# Patient Record
Sex: Female | Born: 1935 | Race: White | Hispanic: No | Smoking: Never smoker
Health system: Southern US, Community
[De-identification: ages and names within clinical notes are randomized; demographics above are authoritative.]

## PROBLEM LIST (undated history)

## (undated) DIAGNOSIS — H409 Unspecified glaucoma: Secondary | ICD-10-CM

## (undated) DIAGNOSIS — I219 Acute myocardial infarction, unspecified: Secondary | ICD-10-CM

## (undated) DIAGNOSIS — I639 Cerebral infarction, unspecified: Secondary | ICD-10-CM

## (undated) DIAGNOSIS — K219 Gastro-esophageal reflux disease without esophagitis: Secondary | ICD-10-CM

## (undated) DIAGNOSIS — G4730 Sleep apnea, unspecified: Secondary | ICD-10-CM

## (undated) DIAGNOSIS — R011 Cardiac murmur, unspecified: Secondary | ICD-10-CM

## (undated) DIAGNOSIS — F0280 Dementia in other diseases classified elsewhere without behavioral disturbance: Secondary | ICD-10-CM

## (undated) DIAGNOSIS — H269 Unspecified cataract: Secondary | ICD-10-CM

## (undated) DIAGNOSIS — M1990 Unspecified osteoarthritis, unspecified site: Secondary | ICD-10-CM

## (undated) DIAGNOSIS — R569 Unspecified convulsions: Secondary | ICD-10-CM

## (undated) DIAGNOSIS — T7840XA Allergy, unspecified, initial encounter: Secondary | ICD-10-CM

## (undated) HISTORY — PX: BREAST BIOPSY: SHX20

## (undated) HISTORY — DX: Acute myocardial infarction, unspecified: I21.9

## (undated) HISTORY — DX: Gastro-esophageal reflux disease without esophagitis: K21.9

## (undated) HISTORY — DX: Sleep apnea, unspecified: G47.30

## (undated) HISTORY — DX: Unspecified osteoarthritis, unspecified site: M19.90

## (undated) HISTORY — DX: Cerebral infarction, unspecified: I63.9

## (undated) HISTORY — DX: Allergy, unspecified, initial encounter: T78.40XA

## (undated) HISTORY — DX: Unspecified glaucoma: H40.9

## (undated) HISTORY — DX: Unspecified cataract: H26.9

## (undated) HISTORY — DX: Unspecified convulsions: R56.9

## (undated) HISTORY — DX: Cardiac murmur, unspecified: R01.1

---

## 2000-01-15 ENCOUNTER — Other Ambulatory Visit: Admission: RE | Admit: 2000-01-15 | Discharge: 2000-01-15 | Payer: Self-pay | Admitting: Podiatry

## 2004-12-27 ENCOUNTER — Ambulatory Visit: Payer: Self-pay | Admitting: Internal Medicine

## 2005-04-16 ENCOUNTER — Ambulatory Visit: Payer: Self-pay | Admitting: Otolaryngology

## 2005-12-31 ENCOUNTER — Ambulatory Visit: Payer: Self-pay | Admitting: Internal Medicine

## 2006-06-04 ENCOUNTER — Other Ambulatory Visit: Payer: Self-pay

## 2006-06-04 ENCOUNTER — Emergency Department: Payer: Self-pay | Admitting: Emergency Medicine

## 2006-06-05 ENCOUNTER — Ambulatory Visit: Payer: Self-pay | Admitting: Internal Medicine

## 2006-06-26 ENCOUNTER — Ambulatory Visit: Payer: Self-pay | Admitting: Internal Medicine

## 2006-12-16 ENCOUNTER — Ambulatory Visit: Payer: Self-pay | Admitting: Otolaryngology

## 2007-01-16 ENCOUNTER — Ambulatory Visit: Payer: Self-pay | Admitting: Internal Medicine

## 2007-02-05 ENCOUNTER — Ambulatory Visit: Payer: Self-pay | Admitting: Internal Medicine

## 2007-02-11 ENCOUNTER — Ambulatory Visit: Payer: Self-pay | Admitting: Internal Medicine

## 2008-02-23 ENCOUNTER — Ambulatory Visit: Payer: Self-pay | Admitting: Internal Medicine

## 2008-06-08 ENCOUNTER — Emergency Department: Payer: Self-pay | Admitting: Emergency Medicine

## 2009-03-09 ENCOUNTER — Ambulatory Visit: Payer: Self-pay | Admitting: Internal Medicine

## 2010-03-13 ENCOUNTER — Ambulatory Visit: Payer: Self-pay | Admitting: Internal Medicine

## 2011-04-16 ENCOUNTER — Ambulatory Visit: Payer: Self-pay | Admitting: Internal Medicine

## 2011-12-04 ENCOUNTER — Ambulatory Visit: Payer: Self-pay | Admitting: Ophthalmology

## 2012-01-22 ENCOUNTER — Ambulatory Visit: Payer: Self-pay | Admitting: Gastroenterology

## 2012-04-08 ENCOUNTER — Ambulatory Visit: Payer: Self-pay | Admitting: Ophthalmology

## 2012-04-16 ENCOUNTER — Ambulatory Visit: Payer: Self-pay | Admitting: Internal Medicine

## 2013-04-19 ENCOUNTER — Ambulatory Visit: Payer: Self-pay | Admitting: Internal Medicine

## 2013-05-06 ENCOUNTER — Ambulatory Visit: Payer: Self-pay | Admitting: Gastroenterology

## 2013-07-30 ENCOUNTER — Ambulatory Visit: Payer: Self-pay | Admitting: Internal Medicine

## 2013-08-02 ENCOUNTER — Ambulatory Visit: Payer: Self-pay | Admitting: Gastroenterology

## 2013-08-05 LAB — PATHOLOGY REPORT

## 2014-01-12 ENCOUNTER — Ambulatory Visit: Payer: Self-pay | Admitting: Internal Medicine

## 2014-04-20 ENCOUNTER — Ambulatory Visit: Payer: Self-pay | Admitting: Internal Medicine

## 2014-05-06 ENCOUNTER — Ambulatory Visit: Payer: Self-pay | Admitting: Gastroenterology

## 2014-07-22 ENCOUNTER — Ambulatory Visit
Admit: 2014-07-22 | Disposition: A | Discharge status: Home / Self Care | Payer: Self-pay | Attending: Internal Medicine | Admitting: Internal Medicine

## 2014-08-09 ENCOUNTER — Other Ambulatory Visit: Payer: Self-pay | Admitting: Nurse Practitioner

## 2014-08-09 DIAGNOSIS — K219 Gastro-esophageal reflux disease without esophagitis: Secondary | ICD-10-CM

## 2014-08-24 ENCOUNTER — Ambulatory Visit
Admission: RE | Admit: 2014-08-24 | Discharge: 2014-08-24 | Disposition: A | Discharge status: Home / Self Care | Payer: PPO | Source: Ambulatory Visit | Attending: Nurse Practitioner | Admitting: Nurse Practitioner

## 2014-08-24 DIAGNOSIS — K219 Gastro-esophageal reflux disease without esophagitis: Secondary | ICD-10-CM

## 2014-08-24 DIAGNOSIS — K449 Diaphragmatic hernia without obstruction or gangrene: Secondary | ICD-10-CM | POA: Insufficient documentation

## 2014-08-24 DIAGNOSIS — R0989 Other specified symptoms and signs involving the circulatory and respiratory systems: Secondary | ICD-10-CM | POA: Diagnosis present

## 2015-03-23 ENCOUNTER — Other Ambulatory Visit: Payer: Self-pay | Admitting: Internal Medicine

## 2015-03-23 DIAGNOSIS — Z1231 Encounter for screening mammogram for malignant neoplasm of breast: Secondary | ICD-10-CM

## 2015-04-21 DIAGNOSIS — Z79899 Other long term (current) drug therapy: Secondary | ICD-10-CM | POA: Diagnosis not present

## 2015-04-21 DIAGNOSIS — N390 Urinary tract infection, site not specified: Secondary | ICD-10-CM | POA: Diagnosis not present

## 2015-04-21 DIAGNOSIS — E7800 Pure hypercholesterolemia, unspecified: Secondary | ICD-10-CM | POA: Diagnosis not present

## 2015-04-21 DIAGNOSIS — I10 Essential (primary) hypertension: Secondary | ICD-10-CM | POA: Diagnosis not present

## 2015-04-21 DIAGNOSIS — R739 Hyperglycemia, unspecified: Secondary | ICD-10-CM | POA: Diagnosis not present

## 2015-04-24 ENCOUNTER — Ambulatory Visit
Admission: RE | Admit: 2015-04-24 | Discharge: 2015-04-24 | Disposition: A | Discharge status: Home / Self Care | Payer: PPO | Source: Ambulatory Visit | Attending: Internal Medicine | Admitting: Internal Medicine

## 2015-04-24 DIAGNOSIS — Z1231 Encounter for screening mammogram for malignant neoplasm of breast: Secondary | ICD-10-CM | POA: Diagnosis not present

## 2015-07-22 DIAGNOSIS — J302 Other seasonal allergic rhinitis: Secondary | ICD-10-CM | POA: Diagnosis not present

## 2015-08-07 DIAGNOSIS — H401132 Primary open-angle glaucoma, bilateral, moderate stage: Secondary | ICD-10-CM | POA: Diagnosis not present

## 2015-08-14 DIAGNOSIS — H401132 Primary open-angle glaucoma, bilateral, moderate stage: Secondary | ICD-10-CM | POA: Diagnosis not present

## 2015-08-18 DIAGNOSIS — I788 Other diseases of capillaries: Secondary | ICD-10-CM | POA: Diagnosis not present

## 2015-08-18 DIAGNOSIS — L728 Other follicular cysts of the skin and subcutaneous tissue: Secondary | ICD-10-CM | POA: Diagnosis not present

## 2015-08-18 DIAGNOSIS — L720 Epidermal cyst: Secondary | ICD-10-CM | POA: Diagnosis not present

## 2015-08-30 DIAGNOSIS — R55 Syncope and collapse: Secondary | ICD-10-CM | POA: Diagnosis not present

## 2015-08-30 DIAGNOSIS — I10 Essential (primary) hypertension: Secondary | ICD-10-CM | POA: Diagnosis not present

## 2015-08-30 DIAGNOSIS — Z8679 Personal history of other diseases of the circulatory system: Secondary | ICD-10-CM | POA: Diagnosis not present

## 2015-08-30 DIAGNOSIS — E7800 Pure hypercholesterolemia, unspecified: Secondary | ICD-10-CM | POA: Diagnosis not present

## 2015-09-04 DIAGNOSIS — R55 Syncope and collapse: Secondary | ICD-10-CM | POA: Diagnosis not present

## 2015-09-12 DIAGNOSIS — R55 Syncope and collapse: Secondary | ICD-10-CM | POA: Diagnosis not present

## 2015-09-13 DIAGNOSIS — Z1329 Encounter for screening for other suspected endocrine disorder: Secondary | ICD-10-CM | POA: Diagnosis not present

## 2015-09-13 DIAGNOSIS — K589 Irritable bowel syndrome without diarrhea: Secondary | ICD-10-CM | POA: Diagnosis not present

## 2015-09-13 DIAGNOSIS — K219 Gastro-esophageal reflux disease without esophagitis: Secondary | ICD-10-CM | POA: Diagnosis not present

## 2015-09-13 DIAGNOSIS — R0602 Shortness of breath: Secondary | ICD-10-CM | POA: Diagnosis not present

## 2015-09-13 DIAGNOSIS — R0600 Dyspnea, unspecified: Secondary | ICD-10-CM | POA: Diagnosis not present

## 2015-09-13 DIAGNOSIS — R42 Dizziness and giddiness: Secondary | ICD-10-CM | POA: Diagnosis not present

## 2015-09-13 DIAGNOSIS — Z79899 Other long term (current) drug therapy: Secondary | ICD-10-CM | POA: Diagnosis not present

## 2015-09-13 DIAGNOSIS — I10 Essential (primary) hypertension: Secondary | ICD-10-CM | POA: Diagnosis not present

## 2015-09-13 DIAGNOSIS — M545 Low back pain: Secondary | ICD-10-CM | POA: Diagnosis not present

## 2015-09-13 DIAGNOSIS — R739 Hyperglycemia, unspecified: Secondary | ICD-10-CM | POA: Diagnosis not present

## 2015-09-13 DIAGNOSIS — E7800 Pure hypercholesterolemia, unspecified: Secondary | ICD-10-CM | POA: Diagnosis not present

## 2015-11-02 DIAGNOSIS — K219 Gastro-esophageal reflux disease without esophagitis: Secondary | ICD-10-CM | POA: Diagnosis not present

## 2015-11-02 DIAGNOSIS — K5909 Other constipation: Secondary | ICD-10-CM | POA: Diagnosis not present

## 2015-12-05 DIAGNOSIS — L304 Erythema intertrigo: Secondary | ICD-10-CM | POA: Diagnosis not present

## 2015-12-05 DIAGNOSIS — L2089 Other atopic dermatitis: Secondary | ICD-10-CM | POA: Diagnosis not present

## 2015-12-14 DIAGNOSIS — Z8719 Personal history of other diseases of the digestive system: Secondary | ICD-10-CM | POA: Diagnosis not present

## 2015-12-14 DIAGNOSIS — K5909 Other constipation: Secondary | ICD-10-CM | POA: Diagnosis not present

## 2015-12-14 DIAGNOSIS — K219 Gastro-esophageal reflux disease without esophagitis: Secondary | ICD-10-CM | POA: Diagnosis not present

## 2015-12-19 DIAGNOSIS — L2089 Other atopic dermatitis: Secondary | ICD-10-CM | POA: Diagnosis not present

## 2015-12-19 DIAGNOSIS — L218 Other seborrheic dermatitis: Secondary | ICD-10-CM | POA: Diagnosis not present

## 2015-12-26 DIAGNOSIS — H10503 Unspecified blepharoconjunctivitis, bilateral: Secondary | ICD-10-CM | POA: Diagnosis not present

## 2016-01-15 DIAGNOSIS — H401132 Primary open-angle glaucoma, bilateral, moderate stage: Secondary | ICD-10-CM | POA: Diagnosis not present

## 2016-02-01 DIAGNOSIS — H1045 Other chronic allergic conjunctivitis: Secondary | ICD-10-CM | POA: Diagnosis not present

## 2016-02-01 DIAGNOSIS — L218 Other seborrheic dermatitis: Secondary | ICD-10-CM | POA: Diagnosis not present

## 2016-02-05 DIAGNOSIS — J301 Allergic rhinitis due to pollen: Secondary | ICD-10-CM | POA: Diagnosis not present

## 2016-02-05 DIAGNOSIS — H6123 Impacted cerumen, bilateral: Secondary | ICD-10-CM | POA: Diagnosis not present

## 2016-02-08 DIAGNOSIS — H1045 Other chronic allergic conjunctivitis: Secondary | ICD-10-CM | POA: Diagnosis not present

## 2016-02-08 DIAGNOSIS — L853 Xerosis cutis: Secondary | ICD-10-CM | POA: Diagnosis not present

## 2016-02-22 DIAGNOSIS — L853 Xerosis cutis: Secondary | ICD-10-CM | POA: Diagnosis not present

## 2016-02-22 DIAGNOSIS — H1045 Other chronic allergic conjunctivitis: Secondary | ICD-10-CM | POA: Diagnosis not present

## 2016-04-15 ENCOUNTER — Other Ambulatory Visit: Payer: Self-pay | Admitting: Internal Medicine

## 2016-04-15 DIAGNOSIS — E7800 Pure hypercholesterolemia, unspecified: Secondary | ICD-10-CM | POA: Diagnosis not present

## 2016-04-15 DIAGNOSIS — Z1231 Encounter for screening mammogram for malignant neoplasm of breast: Secondary | ICD-10-CM | POA: Diagnosis not present

## 2016-04-15 DIAGNOSIS — R739 Hyperglycemia, unspecified: Secondary | ICD-10-CM | POA: Diagnosis not present

## 2016-04-15 DIAGNOSIS — I10 Essential (primary) hypertension: Secondary | ICD-10-CM | POA: Diagnosis not present

## 2016-04-15 DIAGNOSIS — F4489 Other dissociative and conversion disorders: Secondary | ICD-10-CM | POA: Diagnosis not present

## 2016-04-15 DIAGNOSIS — Z79899 Other long term (current) drug therapy: Secondary | ICD-10-CM | POA: Diagnosis not present

## 2016-04-15 DIAGNOSIS — M2550 Pain in unspecified joint: Secondary | ICD-10-CM | POA: Diagnosis not present

## 2016-04-16 ENCOUNTER — Other Ambulatory Visit: Payer: Self-pay | Admitting: Internal Medicine

## 2016-04-16 DIAGNOSIS — Z1231 Encounter for screening mammogram for malignant neoplasm of breast: Secondary | ICD-10-CM

## 2016-04-24 ENCOUNTER — Ambulatory Visit: Payer: PPO

## 2016-05-03 ENCOUNTER — Ambulatory Visit: Payer: PPO

## 2016-05-08 ENCOUNTER — Ambulatory Visit
Admission: RE | Admit: 2016-05-08 | Discharge: 2016-05-08 | Disposition: A | Discharge status: Home / Self Care | Payer: PPO | Source: Ambulatory Visit | Attending: Internal Medicine | Admitting: Internal Medicine

## 2016-05-08 DIAGNOSIS — F4489 Other dissociative and conversion disorders: Secondary | ICD-10-CM | POA: Diagnosis not present

## 2016-05-08 DIAGNOSIS — I739 Peripheral vascular disease, unspecified: Secondary | ICD-10-CM | POA: Diagnosis not present

## 2016-05-08 DIAGNOSIS — R410 Disorientation, unspecified: Secondary | ICD-10-CM | POA: Diagnosis not present

## 2016-05-17 ENCOUNTER — Ambulatory Visit
Admission: RE | Admit: 2016-05-17 | Discharge: 2016-05-17 | Disposition: A | Discharge status: Home / Self Care | Payer: PPO | Source: Ambulatory Visit | Attending: Internal Medicine | Admitting: Internal Medicine

## 2016-05-17 DIAGNOSIS — Z1231 Encounter for screening mammogram for malignant neoplasm of breast: Secondary | ICD-10-CM | POA: Insufficient documentation

## 2016-06-12 DIAGNOSIS — K219 Gastro-esophageal reflux disease without esophagitis: Secondary | ICD-10-CM | POA: Diagnosis not present

## 2016-06-12 DIAGNOSIS — K5909 Other constipation: Secondary | ICD-10-CM | POA: Diagnosis not present

## 2016-07-11 DIAGNOSIS — H401132 Primary open-angle glaucoma, bilateral, moderate stage: Secondary | ICD-10-CM | POA: Diagnosis not present

## 2016-07-15 DIAGNOSIS — I10 Essential (primary) hypertension: Secondary | ICD-10-CM | POA: Diagnosis not present

## 2016-07-15 DIAGNOSIS — R739 Hyperglycemia, unspecified: Secondary | ICD-10-CM | POA: Diagnosis not present

## 2016-07-15 DIAGNOSIS — Z1329 Encounter for screening for other suspected endocrine disorder: Secondary | ICD-10-CM | POA: Diagnosis not present

## 2016-07-15 DIAGNOSIS — E538 Deficiency of other specified B group vitamins: Secondary | ICD-10-CM | POA: Diagnosis not present

## 2016-07-15 DIAGNOSIS — Z79899 Other long term (current) drug therapy: Secondary | ICD-10-CM | POA: Diagnosis not present

## 2016-07-15 DIAGNOSIS — E7800 Pure hypercholesterolemia, unspecified: Secondary | ICD-10-CM | POA: Diagnosis not present

## 2016-07-15 DIAGNOSIS — K589 Irritable bowel syndrome without diarrhea: Secondary | ICD-10-CM | POA: Diagnosis not present

## 2016-09-03 DIAGNOSIS — H353131 Nonexudative age-related macular degeneration, bilateral, early dry stage: Secondary | ICD-10-CM | POA: Diagnosis not present

## 2016-10-04 DIAGNOSIS — H029 Unspecified disorder of eyelid: Secondary | ICD-10-CM | POA: Diagnosis not present

## 2016-10-14 DIAGNOSIS — Z0000 Encounter for general adult medical examination without abnormal findings: Secondary | ICD-10-CM | POA: Diagnosis not present

## 2016-10-14 DIAGNOSIS — I10 Essential (primary) hypertension: Secondary | ICD-10-CM | POA: Diagnosis not present

## 2016-10-14 DIAGNOSIS — E7800 Pure hypercholesterolemia, unspecified: Secondary | ICD-10-CM | POA: Diagnosis not present

## 2016-10-14 DIAGNOSIS — Z79899 Other long term (current) drug therapy: Secondary | ICD-10-CM | POA: Diagnosis not present

## 2016-10-14 DIAGNOSIS — K589 Irritable bowel syndrome without diarrhea: Secondary | ICD-10-CM | POA: Diagnosis not present

## 2016-10-14 DIAGNOSIS — R739 Hyperglycemia, unspecified: Secondary | ICD-10-CM | POA: Diagnosis not present

## 2016-10-14 DIAGNOSIS — R8290 Unspecified abnormal findings in urine: Secondary | ICD-10-CM | POA: Diagnosis not present

## 2016-10-14 DIAGNOSIS — E538 Deficiency of other specified B group vitamins: Secondary | ICD-10-CM | POA: Diagnosis not present

## 2017-01-27 DIAGNOSIS — R55 Syncope and collapse: Secondary | ICD-10-CM | POA: Diagnosis not present

## 2017-01-27 DIAGNOSIS — Z0000 Encounter for general adult medical examination without abnormal findings: Secondary | ICD-10-CM | POA: Diagnosis not present

## 2017-01-27 DIAGNOSIS — I10 Essential (primary) hypertension: Secondary | ICD-10-CM | POA: Diagnosis not present

## 2017-01-27 DIAGNOSIS — E7800 Pure hypercholesterolemia, unspecified: Secondary | ICD-10-CM | POA: Diagnosis not present

## 2017-01-27 DIAGNOSIS — R739 Hyperglycemia, unspecified: Secondary | ICD-10-CM | POA: Diagnosis not present

## 2017-01-27 DIAGNOSIS — R270 Ataxia, unspecified: Secondary | ICD-10-CM | POA: Diagnosis not present

## 2017-01-27 DIAGNOSIS — Z79899 Other long term (current) drug therapy: Secondary | ICD-10-CM | POA: Diagnosis not present

## 2017-01-28 ENCOUNTER — Other Ambulatory Visit: Payer: Self-pay | Admitting: Internal Medicine

## 2017-01-28 DIAGNOSIS — R270 Ataxia, unspecified: Secondary | ICD-10-CM

## 2017-01-28 DIAGNOSIS — R55 Syncope and collapse: Secondary | ICD-10-CM

## 2017-02-06 ENCOUNTER — Ambulatory Visit
Admission: RE | Admit: 2017-02-06 | Discharge: 2017-02-06 | Disposition: A | Discharge status: Home / Self Care | Payer: PPO | Source: Ambulatory Visit | Attending: Internal Medicine | Admitting: Internal Medicine

## 2017-02-06 DIAGNOSIS — R270 Ataxia, unspecified: Secondary | ICD-10-CM | POA: Diagnosis not present

## 2017-02-06 DIAGNOSIS — R55 Syncope and collapse: Secondary | ICD-10-CM | POA: Diagnosis not present

## 2017-02-06 DIAGNOSIS — I6523 Occlusion and stenosis of bilateral carotid arteries: Secondary | ICD-10-CM | POA: Diagnosis not present

## 2017-02-17 DIAGNOSIS — J301 Allergic rhinitis due to pollen: Secondary | ICD-10-CM | POA: Diagnosis not present

## 2017-02-17 DIAGNOSIS — H6123 Impacted cerumen, bilateral: Secondary | ICD-10-CM | POA: Diagnosis not present

## 2017-02-21 DIAGNOSIS — R55 Syncope and collapse: Secondary | ICD-10-CM | POA: Diagnosis not present

## 2017-02-21 DIAGNOSIS — G4711 Idiopathic hypersomnia with long sleep time: Secondary | ICD-10-CM | POA: Diagnosis not present

## 2017-02-21 DIAGNOSIS — R2689 Other abnormalities of gait and mobility: Secondary | ICD-10-CM | POA: Diagnosis not present

## 2017-03-10 DIAGNOSIS — G4733 Obstructive sleep apnea (adult) (pediatric): Secondary | ICD-10-CM | POA: Diagnosis not present

## 2017-03-26 DIAGNOSIS — H401132 Primary open-angle glaucoma, bilateral, moderate stage: Secondary | ICD-10-CM | POA: Diagnosis not present

## 2017-03-28 DIAGNOSIS — R55 Syncope and collapse: Secondary | ICD-10-CM | POA: Diagnosis not present

## 2017-04-21 DIAGNOSIS — R55 Syncope and collapse: Secondary | ICD-10-CM | POA: Diagnosis not present

## 2017-04-21 DIAGNOSIS — R4189 Other symptoms and signs involving cognitive functions and awareness: Secondary | ICD-10-CM | POA: Diagnosis not present

## 2017-04-21 DIAGNOSIS — G4733 Obstructive sleep apnea (adult) (pediatric): Secondary | ICD-10-CM | POA: Diagnosis not present

## 2017-04-29 DIAGNOSIS — I10 Essential (primary) hypertension: Secondary | ICD-10-CM | POA: Diagnosis not present

## 2017-04-29 DIAGNOSIS — R739 Hyperglycemia, unspecified: Secondary | ICD-10-CM | POA: Diagnosis not present

## 2017-04-29 DIAGNOSIS — Z1231 Encounter for screening mammogram for malignant neoplasm of breast: Secondary | ICD-10-CM | POA: Diagnosis not present

## 2017-04-29 DIAGNOSIS — E7800 Pure hypercholesterolemia, unspecified: Secondary | ICD-10-CM | POA: Diagnosis not present

## 2017-04-29 DIAGNOSIS — G4733 Obstructive sleep apnea (adult) (pediatric): Secondary | ICD-10-CM | POA: Diagnosis not present

## 2017-04-29 DIAGNOSIS — Z79899 Other long term (current) drug therapy: Secondary | ICD-10-CM | POA: Diagnosis not present

## 2017-05-02 ENCOUNTER — Other Ambulatory Visit: Payer: Self-pay | Admitting: Internal Medicine

## 2017-05-02 DIAGNOSIS — Z1231 Encounter for screening mammogram for malignant neoplasm of breast: Secondary | ICD-10-CM

## 2017-05-08 DIAGNOSIS — L821 Other seborrheic keratosis: Secondary | ICD-10-CM | POA: Diagnosis not present

## 2017-05-08 DIAGNOSIS — I872 Venous insufficiency (chronic) (peripheral): Secondary | ICD-10-CM | POA: Diagnosis not present

## 2017-05-16 DIAGNOSIS — G4733 Obstructive sleep apnea (adult) (pediatric): Secondary | ICD-10-CM | POA: Diagnosis not present

## 2017-05-20 ENCOUNTER — Ambulatory Visit
Admission: RE | Admit: 2017-05-20 | Discharge: 2017-05-20 | Disposition: A | Discharge status: Home / Self Care | Payer: PPO | Source: Ambulatory Visit | Attending: Internal Medicine | Admitting: Internal Medicine

## 2017-05-20 DIAGNOSIS — Z1231 Encounter for screening mammogram for malignant neoplasm of breast: Secondary | ICD-10-CM | POA: Diagnosis not present

## 2017-06-13 DIAGNOSIS — G4733 Obstructive sleep apnea (adult) (pediatric): Secondary | ICD-10-CM | POA: Diagnosis not present

## 2017-06-17 DIAGNOSIS — K219 Gastro-esophageal reflux disease without esophagitis: Secondary | ICD-10-CM | POA: Diagnosis not present

## 2017-06-17 DIAGNOSIS — K5909 Other constipation: Secondary | ICD-10-CM | POA: Diagnosis not present

## 2017-07-14 DIAGNOSIS — G4733 Obstructive sleep apnea (adult) (pediatric): Secondary | ICD-10-CM | POA: Diagnosis not present

## 2017-07-22 DIAGNOSIS — E7800 Pure hypercholesterolemia, unspecified: Secondary | ICD-10-CM | POA: Diagnosis not present

## 2017-07-22 DIAGNOSIS — Z79899 Other long term (current) drug therapy: Secondary | ICD-10-CM | POA: Diagnosis not present

## 2017-07-22 DIAGNOSIS — R739 Hyperglycemia, unspecified: Secondary | ICD-10-CM | POA: Diagnosis not present

## 2017-07-22 DIAGNOSIS — I10 Essential (primary) hypertension: Secondary | ICD-10-CM | POA: Diagnosis not present

## 2017-07-29 DIAGNOSIS — Z0000 Encounter for general adult medical examination without abnormal findings: Secondary | ICD-10-CM | POA: Diagnosis not present

## 2017-07-29 DIAGNOSIS — R739 Hyperglycemia, unspecified: Secondary | ICD-10-CM | POA: Diagnosis not present

## 2017-07-29 DIAGNOSIS — E7800 Pure hypercholesterolemia, unspecified: Secondary | ICD-10-CM | POA: Diagnosis not present

## 2017-07-29 DIAGNOSIS — G2401 Drug induced subacute dyskinesia: Secondary | ICD-10-CM | POA: Diagnosis not present

## 2017-07-29 DIAGNOSIS — N393 Stress incontinence (female) (male): Secondary | ICD-10-CM | POA: Diagnosis not present

## 2017-07-29 DIAGNOSIS — I10 Essential (primary) hypertension: Secondary | ICD-10-CM | POA: Diagnosis not present

## 2017-08-13 DIAGNOSIS — G4733 Obstructive sleep apnea (adult) (pediatric): Secondary | ICD-10-CM | POA: Diagnosis not present

## 2017-08-19 DIAGNOSIS — R55 Syncope and collapse: Secondary | ICD-10-CM | POA: Diagnosis not present

## 2017-08-19 DIAGNOSIS — R2689 Other abnormalities of gait and mobility: Secondary | ICD-10-CM | POA: Diagnosis not present

## 2017-08-19 DIAGNOSIS — R4189 Other symptoms and signs involving cognitive functions and awareness: Secondary | ICD-10-CM | POA: Diagnosis not present

## 2017-08-19 DIAGNOSIS — G4733 Obstructive sleep apnea (adult) (pediatric): Secondary | ICD-10-CM | POA: Diagnosis not present

## 2017-09-08 ENCOUNTER — Ambulatory Visit: Payer: PPO | Admitting: Urology

## 2017-09-08 ENCOUNTER — Encounter: Payer: Self-pay | Admitting: Urology

## 2017-09-08 VITALS — BP 122/69 | HR 83 | Ht 61.0 in | Wt 160.9 lb

## 2017-09-08 DIAGNOSIS — R32 Unspecified urinary incontinence: Secondary | ICD-10-CM | POA: Diagnosis not present

## 2017-09-08 DIAGNOSIS — N3946 Mixed incontinence: Secondary | ICD-10-CM

## 2017-09-08 LAB — MICROSCOPIC EXAMINATION

## 2017-09-08 LAB — URINALYSIS, COMPLETE
Bilirubin, UA: NEGATIVE
GLUCOSE, UA: NEGATIVE
KETONES UA: NEGATIVE
NITRITE UA: NEGATIVE
Protein, UA: NEGATIVE
Specific Gravity, UA: 1.015 (ref 1.005–1.030)
UUROB: 0.2 mg/dL (ref 0.2–1.0)
pH, UA: 5 (ref 5.0–7.5)

## 2017-09-08 NOTE — Progress Notes (Signed)
09/08/2017 10:15 AM   Stacey Warner [birth year] [id number]  Referring provider: Marguarite Arbour, MD [address] [facility] [place], [place] [address]  Chief Complaint  Patient presents with  . Urinary Incontinence    HPI: Was consulted to assess the patient's worsening urinary incontinence over the last 12 months.  She is a partial responder to Myrbetriq 25 mg.  She describes another pill years ago that caused dry mouth.  She does not leak with coughing sneezing but has urge incontinence.  She wears 5 or 6 pads a day moderately wet.  She gets up sometimes once at night.  She voids every 1-2 hours during the day.  She does not have bedwetting  She has had a hysterectomy.  She is prone to constipation.  She denies a history of previous GU surgery kidney stones and bladder infections.  She has no neurologic issues  Modifying factors: There are no other modifying factors  Associated signs and symptoms: There are no other associated signs and symptoms Aggravating and relieving factors: There are no other aggravating or relieving factors Severity: Moderate Duration: Persistent   PMH: History reviewed. No pertinent past medical history.  Surgical History: Past Surgical History:  Procedure Laterality Date  . BREAST BIOPSY Right YRS AGO   CORE W/CLIP X 2 - NEG    Home Medications:  Allergies as of 09/08/2017      Reactions   Amoxicillin-pot Clavulanate Rash   Ciprofloxacin    Other reaction(s): Unknown Pt doesn't remember   Clindamycin    Other reaction(s): Unknown   Minocycline    Other reaction(s): Unknown Pt states made her feel "funny"   Sucralfate    Other reaction(s): Other (See Comments) "can't take"   Sulfa Antibiotics Rash   Had a small area of rash about 30 years ago when she took sulfa, has not taken since.   Tape Rash      Medication List        Accurate as of 09/08/17 10:15 AM. Always use your most recent med list.            atorvastatin 10 MG tablet Commonly known as:  LIPITOR Take 10 mg by mouth daily.   azelastine 0.1 % nasal spray Commonly known as:  ASTELIN   cyclobenzaprine 5 MG tablet Commonly known as:  FLEXERIL TAKE 1 TABLET BY MOUTH EVERY DAY AT NIGHT   dorzolamide-timolol 22.3-6.8 MG/ML ophthalmic solution Commonly known as:  COSOPT   fluticasone 50 MCG/ACT nasal spray Commonly known as:  FLONASE   latanoprost 0.005 % ophthalmic solution Commonly known as:  XALATAN   montelukast 10 MG tablet Commonly known as:  SINGULAIR TAKE ONE TABLET DAILY EACH EVENING PRIOR TO BEDTIME   MYRBETRIQ 25 MG Tb24 tablet Generic drug:  mirabegron ER Take 25 mg by mouth daily.   pantoprazole 40 MG tablet Commonly known as:  PROTONIX TAKE 1 TABLET BY MOUTH TWICE A DAY   ranitidine 300 MG capsule Commonly known as:  ZANTAC Take by mouth.       Allergies:  Allergies  Allergen Reactions  . Amoxicillin-Pot Clavulanate Rash  . Ciprofloxacin     Other reaction(s): Unknown Pt doesn't remember  . Clindamycin     Other reaction(s): Unknown  . Minocycline     Other reaction(s): Unknown Pt states made her feel "funny"  . Sucralfate     Other reaction(s): Other (See Comments) "can't take"  . Sulfa Antibiotics Rash    Had a  small area of rash about 30 years ago when she took sulfa, has not taken since.  . Tape Rash    Family History: Family History  Problem Relation Age of Onset  . Breast cancer Neg Hx     Social History:  has no tobacco, alcohol, and drug history on file.  ROS:                                        Physical Exam: There were no vitals taken for this visit.  Constitutional:  Alert and oriented, No acute distress. HEENT: [place] AT, moist mucus membranes.  Trachea midline, no masses. Cardiovascular: No clubbing, cyanosis, or edema. Respiratory: Normal respiratory effort, no increased work of breathing. GI: Abdomen is soft, nontender, nondistended,  no abdominal masses GU: Grade 2 hypermobility the bladder neck and no stress incontinence or prolapse Skin: No rashes, bruises or suspicious lesions. Lymph: No cervical or inguinal adenopathy. Neurologic: Grossly intact, no focal deficits, moving all 4 extremities. Psychiatric: Normal mood and affect.  Laboratory Data:  Urinalysis No results found for: COLORURINE, APPEARANCEUR, LABSPEC, PHURINE, GLUCOSEU, HGBUR, BILIRUBINUR, KETONESUR, PROTEINUR, UROBILINOGEN, NITRITE, LEUKOCYTESUR  Pertinent Imaging:   Assessment & Plan: The patient has urge incontinence and mild frequency and nocturia.  The role of cystoscopy discussed.  I will have the patient come back for a cystoscopy on Myrbetriq 50 mg samples.  A third antimuscarinic would be very reasonable as are some of the neuromodulation treatment.  I will call if the urine culture is positive  1. Urinary incontinence, unspecified type  - Urinalysis, Complete   No follow-ups on file.  Martina SinnerMACDIARMID,Elsy Chiang A, MD  [facility] [address], [address] [place], [place] [address] [phone]

## 2017-09-10 LAB — CULTURE, URINE COMPREHENSIVE

## 2017-09-13 DIAGNOSIS — G4733 Obstructive sleep apnea (adult) (pediatric): Secondary | ICD-10-CM | POA: Diagnosis not present

## 2017-09-22 DIAGNOSIS — H401132 Primary open-angle glaucoma, bilateral, moderate stage: Secondary | ICD-10-CM | POA: Diagnosis not present

## 2017-09-29 DIAGNOSIS — H401132 Primary open-angle glaucoma, bilateral, moderate stage: Secondary | ICD-10-CM | POA: Diagnosis not present

## 2017-10-13 ENCOUNTER — Encounter: Payer: Self-pay | Admitting: Urology

## 2017-10-13 ENCOUNTER — Ambulatory Visit: Payer: PPO | Admitting: Urology

## 2017-10-13 VITALS — BP 128/74 | HR 77 | Ht 61.0 in | Wt 161.2 lb

## 2017-10-13 DIAGNOSIS — N3020 Other chronic cystitis without hematuria: Secondary | ICD-10-CM | POA: Diagnosis not present

## 2017-10-13 DIAGNOSIS — R32 Unspecified urinary incontinence: Secondary | ICD-10-CM | POA: Diagnosis not present

## 2017-10-13 DIAGNOSIS — N3946 Mixed incontinence: Secondary | ICD-10-CM | POA: Diagnosis not present

## 2017-10-13 LAB — MICROSCOPIC EXAMINATION: RBC MICROSCOPIC, UA: NONE SEEN /HPF (ref 0–2)

## 2017-10-13 LAB — URINALYSIS, COMPLETE
Bilirubin, UA: NEGATIVE
Glucose, UA: NEGATIVE
KETONES UA: NEGATIVE
NITRITE UA: NEGATIVE
PH UA: 5 (ref 5.0–7.5)
Protein, UA: NEGATIVE
RBC, UA: NEGATIVE
Specific Gravity, UA: >1.03 — ABNORMAL HIGH (ref 1.005–1.030)
Urobilinogen, Ur: 0.2 mg/dL (ref 0.2–1.0)

## 2017-10-13 MED ORDER — CIPROFLOXACIN HCL 500 MG PO TABS
500.0000 mg | ORAL_TABLET | Freq: Once | ORAL | Status: AC
  Administered 2017-10-13: 500 mg via ORAL

## 2017-10-13 MED ORDER — LIDOCAINE HCL URETHRAL/MUCOSAL 2 % EX GEL
1.0000 "application " | Freq: Once | CUTANEOUS | Status: AC
  Administered 2017-10-13: 1 via URETHRAL

## 2017-10-13 MED ORDER — MIRABEGRON ER 50 MG PO TB24: 50.0000 mg | ORAL_TABLET | Freq: Every day | ORAL | 11 refills | Status: DC

## 2017-10-13 NOTE — Progress Notes (Signed)
10/13/2017 10:22 AM   Gigi GinPeggy Fuller SongA Fenlon 02-Oct-1932 [id number]  Referring provider: Marguarite ArbourSparks, Jeffrey D, MD [address] [facility] [place], [place] [id number]  Chief Complaint  Patient presents with  . Cysto    HPI: Was consulted to assess the patient's worsening urinary incontinence over the last 12 months.  She is a partial responder to Myrbetriq 25 mg.  She describes another pill years ago that caused dry mouth.  She does not leak with coughing sneezing but has urge incontinence.  She wears 5 or 6 pads a day moderately wet.  She gets up sometimes once at night.  She voids every 1-2 hours during the day.  She does not have bedwetting  GU: Grade 2 hypermobility the bladder neck and no stress incontinence or prolapse  The patient has urge incontinence and mild frequency and nocturia.  The role of cystoscopy discussed.  I will have the patient come back for a cystoscopy on Myrbetriq 50 mg samples.  A third antimuscarinic would be very reasonable as are some of the neuromodulation treatment.  I will call if the urine culture is positive  Today Urine culture negative.  Frequency stable. Incontinence moderately improved on the higher dosage of Myrbetriq  Cystoscopy: Utilizing sterile technique patient underwent flexible cystoscopy.  Bladder mucosa and trigone were normal.  There is no stitch foreign body or carcinoma.  There is no cystitis.  PMH: No past medical history on file.  Surgical History: Past Surgical History:  Procedure Laterality Date  . BREAST BIOPSY Right YRS AGO   CORE W/CLIP X 2 - NEG    Home Medications:  Allergies as of 10/13/2017      Reactions   Amoxicillin-pot Clavulanate Rash   Ciprofloxacin    Other reaction(s): Unknown Pt doesn't remember   Clindamycin    Other reaction(s): Unknown   Minocycline    Other reaction(s): Unknown Pt states made her feel "funny"   Sucralfate    Other reaction(s): Other (See Comments) "can't take"   Sulfa Antibiotics Rash   Had a small area of rash about 30 years ago when she took sulfa, has not taken since.   Tape Rash      Medication List        Accurate as of 10/13/17 10:22 AM. Always use your most recent med list.          aspirin EC 81 MG tablet Take 81 mg by mouth daily.   atorvastatin 10 MG tablet Commonly known as:  LIPITOR Take 10 mg by mouth daily.   azelastine 0.1 % nasal spray Commonly known as:  ASTELIN   cyclobenzaprine 5 MG tablet Commonly known as:  FLEXERIL TAKE 1 TABLET BY MOUTH EVERY DAY AT NIGHT   dorzolamide-timolol 22.3-6.8 MG/ML ophthalmic solution Commonly known as:  COSOPT   fluticasone 50 MCG/ACT nasal spray Commonly known as:  FLONASE   latanoprost 0.005 % ophthalmic solution Commonly known as:  XALATAN   MIRALAX PO Take by mouth.   montelukast 10 MG tablet Commonly known as:  SINGULAIR TAKE ONE TABLET DAILY EACH EVENING PRIOR TO BEDTIME   MYRBETRIQ 25 MG Tb24 tablet Generic drug:  mirabegron ER Take 25 mg by mouth daily.   pantoprazole 40 MG tablet Commonly known as:  PROTONIX TAKE 1 TABLET BY MOUTH TWICE A DAY   ranitidine 300 MG capsule Commonly known as:  ZANTAC Take by mouth.       Allergies:  Allergies  Allergen Reactions  . Amoxicillin-Pot Clavulanate  Rash  . Ciprofloxacin     Other reaction(s): Unknown Pt doesn't remember  . Clindamycin     Other reaction(s): Unknown  . Minocycline     Other reaction(s): Unknown Pt states made her feel "funny"  . Sucralfate     Other reaction(s): Other (See Comments) "can't take"  . Sulfa Antibiotics Rash    Had a small area of rash about 30 years ago when she took sulfa, has not taken since.  . Tape Rash    Family History: Family History  Problem Relation Age of Onset  . Breast cancer Neg Hx     Social History:  reports that she has never smoked. She has never used smokeless tobacco. She reports that she does not drink alcohol or use drugs.  ROS:                                          Physical Exam: BP 128/74 (BP Location: Left Arm, Patient Position: Sitting, Cuff Size: Normal)   Pulse 77   Ht 5\' 1"  (1.549 m)   Wt 161 lb 3.2 oz (73.1 kg)   BMI 30.46 kg/m   Constitutional:  Alert and oriented, No acute distress.  Laboratory Data: No results found for: WBC, HGB, HCT, MCV, PLT  No results found for: CREATININE  No results found for: PSA  No results found for: TESTOSTERONE  No results found for: HGBA1C  Urinalysis    Component Value Date/Time   APPEARANCEUR Cloudy (A) 09/08/2017 1011   GLUCOSEU Negative 09/08/2017 1011   BILIRUBINUR Negative 09/08/2017 1011   PROTEINUR Negative 09/08/2017 1011   NITRITE Negative 09/08/2017 1011   LEUKOCYTESUR 1+ (A) 09/08/2017 1011    Pertinent Imaging:   Assessment & Plan: Reassess on Myrbetriq in 3 months  1. Urinary incontinence, unspecified type  - Urinalysis, Complete - lidocaine (XYLOCAINE) 2 % jelly 1 application - ciprofloxacin (CIPRO) tablet 500 mg   No follow-ups on file.  Martina Sinner, MD  [facility] [address], [address] [place], [place] [address] [phone]

## 2017-11-04 DIAGNOSIS — Z79899 Other long term (current) drug therapy: Secondary | ICD-10-CM | POA: Diagnosis not present

## 2017-11-04 DIAGNOSIS — R739 Hyperglycemia, unspecified: Secondary | ICD-10-CM | POA: Diagnosis not present

## 2017-11-04 DIAGNOSIS — G4733 Obstructive sleep apnea (adult) (pediatric): Secondary | ICD-10-CM | POA: Diagnosis not present

## 2017-11-04 DIAGNOSIS — E7800 Pure hypercholesterolemia, unspecified: Secondary | ICD-10-CM | POA: Diagnosis not present

## 2017-11-04 DIAGNOSIS — I10 Essential (primary) hypertension: Secondary | ICD-10-CM | POA: Diagnosis not present

## 2017-11-04 DIAGNOSIS — R252 Cramp and spasm: Secondary | ICD-10-CM | POA: Diagnosis not present

## 2017-11-04 DIAGNOSIS — R8290 Unspecified abnormal findings in urine: Secondary | ICD-10-CM | POA: Diagnosis not present

## 2017-11-18 DIAGNOSIS — G4733 Obstructive sleep apnea (adult) (pediatric): Secondary | ICD-10-CM | POA: Diagnosis not present

## 2018-01-19 ENCOUNTER — Encounter: Payer: Self-pay | Admitting: Urology

## 2018-01-19 ENCOUNTER — Ambulatory Visit: Payer: PPO | Admitting: Urology

## 2018-01-19 VITALS — BP 130/71 | HR 86 | Ht 61.0 in | Wt 158.0 lb

## 2018-01-19 DIAGNOSIS — N3946 Mixed incontinence: Secondary | ICD-10-CM | POA: Diagnosis not present

## 2018-01-19 MED ORDER — TOLTERODINE TARTRATE ER 4 MG PO CP24: 4.0000 mg | ORAL_CAPSULE | Freq: Every day | ORAL | 11 refills | Status: DC

## 2018-01-19 NOTE — Progress Notes (Signed)
01/19/2018 9:39 AM   Stacey Warner 1932/06/06 [id number]  Referring provider: Marguarite Arbour, MD [address] [facility] [place], [place] [address]  Chief Complaint  Patient presents with  . Urinary Incontinence    3 month follow up    HPI: I was consulted to assess the patient's worsening urinary incontinence over the last 12 months. She is a partial responder to Myrbetriq 25 mg. She describes another pill years ago that caused dry mouth.  She does not leak with coughing sneezing but has urge incontinence. She wears 5 or 6 pads a day moderately wet. She gets up sometimes once at night. She voids every 1-2 hours during the day. She does not have bedwetting  XB:JYNWG 2 hypermobility the bladder neck and no stress incontinence or prolapse Normal cystoscopy  The patient has urge incontinence and mild frequency and nocturia.  Incontinence moderately improved on the higher dosage of Myrbetriq  Today She stable.  50% better.  Less urge incontinence.  Clinically not infected   PMH: No past medical history on file.  Surgical History: Past Surgical History:  Procedure Laterality Date  . BREAST BIOPSY Right YRS AGO   CORE W/CLIP X 2 - NEG    Home Medications:  Allergies as of 01/19/2018      Reactions   Amoxicillin-pot Clavulanate Rash   Ciprofloxacin    Other reaction(s): Unknown Pt doesn't remember   Clindamycin    Other reaction(s): Unknown   Minocycline    Other reaction(s): Unknown Pt states made her feel "funny"   Sucralfate    Other reaction(s): Other (See Comments) "can't take"   Sulfa Antibiotics Rash   Had a small area of rash about 30 years ago when she took sulfa, has not taken since.   Tape Rash      Medication List        Accurate as of 01/19/18  9:39 AM. Always use your most recent med list.          aspirin EC 81 MG tablet Take 81 mg by mouth daily.   atorvastatin 10 MG tablet Commonly known as:   LIPITOR Take 10 mg by mouth daily.   azelastine 0.1 % nasal spray Commonly known as:  ASTELIN   cyclobenzaprine 5 MG tablet Commonly known as:  FLEXERIL TAKE 1 TABLET BY MOUTH EVERY DAY AT NIGHT   dorzolamide-timolol 22.3-6.8 MG/ML ophthalmic solution Commonly known as:  COSOPT   fluticasone 50 MCG/ACT nasal spray Commonly known as:  FLONASE   latanoprost 0.005 % ophthalmic solution Commonly known as:  XALATAN   mirabegron ER 50 MG Tb24 tablet Commonly known as:  MYRBETRIQ Take 1 tablet (50 mg total) by mouth daily.   MIRALAX PO Take by mouth.   montelukast 10 MG tablet Commonly known as:  SINGULAIR TAKE ONE TABLET DAILY EACH EVENING PRIOR TO BEDTIME   pantoprazole 40 MG tablet Commonly known as:  PROTONIX TAKE 1 TABLET BY MOUTH TWICE A DAY   ranitidine 300 MG capsule Commonly known as:  ZANTAC Take by mouth.   tolterodine 4 MG 24 hr capsule Commonly known as:  DETROL LA Take 1 capsule (4 mg total) by mouth daily.       Allergies:  Allergies  Allergen Reactions  . Amoxicillin-Pot Clavulanate Rash  . Ciprofloxacin     Other reaction(s): Unknown Pt doesn't remember  . Clindamycin     Other reaction(s): Unknown  . Minocycline     Other reaction(s): Unknown Pt  states made her feel "funny"  . Sucralfate     Other reaction(s): Other (See Comments) "can't take"  . Sulfa Antibiotics Rash    Had a small area of rash about 30 years ago when she took sulfa, has not taken since.  . Tape Rash    Family History: Family History  Problem Relation Age of Onset  . Breast cancer Neg Hx     Social History:  reports that she has never smoked. She has never used smokeless tobacco. She reports that she does not drink alcohol or use drugs.  ROS: UROLOGY Frequent Urination?: Yes Hard to postpone urination?: Yes Burning/pain with urination?: No Get up at night to urinate?: Yes Leakage of urine?: Yes Urine stream starts and stops?: Yes Trouble starting stream?:  No Do you have to strain to urinate?: No Blood in urine?: No Urinary tract infection?: No Sexually transmitted disease?: No Injury to kidneys or bladder?: No Painful intercourse?: No Weak stream?: No Currently pregnant?: No Vaginal bleeding?: No Last menstrual period?: n  Gastrointestinal Nausea?: No Vomiting?: No Indigestion/heartburn?: No Diarrhea?: No Constipation?: No  Constitutional Fever: No Night sweats?: No Weight loss?: No Fatigue?: No  Skin Skin rash/lesions?: No Itching?: No  Eyes Blurred vision?: No Double vision?: No  Ears/Nose/Throat Sore throat?: No Sinus problems?: No  Hematologic/Lymphatic Swollen glands?: No Easy bruising?: No  Cardiovascular Leg swelling?: No Chest pain?: No  Respiratory Cough?: No Shortness of breath?: No  Endocrine Excessive thirst?: No  Musculoskeletal Back pain?: No Joint pain?: No  Neurological Headaches?: Yes Dizziness?: No  Psychologic Depression?: No Anxiety?: No  Physical Exam: BP 130/71   Pulse 86   Ht 5\' 1"  (1.549 m)   Wt 158 lb (71.7 kg)   BMI 29.85 kg/m    Laboratory Data: No results found for: WBC, HGB, HCT, MCV, PLT  No results found for: CREATININE  No results found for: PSA  No results found for: TESTOSTERONE  No results found for: HGBA1C  Urinalysis    Component Value Date/Time   APPEARANCEUR Cloudy (A) 10/13/2017 0947   GLUCOSEU Negative 10/13/2017 0947   BILIRUBINUR Negative 10/13/2017 0947   PROTEINUR Negative 10/13/2017 0947   NITRITE Negative 10/13/2017 0947   LEUKOCYTESUR Trace (A) 10/13/2017 0947    Pertinent Imaging:   Assessment & Plan: Patient would like to try Detrol LA 4 mg in combination of the Myrbetriq and reassess in 6 weeks.  I can always stop the Myrbetriq.  She is willing to set her goals high and try to get drier.  Percutaneous tibial nerve stimulation a good option moving forward  There are no diagnoses linked to this encounter.  No follow-ups  on file.  Martina Sinner, MD  [facility] [address] [place], [place] [address] [phone]

## 2018-01-20 DIAGNOSIS — G4733 Obstructive sleep apnea (adult) (pediatric): Secondary | ICD-10-CM | POA: Diagnosis not present

## 2018-02-04 DIAGNOSIS — R739 Hyperglycemia, unspecified: Secondary | ICD-10-CM | POA: Diagnosis not present

## 2018-02-04 DIAGNOSIS — G4733 Obstructive sleep apnea (adult) (pediatric): Secondary | ICD-10-CM | POA: Diagnosis not present

## 2018-02-04 DIAGNOSIS — Z79899 Other long term (current) drug therapy: Secondary | ICD-10-CM | POA: Diagnosis not present

## 2018-02-04 DIAGNOSIS — E7800 Pure hypercholesterolemia, unspecified: Secondary | ICD-10-CM | POA: Diagnosis not present

## 2018-02-04 DIAGNOSIS — I10 Essential (primary) hypertension: Secondary | ICD-10-CM | POA: Diagnosis not present

## 2018-02-23 DIAGNOSIS — R4189 Other symptoms and signs involving cognitive functions and awareness: Secondary | ICD-10-CM | POA: Diagnosis not present

## 2018-02-23 DIAGNOSIS — G4733 Obstructive sleep apnea (adult) (pediatric): Secondary | ICD-10-CM | POA: Diagnosis not present

## 2018-02-23 DIAGNOSIS — R51 Headache: Secondary | ICD-10-CM | POA: Diagnosis not present

## 2018-02-23 DIAGNOSIS — R55 Syncope and collapse: Secondary | ICD-10-CM | POA: Diagnosis not present

## 2018-02-23 DIAGNOSIS — R252 Cramp and spasm: Secondary | ICD-10-CM | POA: Diagnosis not present

## 2018-02-23 DIAGNOSIS — R2689 Other abnormalities of gait and mobility: Secondary | ICD-10-CM | POA: Diagnosis not present

## 2018-03-16 ENCOUNTER — Ambulatory Visit: Payer: Self-pay | Admitting: Urology

## 2018-03-16 DIAGNOSIS — J301 Allergic rhinitis due to pollen: Secondary | ICD-10-CM | POA: Diagnosis not present

## 2018-03-16 DIAGNOSIS — J069 Acute upper respiratory infection, unspecified: Secondary | ICD-10-CM | POA: Diagnosis not present

## 2018-03-16 DIAGNOSIS — H6123 Impacted cerumen, bilateral: Secondary | ICD-10-CM | POA: Diagnosis not present

## 2018-03-27 DIAGNOSIS — H6983 Other specified disorders of Eustachian tube, bilateral: Secondary | ICD-10-CM | POA: Diagnosis not present

## 2018-03-30 ENCOUNTER — Ambulatory Visit: Payer: Self-pay | Admitting: Urology

## 2018-03-30 DIAGNOSIS — H401121 Primary open-angle glaucoma, left eye, mild stage: Secondary | ICD-10-CM | POA: Diagnosis not present

## 2018-04-13 ENCOUNTER — Encounter: Payer: Self-pay | Admitting: Urology

## 2018-04-13 ENCOUNTER — Ambulatory Visit: Payer: PPO | Admitting: Urology

## 2018-04-13 VITALS — BP 142/82 | HR 82 | Ht 61.0 in | Wt 155.0 lb

## 2018-04-13 DIAGNOSIS — N3946 Mixed incontinence: Secondary | ICD-10-CM

## 2018-04-13 NOTE — Progress Notes (Signed)
04/13/2018 2:31 PM   Stacey Warner 11-28-1932 [id number]  Referring provider: Marguarite Arbour, MD [address] [facility] [place], [place] [address]  Chief Complaint  Patient presents with  . Mixed Incontinence    HPI: I was consulted to assess the patient's worsening urinary incontinence over the last 12 months. She is a partial responder to Myrbetriq 25 mg. She describes another pill years ago that caused dry mouth.  She does not leak with coughing sneezing but has urge incontinence. She wears 5 or 6 pads a day moderately wet. She gets up sometimes once at night. She voids every 1-2 hours during the day. She does not have bedwetting  QP:YPPJK 2 hypermobility the bladder neck and no stress incontinence or prolapse Normal cystoscopy  The patient has urge incontinence and mild frequency and nocturia.  Incontinence moderately improved on the higher dosage of Myrbetriq  She stable.  50% better.Patient would like to try Detrol LA 4 mg in combination of the Myrbetriq and reassess in 6 weeks.  I can always stop the Myrbetriq.  She is willing to set her goals high and try to get drier.  Percutaneous tibial nerve stimulation a good option moving forward  Today 's incontinence and frequency much improved on combination therapy.  Clinically not infected.    PMH: No past medical history on file.  Surgical History: Past Surgical History:  Procedure Laterality Date  . BREAST BIOPSY Right YRS AGO   CORE W/CLIP X 2 - NEG    Home Medications:  Allergies as of 04/13/2018      Reactions   Amoxicillin-pot Clavulanate Rash   Ciprofloxacin    Other reaction(s): Unknown Pt doesn't remember   Clindamycin    Other reaction(s): Unknown   Minocycline    Other reaction(s): Unknown Pt states made her feel "funny"   Sucralfate    Other reaction(s): Other (See Comments) "can't take"   Sulfa Antibiotics Rash   Had a small area of rash about 30 years ago  when she took sulfa, has not taken since.   Tape Rash      Medication List       Accurate as of April 13, 2018  2:31 PM. Always use your most recent med list.        aspirin EC 81 MG tablet Take 81 mg by mouth daily.   atorvastatin 10 MG tablet Commonly known as:  LIPITOR Take 10 mg by mouth daily.   azelastine 0.1 % nasal spray Commonly known as:  ASTELIN   CALCIUM + D PO Take by mouth.   cyclobenzaprine 5 MG tablet Commonly known as:  FLEXERIL TAKE 1 TABLET BY MOUTH EVERY DAY AT NIGHT   dorzolamide-timolol 22.3-6.8 MG/ML ophthalmic solution Commonly known as:  COSOPT   fluticasone 50 MCG/ACT nasal spray Commonly known as:  FLONASE   latanoprost 0.005 % ophthalmic solution Commonly known as:  XALATAN   mirabegron ER 50 MG Tb24 tablet Commonly known as:  MYRBETRIQ Take 1 tablet (50 mg total) by mouth daily.   MIRALAX PO Take by mouth.   montelukast 10 MG tablet Commonly known as:  SINGULAIR TAKE ONE TABLET DAILY EACH EVENING PRIOR TO BEDTIME   pantoprazole 40 MG tablet Commonly known as:  PROTONIX TAKE 1 TABLET BY MOUTH TWICE A DAY   ranitidine 300 MG capsule Commonly known as:  ZANTAC Take by mouth.   tolterodine 4 MG 24 hr capsule Commonly known as:  DETROL LA Take 1 capsule (  4 mg total) by mouth daily.       Allergies:  Allergies  Allergen Reactions  . Amoxicillin-Pot Clavulanate Rash  . Ciprofloxacin     Other reaction(s): Unknown Pt doesn't remember  . Clindamycin     Other reaction(s): Unknown  . Minocycline     Other reaction(s): Unknown Pt states made her feel "funny"  . Sucralfate     Other reaction(s): Other (See Comments) "can't take"  . Sulfa Antibiotics Rash    Had a small area of rash about 30 years ago when she took sulfa, has not taken since.  . Tape Rash    Family History: Family History  Problem Relation Age of Onset  . Breast cancer Neg Hx     Social History:  reports that she has never smoked. She has never  used smokeless tobacco. She reports that she does not drink alcohol or use drugs.  ROS: UROLOGY Frequent Urination?: No Hard to postpone urination?: Yes Burning/pain with urination?: No Get up at night to urinate?: No Leakage of urine?: No Urine stream starts and stops?: No Trouble starting stream?: No Do you have to strain to urinate?: No Blood in urine?: No Urinary tract infection?: No Sexually transmitted disease?: No Injury to kidneys or bladder?: No Painful intercourse?: No Weak stream?: No Currently pregnant?: No Vaginal bleeding?: No Last menstrual period?: n  Gastrointestinal Nausea?: No Vomiting?: No Indigestion/heartburn?: No Diarrhea?: No Constipation?: No  Constitutional Fever: No Night sweats?: No Weight loss?: No Fatigue?: No  Skin Skin rash/lesions?: No Itching?: No  Eyes Blurred vision?: No Double vision?: No  Ears/Nose/Throat Sore throat?: No Sinus problems?: No  Hematologic/Lymphatic Swollen glands?: No Easy bruising?: No  Cardiovascular Leg swelling?: No Chest pain?: No  Respiratory Cough?: No Shortness of breath?: Yes  Endocrine Excessive thirst?: Yes  Musculoskeletal Back pain?: No Joint pain?: No  Neurological Headaches?: No Dizziness?: No  Psychologic Depression?: No Anxiety?: No  Physical Exam: BP (!) 142/82 (BP Location: Left Arm, Patient Position: Sitting)   Pulse 82   Ht 5\' 1"  (1.549 m)   Wt 155 lb (70.3 kg)   BMI 29.29 kg/m   Constitutional:  Alert and oriented, No acute distress.   Laboratory Data: No results found for: WBC, HGB, HCT, MCV, PLT  No results found for: CREATININE  No results found for: PSA  No results found for: TESTOSTERONE  No results found for: HGBA1C  Urinalysis    Component Value Date/Time   APPEARANCEUR Cloudy (A) 10/13/2017 0947   GLUCOSEU Negative 10/13/2017 0947   BILIRUBINUR Negative 10/13/2017 0947   PROTEINUR Negative 10/13/2017 0947   NITRITE Negative  10/13/2017 0947   LEUKOCYTESUR Trace (A) 10/13/2017 0947    Pertinent Imaging:   Assessment & Plan: Reassess on double therapy in about 3 months.  Patient will stop Myrbetriq 1 to 2 weeks prior to see if it is benefiting her.  She felt short of breath but her vitals were normal and she looks fine but we both agreed that we would walk her over to the emergency room for evaluation  There are no diagnoses linked to this encounter.  No follow-ups on file.  Martina Sinner, MD  [facility] [address], [address] [place], [place] [address] [phone]

## 2018-04-15 DIAGNOSIS — R079 Chest pain, unspecified: Secondary | ICD-10-CM | POA: Diagnosis not present

## 2018-04-15 DIAGNOSIS — R0602 Shortness of breath: Secondary | ICD-10-CM | POA: Diagnosis not present

## 2018-04-15 DIAGNOSIS — I208 Other forms of angina pectoris: Secondary | ICD-10-CM | POA: Diagnosis not present

## 2018-04-15 DIAGNOSIS — I10 Essential (primary) hypertension: Secondary | ICD-10-CM | POA: Diagnosis not present

## 2018-04-15 DIAGNOSIS — E7800 Pure hypercholesterolemia, unspecified: Secondary | ICD-10-CM | POA: Diagnosis not present

## 2018-04-21 DIAGNOSIS — K219 Gastro-esophageal reflux disease without esophagitis: Secondary | ICD-10-CM | POA: Diagnosis not present

## 2018-04-21 DIAGNOSIS — K5909 Other constipation: Secondary | ICD-10-CM | POA: Diagnosis not present

## 2018-05-07 DIAGNOSIS — I10 Essential (primary) hypertension: Secondary | ICD-10-CM | POA: Diagnosis not present

## 2018-05-07 DIAGNOSIS — R739 Hyperglycemia, unspecified: Secondary | ICD-10-CM | POA: Diagnosis not present

## 2018-05-07 DIAGNOSIS — R8281 Pyuria: Secondary | ICD-10-CM | POA: Diagnosis not present

## 2018-05-07 DIAGNOSIS — E7800 Pure hypercholesterolemia, unspecified: Secondary | ICD-10-CM | POA: Diagnosis not present

## 2018-05-07 DIAGNOSIS — Z79899 Other long term (current) drug therapy: Secondary | ICD-10-CM | POA: Diagnosis not present

## 2018-05-12 DIAGNOSIS — Z0000 Encounter for general adult medical examination without abnormal findings: Secondary | ICD-10-CM | POA: Diagnosis not present

## 2018-05-12 DIAGNOSIS — E7800 Pure hypercholesterolemia, unspecified: Secondary | ICD-10-CM | POA: Diagnosis not present

## 2018-05-12 DIAGNOSIS — D649 Anemia, unspecified: Secondary | ICD-10-CM | POA: Diagnosis not present

## 2018-05-12 DIAGNOSIS — G4733 Obstructive sleep apnea (adult) (pediatric): Secondary | ICD-10-CM | POA: Diagnosis not present

## 2018-05-12 DIAGNOSIS — R739 Hyperglycemia, unspecified: Secondary | ICD-10-CM | POA: Diagnosis not present

## 2018-05-12 DIAGNOSIS — R0609 Other forms of dyspnea: Secondary | ICD-10-CM | POA: Diagnosis not present

## 2018-05-12 DIAGNOSIS — I208 Other forms of angina pectoris: Secondary | ICD-10-CM | POA: Diagnosis not present

## 2018-05-12 DIAGNOSIS — Z79899 Other long term (current) drug therapy: Secondary | ICD-10-CM | POA: Diagnosis not present

## 2018-05-12 DIAGNOSIS — Z1239 Encounter for other screening for malignant neoplasm of breast: Secondary | ICD-10-CM | POA: Diagnosis not present

## 2018-05-13 DIAGNOSIS — I208 Other forms of angina pectoris: Secondary | ICD-10-CM | POA: Diagnosis not present

## 2018-05-18 DIAGNOSIS — E7800 Pure hypercholesterolemia, unspecified: Secondary | ICD-10-CM | POA: Diagnosis not present

## 2018-05-18 DIAGNOSIS — D649 Anemia, unspecified: Secondary | ICD-10-CM | POA: Diagnosis not present

## 2018-05-18 DIAGNOSIS — R0602 Shortness of breath: Secondary | ICD-10-CM | POA: Diagnosis not present

## 2018-05-18 DIAGNOSIS — G4733 Obstructive sleep apnea (adult) (pediatric): Secondary | ICD-10-CM | POA: Diagnosis not present

## 2018-05-20 ENCOUNTER — Other Ambulatory Visit: Payer: Self-pay | Admitting: Internal Medicine

## 2018-05-20 DIAGNOSIS — Z1231 Encounter for screening mammogram for malignant neoplasm of breast: Secondary | ICD-10-CM

## 2018-06-02 ENCOUNTER — Ambulatory Visit
Admission: RE | Admit: 2018-06-02 | Discharge: 2018-06-02 | Disposition: A | Discharge status: Home / Self Care | Payer: PPO | Source: Ambulatory Visit | Attending: Internal Medicine | Admitting: Internal Medicine

## 2018-06-02 DIAGNOSIS — Z1231 Encounter for screening mammogram for malignant neoplasm of breast: Secondary | ICD-10-CM | POA: Diagnosis not present

## 2018-07-13 ENCOUNTER — Ambulatory Visit: Payer: PPO | Admitting: Urology

## 2018-08-03 DIAGNOSIS — R739 Hyperglycemia, unspecified: Secondary | ICD-10-CM | POA: Diagnosis not present

## 2018-08-03 DIAGNOSIS — Z79899 Other long term (current) drug therapy: Secondary | ICD-10-CM | POA: Diagnosis not present

## 2018-08-03 DIAGNOSIS — R8290 Unspecified abnormal findings in urine: Secondary | ICD-10-CM | POA: Diagnosis not present

## 2018-08-10 DIAGNOSIS — E7800 Pure hypercholesterolemia, unspecified: Secondary | ICD-10-CM | POA: Diagnosis not present

## 2018-08-10 DIAGNOSIS — R739 Hyperglycemia, unspecified: Secondary | ICD-10-CM | POA: Diagnosis not present

## 2018-08-24 ENCOUNTER — Ambulatory Visit: Payer: PPO | Admitting: Urology

## 2018-08-24 ENCOUNTER — Encounter: Payer: Self-pay | Admitting: Urology

## 2018-08-24 ENCOUNTER — Other Ambulatory Visit: Payer: Self-pay

## 2018-08-24 VITALS — BP 142/77 | HR 73 | Ht 61.0 in | Wt 158.0 lb

## 2018-08-24 DIAGNOSIS — N3946 Mixed incontinence: Secondary | ICD-10-CM

## 2018-08-24 MED ORDER — TOLTERODINE TARTRATE ER 4 MG PO CP24: 4.0000 mg | ORAL_CAPSULE | Freq: Every day | ORAL | 11 refills | Status: DC

## 2018-08-24 NOTE — Progress Notes (Signed)
08/24/2018 11:07 AM   Stacey Warner February 23, 1933 [id number]  Referring provider: Marguarite Arbour, MD [address] [facility] [place], [place] [address]  Chief Complaint  Patient presents with  . Urinary Incontinence    HPI: I was consulted to assess the patient's worsening urinary incontinence over the last 12 months. She is a partial responder to Myrbetriq 25 mg. She describes another pill years ago that caused dry mouth.  She does not leak with coughing sneezing but has urge incontinence. She wears 5 or 6 pads a day moderately wet. She gets up sometimes once at night. She voids every 1-2 hours during the day. She does not have bedwetting  XB:JYNWG 2 hypermobility the bladder neck and no stress incontinence or prolapse Normal cystoscopy  The patient has urge incontinence and mild frequency and nocturia.  Incontinence moderately improved on the higher dosage of Myrbetriq  She stable. 50% better.Patient would like to try Detrol LA 4 mg in combination of the Myrbetriq and reassess in 6 weeks. I can always stop the Myrbetriq. She is willing to set her goals high and try to get drier. Percutaneous tibial nerve stimulation a good option moving forward    incontinence and frequency much improved on combination therapy.  Reassess on double therapy in about 3 months.  Patient will stop Myrbetriq 1 to 2 weeks prior to see if it is benefiting her.    Today Frequency is stable Patient actually stopped the Myrbetriq due to dry mouth which would be less common.  She states she is happy just on Detrol.  It helps moderately so.  Clinically not infected.  Nonspecific abdominal discomfort that comes and goes   PMH: No past medical history on file.  Surgical History: Past Surgical History:  Procedure Laterality Date  . BREAST BIOPSY Right    CORE W/CLIP X 2 - NEG    Home Medications:  Allergies as of 08/24/2018      Reactions   Amoxicillin-pot  Clavulanate Rash   Ciprofloxacin    Other reaction(s): Unknown Pt doesn't remember   Clindamycin    Other reaction(s): Unknown   Minocycline    Other reaction(s): Unknown Pt states made her feel "funny"   Sucralfate    Other reaction(s): Other (See Comments) "can't take"   Sulfa Antibiotics Rash   Had a small area of rash about 30 years ago when she took sulfa, has not taken since.   Tape Rash      Medication List       Accurate as of August 24, 2018 11:07 AM. If you have any questions, ask your nurse or doctor.        aspirin EC 81 MG tablet Take 81 mg by mouth daily.   atorvastatin 10 MG tablet Commonly known as:  LIPITOR Take 10 mg by mouth daily.   azelastine 0.1 % nasal spray Commonly known as:  ASTELIN   CALCIUM + D PO Take by mouth.   cyclobenzaprine 5 MG tablet Commonly known as:  FLEXERIL TAKE 1 TABLET BY MOUTH EVERY DAY AT NIGHT   donepezil 5 MG tablet Commonly known as:  ARICEPT TAKE 1 TABLET BY MOUTH EVERY DAY AT NIGHT   dorzolamide-timolol 22.3-6.8 MG/ML ophthalmic solution Commonly known as:  COSOPT   famotidine 20 MG tablet Commonly known as:  PEPCID Take by mouth.   fluticasone 50 MCG/ACT nasal spray Commonly known as:  FLONASE   latanoprost 0.005 % ophthalmic solution Commonly known as:  Harrel Lemon  mirabegron ER 50 MG Tb24 tablet Commonly known as:  MYRBETRIQ Take 1 tablet (50 mg total) by mouth daily.   MIRALAX PO Take by mouth.   montelukast 10 MG tablet Commonly known as:  SINGULAIR TAKE ONE TABLET DAILY EACH EVENING PRIOR TO BEDTIME   pantoprazole 40 MG tablet Commonly known as:  PROTONIX TAKE 1 TABLET BY MOUTH TWICE A DAY   ranitidine 300 MG capsule Commonly known as:  ZANTAC Take by mouth.   tolterodine 4 MG 24 hr capsule Commonly known as:  Detrol LA Take 1 capsule (4 mg total) by mouth daily.       Allergies:  Allergies  Allergen Reactions  . Amoxicillin-Pot Clavulanate Rash  . Ciprofloxacin     Other  reaction(s): Unknown Pt doesn't remember  . Clindamycin     Other reaction(s): Unknown  . Minocycline     Other reaction(s): Unknown Pt states made her feel "funny"  . Sucralfate     Other reaction(s): Other (See Comments) "can't take"  . Sulfa Antibiotics Rash    Had a small area of rash about 30 years ago when she took sulfa, has not taken since.  . Tape Rash    Family History: Family History  Problem Relation Age of Onset  . Breast cancer Neg Hx     Social History:  reports that she has never smoked. She has never used smokeless tobacco. She reports that she does not drink alcohol or use drugs.  ROS: UROLOGY Frequent Urination?: Yes Hard to postpone urination?: Yes Burning/pain with urination?: No Get up at night to urinate?: No Leakage of urine?: Yes Urine stream starts and stops?: Yes Trouble starting stream?: No Do you have to strain to urinate?: No Blood in urine?: No Urinary tract infection?: No Sexually transmitted disease?: No Injury to kidneys or bladder?: No Painful intercourse?: No Weak stream?: No Currently pregnant?: No Vaginal bleeding?: No Last menstrual period?: No  Gastrointestinal Nausea?: No Vomiting?: No Indigestion/heartburn?: No Diarrhea?: No Constipation?: No  Constitutional Fever: No Night sweats?: No Weight loss?: No Fatigue?: No  Skin Skin rash/lesions?: No Itching?: No  Eyes Blurred vision?: No Double vision?: No  Ears/Nose/Throat Sore throat?: No Sinus problems?: No  Hematologic/Lymphatic Swollen glands?: No Easy bruising?: No  Cardiovascular Leg swelling?: No Chest pain?: No  Respiratory Cough?: No Shortness of breath?: No  Endocrine Excessive thirst?: No  Musculoskeletal Back pain?: No Joint pain?: No  Neurological Headaches?: No Dizziness?: No  Psychologic Depression?: No Anxiety?: No  Physical Exam: BP (!) 142/77   Pulse 73   Ht 5\' 1"  (1.549 m)   Wt 158 lb (71.7 kg)   BMI 29.85 kg/m    Constitutional:  Alert and oriented, No acute distress. HEENT: [place] AT, moist mucus membranes.  Trachea midline, no masses.  Laboratory Data: No results found for: WBC, HGB, HCT, MCV, PLT  No results found for: CREATININE  No results found for: PSA  No results found for: TESTOSTERONE  No results found for: HGBA1C  Urinalysis    Component Value Date/Time   APPEARANCEUR Cloudy (A) 10/13/2017 0947   GLUCOSEU Negative 10/13/2017 0947   BILIRUBINUR Negative 10/13/2017 0947   PROTEINUR Negative 10/13/2017 0947   NITRITE Negative 10/13/2017 0947   LEUKOCYTESUR Trace (A) 10/13/2017 0947    Pertinent Imaging:   Assessment & Plan: 90 tablets and 3 refills of Detrol sent.  I will see her in 1 year no work-up for vague abdominal complaints  There are no diagnoses linked to this  encounter.  Return in about 1 year (around 08/24/2019) for MD follow up.  Martina SinnerScott A Verdelle Valtierra, MD  [facility] [address], [address] [place], [place] [id number] [phone]

## 2018-09-15 DIAGNOSIS — G4733 Obstructive sleep apnea (adult) (pediatric): Secondary | ICD-10-CM | POA: Diagnosis not present

## 2018-09-28 DIAGNOSIS — H401132 Primary open-angle glaucoma, bilateral, moderate stage: Secondary | ICD-10-CM | POA: Diagnosis not present

## 2018-10-06 DIAGNOSIS — Z961 Presence of intraocular lens: Secondary | ICD-10-CM | POA: Diagnosis not present

## 2018-10-20 DIAGNOSIS — K219 Gastro-esophageal reflux disease without esophagitis: Secondary | ICD-10-CM | POA: Diagnosis not present

## 2018-10-20 DIAGNOSIS — K5909 Other constipation: Secondary | ICD-10-CM | POA: Diagnosis not present

## 2018-11-09 DIAGNOSIS — R739 Hyperglycemia, unspecified: Secondary | ICD-10-CM | POA: Diagnosis not present

## 2018-11-09 DIAGNOSIS — Z79899 Other long term (current) drug therapy: Secondary | ICD-10-CM | POA: Diagnosis not present

## 2018-11-09 DIAGNOSIS — G4733 Obstructive sleep apnea (adult) (pediatric): Secondary | ICD-10-CM | POA: Diagnosis not present

## 2018-11-09 DIAGNOSIS — E7800 Pure hypercholesterolemia, unspecified: Secondary | ICD-10-CM | POA: Diagnosis not present

## 2018-11-09 DIAGNOSIS — I208 Other forms of angina pectoris: Secondary | ICD-10-CM | POA: Diagnosis not present

## 2018-11-09 DIAGNOSIS — R0602 Shortness of breath: Secondary | ICD-10-CM | POA: Diagnosis not present

## 2018-11-09 DIAGNOSIS — R270 Ataxia, unspecified: Secondary | ICD-10-CM | POA: Diagnosis not present

## 2018-11-11 DIAGNOSIS — R739 Hyperglycemia, unspecified: Secondary | ICD-10-CM | POA: Diagnosis not present

## 2018-11-11 DIAGNOSIS — Z79899 Other long term (current) drug therapy: Secondary | ICD-10-CM | POA: Diagnosis not present

## 2018-11-11 DIAGNOSIS — E7800 Pure hypercholesterolemia, unspecified: Secondary | ICD-10-CM | POA: Diagnosis not present

## 2018-11-11 DIAGNOSIS — R270 Ataxia, unspecified: Secondary | ICD-10-CM | POA: Diagnosis not present

## 2018-12-11 DIAGNOSIS — R4189 Other symptoms and signs involving cognitive functions and awareness: Secondary | ICD-10-CM | POA: Diagnosis not present

## 2018-12-11 DIAGNOSIS — Z23 Encounter for immunization: Secondary | ICD-10-CM | POA: Diagnosis not present

## 2018-12-11 DIAGNOSIS — G4733 Obstructive sleep apnea (adult) (pediatric): Secondary | ICD-10-CM | POA: Diagnosis not present

## 2018-12-11 DIAGNOSIS — R55 Syncope and collapse: Secondary | ICD-10-CM | POA: Diagnosis not present

## 2018-12-11 DIAGNOSIS — R2689 Other abnormalities of gait and mobility: Secondary | ICD-10-CM | POA: Diagnosis not present

## 2018-12-17 DIAGNOSIS — Z8701 Personal history of pneumonia (recurrent): Secondary | ICD-10-CM | POA: Diagnosis not present

## 2018-12-17 DIAGNOSIS — N3281 Overactive bladder: Secondary | ICD-10-CM | POA: Diagnosis not present

## 2018-12-17 DIAGNOSIS — G4739 Other sleep apnea: Secondary | ICD-10-CM | POA: Diagnosis not present

## 2018-12-17 DIAGNOSIS — Z9181 History of falling: Secondary | ICD-10-CM | POA: Diagnosis not present

## 2018-12-17 DIAGNOSIS — H409 Unspecified glaucoma: Secondary | ICD-10-CM | POA: Diagnosis not present

## 2018-12-17 DIAGNOSIS — R41841 Cognitive communication deficit: Secondary | ICD-10-CM | POA: Diagnosis not present

## 2018-12-17 DIAGNOSIS — I6529 Occlusion and stenosis of unspecified carotid artery: Secondary | ICD-10-CM | POA: Diagnosis not present

## 2018-12-17 DIAGNOSIS — K219 Gastro-esophageal reflux disease without esophagitis: Secondary | ICD-10-CM | POA: Diagnosis not present

## 2018-12-17 DIAGNOSIS — Z87440 Personal history of urinary (tract) infections: Secondary | ICD-10-CM | POA: Diagnosis not present

## 2018-12-17 DIAGNOSIS — Z9049 Acquired absence of other specified parts of digestive tract: Secondary | ICD-10-CM | POA: Diagnosis not present

## 2018-12-17 DIAGNOSIS — I019 Acute rheumatic heart disease, unspecified: Secondary | ICD-10-CM | POA: Diagnosis not present

## 2018-12-17 DIAGNOSIS — K5790 Diverticulosis of intestine, part unspecified, without perforation or abscess without bleeding: Secondary | ICD-10-CM | POA: Diagnosis not present

## 2018-12-17 DIAGNOSIS — H9190 Unspecified hearing loss, unspecified ear: Secondary | ICD-10-CM | POA: Diagnosis not present

## 2018-12-17 DIAGNOSIS — E785 Hyperlipidemia, unspecified: Secondary | ICD-10-CM | POA: Diagnosis not present

## 2019-01-21 DIAGNOSIS — H26491 Other secondary cataract, right eye: Secondary | ICD-10-CM | POA: Diagnosis not present

## 2019-01-25 IMAGING — MG MM DIGITAL SCREENING BILAT W/ TOMO W/ CAD
9 of 14 series · 9 of 30 positions shown · non-contrast
Comparison: Previous exam(s).

CLINICAL DATA: Screening.

EXAM:
2D DIGITAL SCREENING BILATERAL MAMMOGRAM WITH CAD AND ADJUNCT TOMO

[R MLO (1 of 2)]
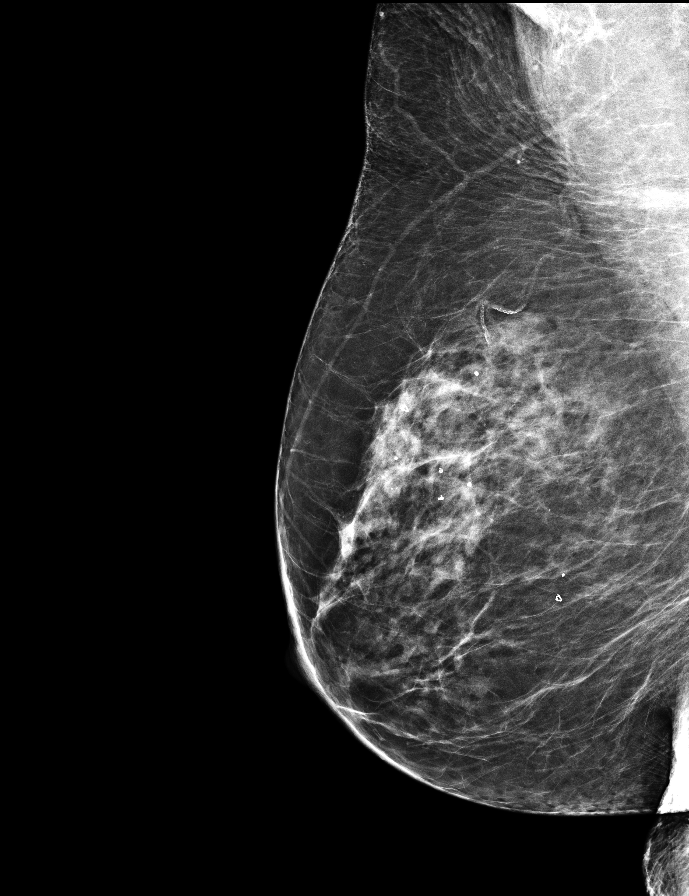

[L MLO]
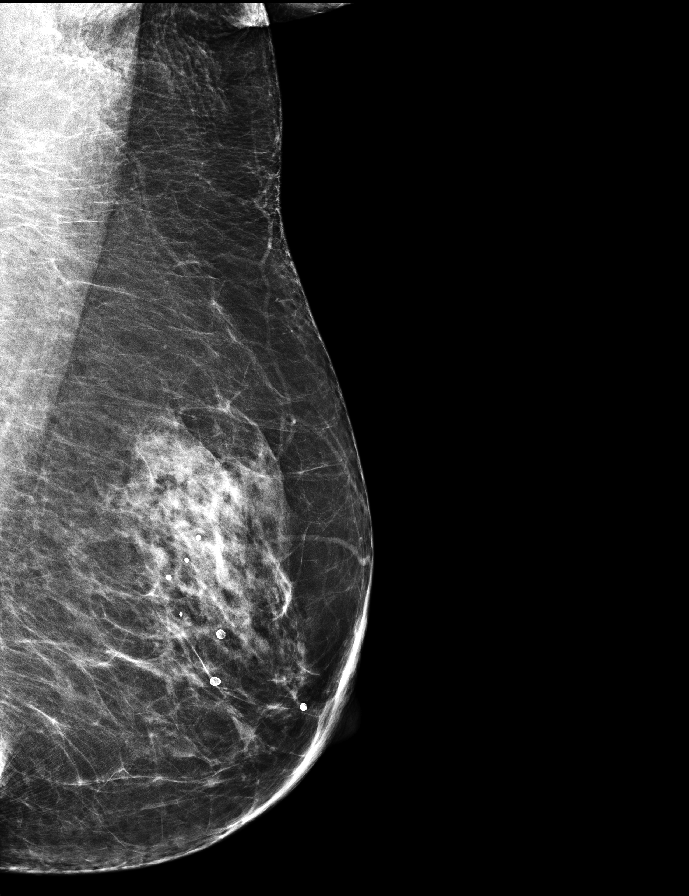

[L CC synth-2D]
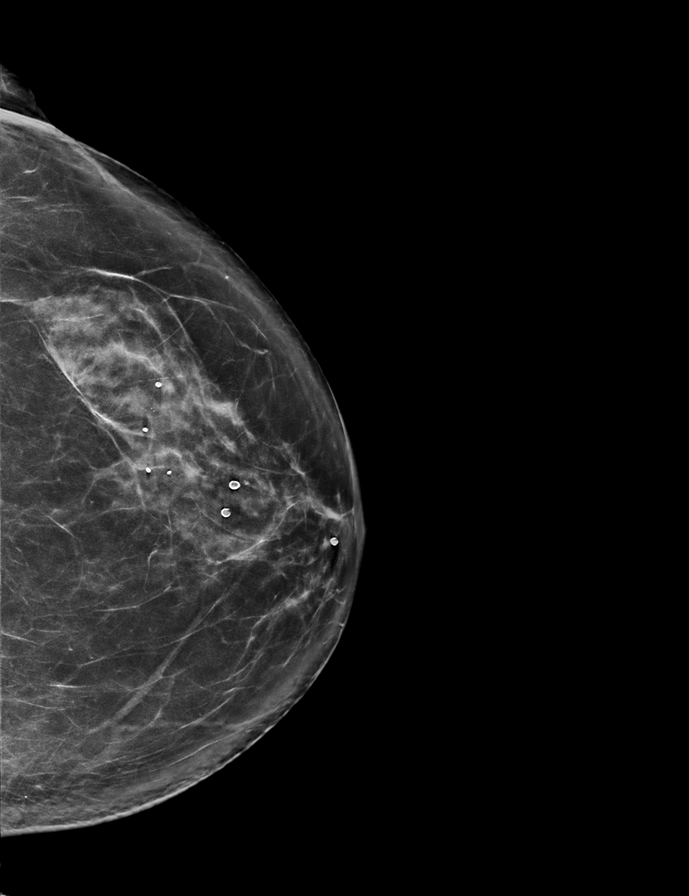

[R CC synth-2D]
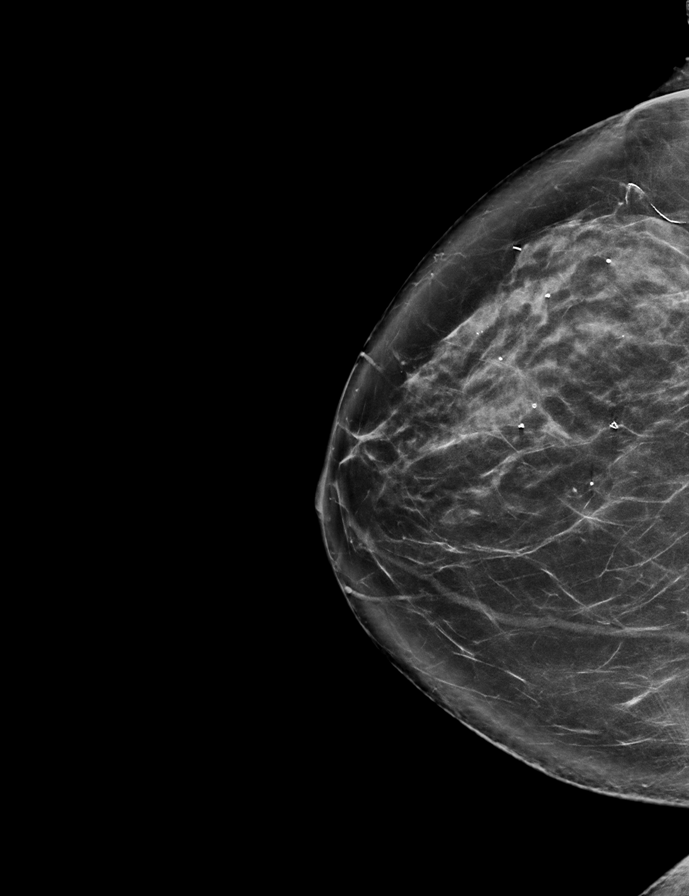

[R MLO (2 of 2)]
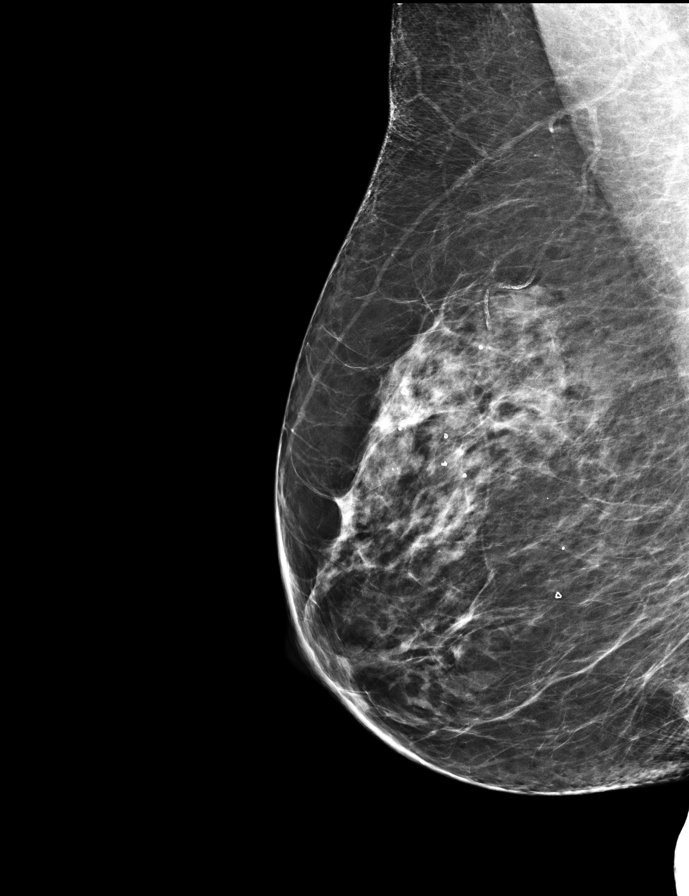

[L MLO synth-2D]
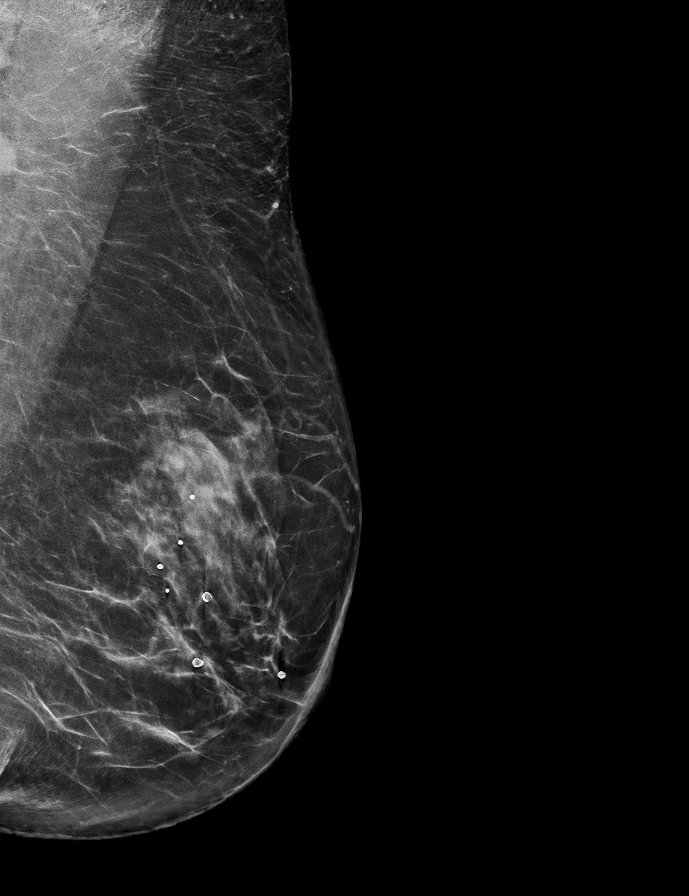

[R MLO synth-2D]
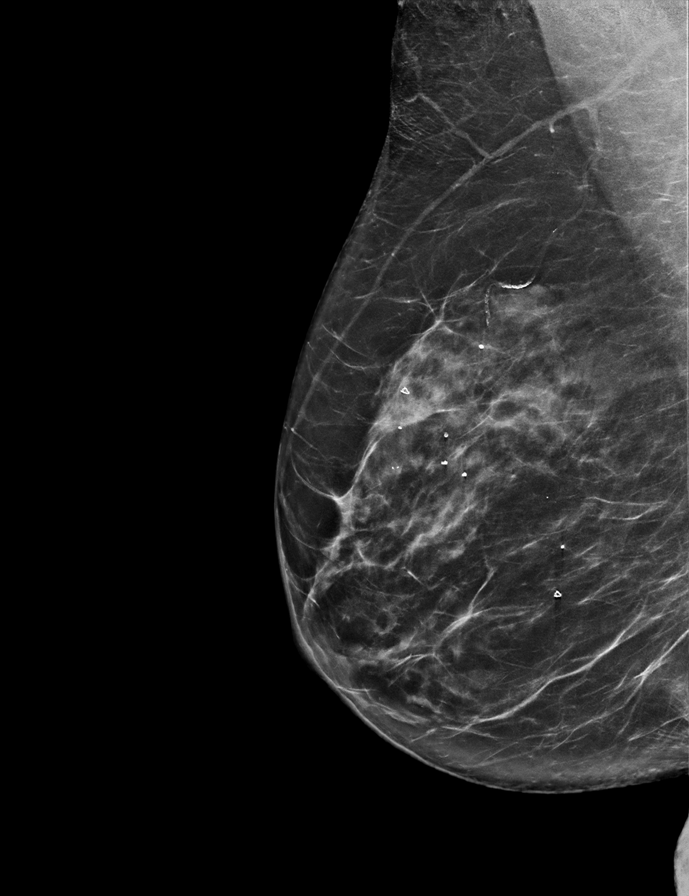

[L CC]
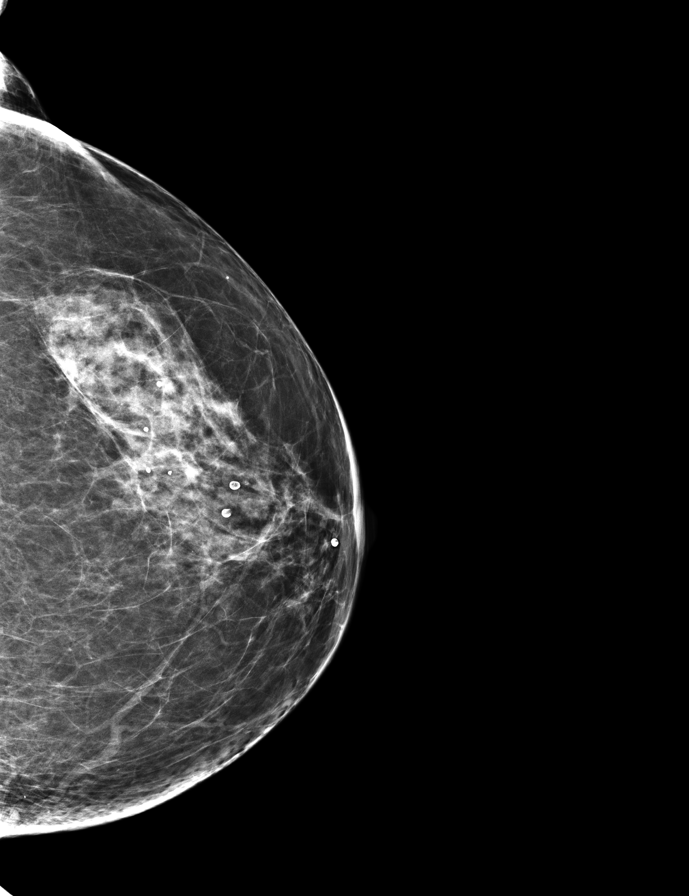

[R CC]
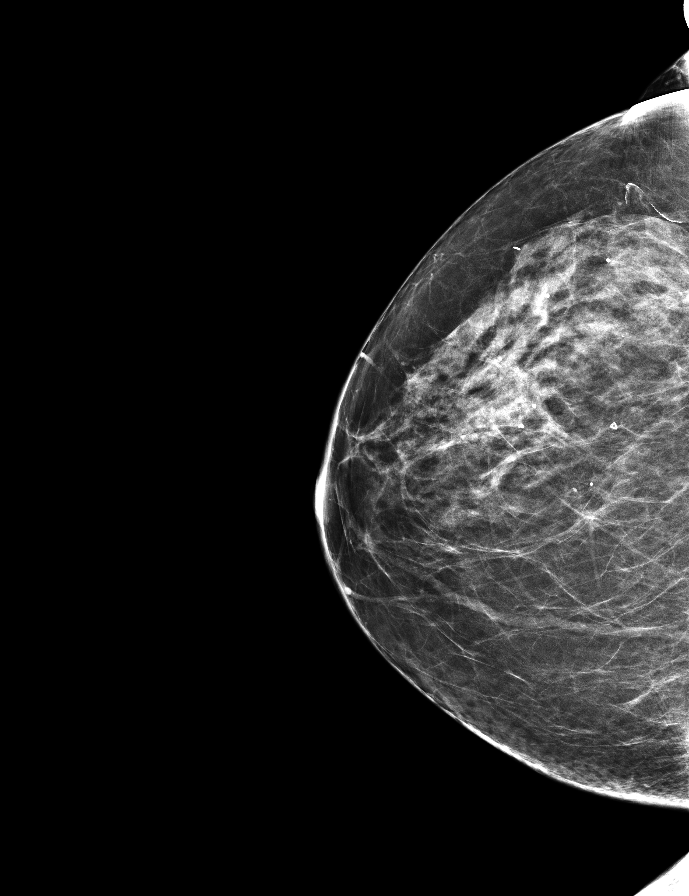

[9 of 30 positions shown; findings below may reference images not displayed]

ACR Breast Density Category b: There are scattered areas of
fibroglandular density.
FINDINGS: There are no findings suspicious for malignancy. Images were
processed with CAD.
IMPRESSION: No mammographic evidence of malignancy. A result letter of this
screening mammogram will be mailed directly to the patient.

RECOMMENDATION:
Screening mammogram in one year. (Code:97-6-RS4)

BI-RADS CATEGORY  1: Negative.

## 2019-02-09 DIAGNOSIS — M5489 Other dorsalgia: Secondary | ICD-10-CM | POA: Diagnosis not present

## 2019-02-09 DIAGNOSIS — R739 Hyperglycemia, unspecified: Secondary | ICD-10-CM | POA: Diagnosis not present

## 2019-02-09 DIAGNOSIS — M5136 Other intervertebral disc degeneration, lumbar region: Secondary | ICD-10-CM | POA: Diagnosis not present

## 2019-02-09 DIAGNOSIS — G4733 Obstructive sleep apnea (adult) (pediatric): Secondary | ICD-10-CM | POA: Diagnosis not present

## 2019-02-09 DIAGNOSIS — R413 Other amnesia: Secondary | ICD-10-CM | POA: Diagnosis not present

## 2019-02-09 DIAGNOSIS — D649 Anemia, unspecified: Secondary | ICD-10-CM | POA: Diagnosis not present

## 2019-02-09 DIAGNOSIS — M4186 Other forms of scoliosis, lumbar region: Secondary | ICD-10-CM | POA: Diagnosis not present

## 2019-02-09 DIAGNOSIS — E7800 Pure hypercholesterolemia, unspecified: Secondary | ICD-10-CM | POA: Diagnosis not present

## 2019-02-11 DIAGNOSIS — H26492 Other secondary cataract, left eye: Secondary | ICD-10-CM | POA: Diagnosis not present

## 2019-03-05 DIAGNOSIS — M542 Cervicalgia: Secondary | ICD-10-CM | POA: Diagnosis not present

## 2019-03-05 DIAGNOSIS — B029 Zoster without complications: Secondary | ICD-10-CM | POA: Diagnosis not present

## 2019-03-05 DIAGNOSIS — H9202 Otalgia, left ear: Secondary | ICD-10-CM | POA: Diagnosis not present

## 2019-03-16 DIAGNOSIS — B0223 Postherpetic polyneuropathy: Secondary | ICD-10-CM | POA: Diagnosis not present

## 2019-03-30 DIAGNOSIS — B0223 Postherpetic polyneuropathy: Secondary | ICD-10-CM | POA: Diagnosis not present

## 2019-05-03 ENCOUNTER — Other Ambulatory Visit: Payer: Self-pay | Admitting: Internal Medicine

## 2019-05-03 DIAGNOSIS — Z1231 Encounter for screening mammogram for malignant neoplasm of breast: Secondary | ICD-10-CM

## 2019-05-17 DIAGNOSIS — G4733 Obstructive sleep apnea (adult) (pediatric): Secondary | ICD-10-CM | POA: Diagnosis not present

## 2019-06-27 DIAGNOSIS — G4733 Obstructive sleep apnea (adult) (pediatric): Secondary | ICD-10-CM | POA: Diagnosis not present

## 2019-07-28 DIAGNOSIS — J301 Allergic rhinitis due to pollen: Secondary | ICD-10-CM | POA: Diagnosis not present

## 2019-07-28 DIAGNOSIS — H6121 Impacted cerumen, right ear: Secondary | ICD-10-CM | POA: Diagnosis not present

## 2019-07-28 DIAGNOSIS — H903 Sensorineural hearing loss, bilateral: Secondary | ICD-10-CM | POA: Diagnosis not present

## 2019-07-29 DIAGNOSIS — L304 Erythema intertrigo: Secondary | ICD-10-CM | POA: Diagnosis not present

## 2019-07-29 DIAGNOSIS — L820 Inflamed seborrheic keratosis: Secondary | ICD-10-CM | POA: Diagnosis not present

## 2019-07-29 DIAGNOSIS — L538 Other specified erythematous conditions: Secondary | ICD-10-CM | POA: Diagnosis not present

## 2019-08-02 DIAGNOSIS — R739 Hyperglycemia, unspecified: Secondary | ICD-10-CM | POA: Diagnosis not present

## 2019-08-02 DIAGNOSIS — E7800 Pure hypercholesterolemia, unspecified: Secondary | ICD-10-CM | POA: Diagnosis not present

## 2019-08-02 DIAGNOSIS — Z79899 Other long term (current) drug therapy: Secondary | ICD-10-CM | POA: Diagnosis not present

## 2019-08-12 DIAGNOSIS — R4189 Other symptoms and signs involving cognitive functions and awareness: Secondary | ICD-10-CM | POA: Diagnosis not present

## 2019-08-12 DIAGNOSIS — R55 Syncope and collapse: Secondary | ICD-10-CM | POA: Diagnosis not present

## 2019-08-12 DIAGNOSIS — G4733 Obstructive sleep apnea (adult) (pediatric): Secondary | ICD-10-CM | POA: Diagnosis not present

## 2019-08-19 DIAGNOSIS — Z1231 Encounter for screening mammogram for malignant neoplasm of breast: Secondary | ICD-10-CM | POA: Diagnosis not present

## 2019-08-19 DIAGNOSIS — R739 Hyperglycemia, unspecified: Secondary | ICD-10-CM | POA: Diagnosis not present

## 2019-08-19 DIAGNOSIS — Z0000 Encounter for general adult medical examination without abnormal findings: Secondary | ICD-10-CM | POA: Diagnosis not present

## 2019-08-19 DIAGNOSIS — G2401 Drug induced subacute dyskinesia: Secondary | ICD-10-CM | POA: Diagnosis not present

## 2019-08-19 DIAGNOSIS — R413 Other amnesia: Secondary | ICD-10-CM | POA: Diagnosis not present

## 2019-08-19 DIAGNOSIS — E7800 Pure hypercholesterolemia, unspecified: Secondary | ICD-10-CM | POA: Diagnosis not present

## 2019-08-19 DIAGNOSIS — Z1211 Encounter for screening for malignant neoplasm of colon: Secondary | ICD-10-CM | POA: Diagnosis not present

## 2019-08-19 DIAGNOSIS — Z79899 Other long term (current) drug therapy: Secondary | ICD-10-CM | POA: Diagnosis not present

## 2019-08-26 DIAGNOSIS — Z79899 Other long term (current) drug therapy: Secondary | ICD-10-CM | POA: Diagnosis not present

## 2019-08-26 DIAGNOSIS — R739 Hyperglycemia, unspecified: Secondary | ICD-10-CM | POA: Diagnosis not present

## 2019-08-26 DIAGNOSIS — Z1231 Encounter for screening mammogram for malignant neoplasm of breast: Secondary | ICD-10-CM | POA: Diagnosis not present

## 2019-08-26 DIAGNOSIS — E7800 Pure hypercholesterolemia, unspecified: Secondary | ICD-10-CM | POA: Diagnosis not present

## 2019-08-26 DIAGNOSIS — Z0000 Encounter for general adult medical examination without abnormal findings: Secondary | ICD-10-CM | POA: Diagnosis not present

## 2019-08-26 DIAGNOSIS — Z1211 Encounter for screening for malignant neoplasm of colon: Secondary | ICD-10-CM | POA: Diagnosis not present

## 2019-08-26 DIAGNOSIS — R413 Other amnesia: Secondary | ICD-10-CM | POA: Diagnosis not present

## 2019-08-26 DIAGNOSIS — G2401 Drug induced subacute dyskinesia: Secondary | ICD-10-CM | POA: Diagnosis not present

## 2019-08-27 ENCOUNTER — Ambulatory Visit
Admission: RE | Admit: 2019-08-27 | Discharge: 2019-08-27 | Disposition: A | Discharge status: Home / Self Care | Payer: PPO | Source: Ambulatory Visit | Attending: Internal Medicine | Admitting: Internal Medicine

## 2019-08-27 DIAGNOSIS — Z1231 Encounter for screening mammogram for malignant neoplasm of breast: Secondary | ICD-10-CM | POA: Insufficient documentation

## 2019-08-30 ENCOUNTER — Ambulatory Visit: Payer: PPO | Admitting: Urology

## 2019-09-06 ENCOUNTER — Other Ambulatory Visit: Payer: Self-pay

## 2019-09-06 ENCOUNTER — Encounter: Payer: Self-pay | Admitting: Urology

## 2019-09-06 ENCOUNTER — Ambulatory Visit: Payer: PPO | Admitting: Urology

## 2019-09-06 VITALS — BP 135/74 | HR 69

## 2019-09-06 DIAGNOSIS — N3946 Mixed incontinence: Secondary | ICD-10-CM | POA: Diagnosis not present

## 2019-09-06 LAB — MICROSCOPIC EXAMINATION: Epithelial Cells (non renal): >10 /hpf — ABNORMAL HIGH (ref 0–10)

## 2019-09-06 LAB — URINALYSIS, COMPLETE
Bilirubin, UA: NEGATIVE
Glucose, UA: NEGATIVE
Nitrite, UA: NEGATIVE
Protein,UA: NEGATIVE
RBC, UA: NEGATIVE
Specific Gravity, UA: 1.025 (ref 1.005–1.030)
Urobilinogen, Ur: 0.2 mg/dL (ref 0.2–1.0)
pH, UA: 5 (ref 5.0–7.5)

## 2019-09-06 MED ORDER — TOLTERODINE TARTRATE ER 4 MG PO CP24: 4.0000 mg | ORAL_CAPSULE | Freq: Every day | ORAL | 3 refills | Status: DC

## 2019-09-06 NOTE — Patient Instructions (Signed)

## 2019-09-06 NOTE — Progress Notes (Signed)
09/06/2019 9:35 AM   Gigi Gin Fuller Song 1932/09/11 [id number]  Referring provider: Marguarite Arbour, MD [address] [facility] [place],  [place] [address]  No chief complaint on file.   HPI: I was consulted to assess the patient's worsening urinary incontinence over the last 12 months. She is a partial responder to Myrbetriq 25 mg. She describes another pill years ago that caused dry mouth.  She does not leak with coughing sneezing but has urge incontinence. She wears 5 or 6 pads a day moderately wet. She gets up sometimes once at night. She voids every 1-2 hours during the day. She does not have bedwetting  TF:TDDUK 2 hypermobility the bladder neck and no stress incontinence or prolapse Normal cystoscopy  The patient has urge incontinence and mild frequency and nocturia.  Incontinence moderately improved on the higher dosage of Myrbetriq  She stable. 50% better.Patient would like to try Detrol LA 4 mg in combination of the Myrbetriq and reassess in 6 weeks. I can always stop the Myrbetriq. She is willing to set her goals high and try to get drier. Percutaneous tibial nerve stimulation a good option moving forward   Patient actually stopped the Myrbetriq due to dry mouth which would be less common.  She states she is happy just on Detrol.  It helps moderately so.   I will see in 1 year on Detrol 90x3    Today Patient has ongoing urge incontinence perhaps little bit worse or more frequent.  Still on Detrol.  Clinically not infected.  Frequency again is worse.   PMH: No past medical history on file.  Surgical History: Past Surgical History:  Procedure Laterality Date  . BREAST BIOPSY Right    CORE W/CLIP X 2 - NEG    Home Medications:  Allergies as of 09/06/2019      Reactions   Amoxicillin-pot Clavulanate Rash   Ciprofloxacin    Other reaction(s): Unknown Pt doesn't remember   Clindamycin    Other reaction(s): Unknown   Minocycline     Other reaction(s): Unknown Pt states made her feel "funny"   Sucralfate    Other reaction(s): Other (See Comments) "can't take"   Sulfa Antibiotics Rash   Had a small area of rash about 30 years ago when she took sulfa, has not taken since.   Tape Rash      Medication List       Accurate as of September 06, 2019  9:35 AM. If you have any questions, ask your nurse or doctor.        aspirin EC 81 MG tablet Take 81 mg by mouth daily.   atorvastatin 10 MG tablet Commonly known as: LIPITOR Take 10 mg by mouth daily.   azelastine 0.1 % nasal spray Commonly known as: ASTELIN   CALCIUM + D PO Take by mouth.   cyclobenzaprine 5 MG tablet Commonly known as: FLEXERIL TAKE 1 TABLET BY MOUTH EVERY DAY AT NIGHT   donepezil 5 MG tablet Commonly known as: ARICEPT TAKE 1 TABLET BY MOUTH EVERY DAY AT NIGHT   dorzolamide-timolol 22.3-6.8 MG/ML ophthalmic solution Commonly known as: COSOPT   famotidine 20 MG tablet Commonly known as: PEPCID Take by mouth.   fluticasone 50 MCG/ACT nasal spray Commonly known as: FLONASE   latanoprost 0.005 % ophthalmic solution Commonly known as: XALATAN   mirabegron ER 50 MG Tb24 tablet Commonly known as: MYRBETRIQ Take 1 tablet (50 mg total) by mouth daily.   MIRALAX PO Take  by mouth.   montelukast 10 MG tablet Commonly known as: SINGULAIR TAKE ONE TABLET DAILY EACH EVENING PRIOR TO BEDTIME   pantoprazole 40 MG tablet Commonly known as: PROTONIX TAKE 1 TABLET BY MOUTH TWICE A DAY   ranitidine 300 MG capsule Commonly known as: ZANTAC Take by mouth.   tolterodine 4 MG 24 hr capsule Commonly known as: Detrol LA Take 1 capsule (4 mg total) by mouth daily.       Allergies:  Allergies  Allergen Reactions  . Amoxicillin-Pot Clavulanate Rash  . Ciprofloxacin     Other reaction(s): Unknown Pt doesn't remember  . Clindamycin     Other reaction(s): Unknown  . Minocycline     Other reaction(s): Unknown Pt states made her feel  "funny"  . Sucralfate     Other reaction(s): Other (See Comments) "can't take"  . Sulfa Antibiotics Rash    Had a small area of rash about 30 years ago when she took sulfa, has not taken since.  . Tape Rash    Family History: Family History  Problem Relation Age of Onset  . Breast cancer Neg Hx     Social History:  reports that she has never smoked. She has never used smokeless tobacco. She reports that she does not drink alcohol and does not use drugs.  ROS:                                        Physical Exam: There were no vitals taken for this visit.  Constitutional:  Alert and oriented, No acute distress.   Laboratory Data: No results found for: WBC, HGB, HCT, MCV, PLT  No results found for: CREATININE  No results found for: PSA  No results found for: TESTOSTERONE  No results found for: HGBA1C  Urinalysis    Component Value Date/Time   APPEARANCEUR Cloudy (A) 10/13/2017 0947   GLUCOSEU Negative 10/13/2017 0947   BILIRUBINUR Negative 10/13/2017 0947   PROTEINUR Negative 10/13/2017 0947   NITRITE Negative 10/13/2017 0947   LEUKOCYTESUR Trace (A) 10/13/2017 0947    Pertinent Imaging:   Assessment & Plan: Patient has worsening frequency and urge incontinence.  Send urine for culture.  Discussed percutaneous tibial nerve stimulation.  Order this treatment.  Handout given.  90x3 Detrol sent to pharmacy and see me in a year  There are no diagnoses linked to this encounter.  No follow-ups on file.  Reece Packer, MD  [facility] [address] [place], [place] [address] [phone]

## 2019-09-06 NOTE — Addendum Note (Signed)
Addended by: Veneta Penton on: 09/06/2019 10:44 AM   Modules accepted: Orders

## 2019-09-09 LAB — CULTURE, URINE COMPREHENSIVE

## 2019-09-13 ENCOUNTER — Telehealth: Payer: Self-pay | Admitting: *Deleted

## 2019-09-13 NOTE — Telephone Encounter (Signed)
Patient may be scheduled for  PTNS. No Prior Auth needed.

## 2019-10-25 ENCOUNTER — Other Ambulatory Visit: Payer: Self-pay

## 2019-10-25 ENCOUNTER — Ambulatory Visit (INDEPENDENT_AMBULATORY_CARE_PROVIDER_SITE_OTHER): Payer: PPO | Admitting: Family Medicine

## 2019-10-25 DIAGNOSIS — N3946 Mixed incontinence: Secondary | ICD-10-CM

## 2019-10-25 NOTE — Progress Notes (Signed)
PTNS  Session # 1  Health & Social Factors: no change Caffeine: 1 Alcohol: 0 Daytime voids #per day: 7-8 Night-time voids #per night: 0 Urgency: severe Incontinence Episodes #per day: 1 Ankle used: right Treatment Setting: 7 Feeling/ Response: sensory Comments: Patient tolerated well  Preformed By: Teressa Lower, CMA  Follow Up: 1 week #2

## 2019-11-01 ENCOUNTER — Ambulatory Visit (INDEPENDENT_AMBULATORY_CARE_PROVIDER_SITE_OTHER): Payer: PPO | Admitting: Physician Assistant

## 2019-11-01 ENCOUNTER — Other Ambulatory Visit: Payer: Self-pay

## 2019-11-01 DIAGNOSIS — N3946 Mixed incontinence: Secondary | ICD-10-CM | POA: Diagnosis not present

## 2019-11-01 NOTE — Progress Notes (Signed)
PTNS  Session # 2  Health & Social Factors: No Change  Caffeine: 1 Alcohol: 0 Daytime voids #per day: 7 Night-time voids #per night: 1 Urgency: Strong Incontinence Episodes #per day: 1 Ankle used: Left Treatment Setting: 6 Feeling/ Response:Sensory & Toe Frlex Comments: N/A  Performed By: Debbe Bales, CMA   Follow Up: RTC as scheduled

## 2019-11-01 NOTE — Patient Instructions (Signed)

## 2019-11-08 ENCOUNTER — Other Ambulatory Visit: Payer: Self-pay

## 2019-11-08 ENCOUNTER — Ambulatory Visit (INDEPENDENT_AMBULATORY_CARE_PROVIDER_SITE_OTHER): Payer: PPO

## 2019-11-08 DIAGNOSIS — N3946 Mixed incontinence: Secondary | ICD-10-CM | POA: Diagnosis not present

## 2019-11-08 NOTE — Progress Notes (Signed)
PTNS  Session # 3  Health & Social Factors: No Change  Caffeine: 2 Alcohol: 0 Daytime voids #per day: 8-9 Night-time voids #per night: 0-1 Urgency: Strong Incontinence Episodes #per day: 0-1 Ankle used: Left Treatment Setting: 3 Feeling/ Response:Sensory & Toe Frlex Comments: N/A  Performed By: Gerarda Gunther, RMA.  Follow Up: RTC as scheduled

## 2019-11-08 NOTE — Patient Instructions (Signed)
Tracking Your Bladder Symptoms    Patient Name:___________________________________________________   Sample: Day   Daytime Voids  Nighttime Voids Urgency for the Day(0-4) Number of Accidents Beverage Comments  Monday IIII II 2 I Water IIII Coffee  I      Week Starting:____________________________________   Day Daytime  Voids Nighttime  Voids Urgency for  The Day(0-4) Number of Accidents Beverages Comments                                                           This week my symptoms were:  O much better  O better O the same O worse   

## 2019-11-15 ENCOUNTER — Other Ambulatory Visit: Payer: Self-pay

## 2019-11-15 ENCOUNTER — Ambulatory Visit (INDEPENDENT_AMBULATORY_CARE_PROVIDER_SITE_OTHER): Payer: PPO

## 2019-11-15 DIAGNOSIS — N3946 Mixed incontinence: Secondary | ICD-10-CM | POA: Diagnosis not present

## 2019-11-15 NOTE — Patient Instructions (Signed)
Tracking Your Bladder Symptoms    Patient Name:___________________________________________________   Sample: Day   Daytime Voids  Nighttime Voids Urgency for the Day(0-4) Number of Accidents Beverage Comments  Monday IIII II 2 I Water IIII Coffee  I      Week Starting:____________________________________   Day Daytime  Voids Nighttime  Voids Urgency for  The Day(0-4) Number of Accidents Beverages Comments                                                           This week my symptoms were:  O much better  O better O the same O worse   

## 2019-11-15 NOTE — Progress Notes (Addendum)
PTNS  Session # 4  Health & Social Factors: No change Caffeine: 1 Alcohol: 0 Daytime voids #per day: 8 Night-time voids #per night: 0-1 Urgency: Strong Incontinence Episodes #per day: 0 Ankle used: Right Treatment Setting: 3 Feeling/ Response: Toe flex & sensory Comments: Patient tolerated well, no complications were noted.   Performed By: Franchot Erichsen, CMA   Follow Up: RTC in 1 week for session #4.

## 2019-11-22 ENCOUNTER — Other Ambulatory Visit: Payer: Self-pay

## 2019-11-22 ENCOUNTER — Ambulatory Visit (INDEPENDENT_AMBULATORY_CARE_PROVIDER_SITE_OTHER): Payer: PPO | Admitting: Family Medicine

## 2019-11-22 DIAGNOSIS — N3946 Mixed incontinence: Secondary | ICD-10-CM | POA: Diagnosis not present

## 2019-11-22 NOTE — Progress Notes (Addendum)
PTNS  Session # 5  Health & Social Factors: no change Caffeine: 1 Alcohol: 0 Daytime voids #per day: 8 Night-time voids #per night: 0 Urgency: mild Incontinence Episodes #per day: 0 Ankle used: right Treatment Setting: 7 Feeling/ Response: both Comments: Patient tolerated well  Performed By: Teressa Lower, CMA  Follow Up: 1 week #5

## 2019-11-30 ENCOUNTER — Other Ambulatory Visit: Payer: Self-pay

## 2019-11-30 ENCOUNTER — Ambulatory Visit (INDEPENDENT_AMBULATORY_CARE_PROVIDER_SITE_OTHER): Payer: PPO | Admitting: Family Medicine

## 2019-11-30 DIAGNOSIS — N3946 Mixed incontinence: Secondary | ICD-10-CM

## 2019-11-30 NOTE — Progress Notes (Addendum)
PTNS  Session # 6  Health & Social Factors: no change Caffeine: 1 Alcohol: 0 Daytime voids #per day: 7 Night-time voids #per night: 0 Urgency: mild Incontinence Episodes #per day: 0-1 Ankle used: right Treatment Setting: 5 Feeling/ Response: sensory Comments: patient tolerated well  Performed By: Teressa Lower, CMA  Follow Up: 1 week #6

## 2019-12-01 DIAGNOSIS — E7800 Pure hypercholesterolemia, unspecified: Secondary | ICD-10-CM | POA: Diagnosis not present

## 2019-12-01 DIAGNOSIS — Z79899 Other long term (current) drug therapy: Secondary | ICD-10-CM | POA: Diagnosis not present

## 2019-12-01 DIAGNOSIS — R739 Hyperglycemia, unspecified: Secondary | ICD-10-CM | POA: Diagnosis not present

## 2019-12-06 ENCOUNTER — Other Ambulatory Visit: Payer: Self-pay

## 2019-12-06 ENCOUNTER — Ambulatory Visit (INDEPENDENT_AMBULATORY_CARE_PROVIDER_SITE_OTHER): Payer: PPO | Admitting: Family Medicine

## 2019-12-06 DIAGNOSIS — M79605 Pain in left leg: Secondary | ICD-10-CM | POA: Diagnosis not present

## 2019-12-06 DIAGNOSIS — R413 Other amnesia: Secondary | ICD-10-CM | POA: Diagnosis not present

## 2019-12-06 DIAGNOSIS — M79604 Pain in right leg: Secondary | ICD-10-CM | POA: Diagnosis not present

## 2019-12-06 DIAGNOSIS — N3946 Mixed incontinence: Secondary | ICD-10-CM | POA: Diagnosis not present

## 2019-12-06 DIAGNOSIS — M5489 Other dorsalgia: Secondary | ICD-10-CM | POA: Diagnosis not present

## 2019-12-06 DIAGNOSIS — R739 Hyperglycemia, unspecified: Secondary | ICD-10-CM | POA: Diagnosis not present

## 2019-12-06 DIAGNOSIS — E7800 Pure hypercholesterolemia, unspecified: Secondary | ICD-10-CM | POA: Diagnosis not present

## 2019-12-06 DIAGNOSIS — Z79899 Other long term (current) drug therapy: Secondary | ICD-10-CM | POA: Diagnosis not present

## 2019-12-06 NOTE — Progress Notes (Addendum)
PTNS  Session # 7  Health & Social Factors: no change Caffeine: 1-2 Alcohol: 0 Daytime voids #per day: 7 Night-time voids #per night: 0 Urgency: mild Incontinence Episodes #per day: 0 Ankle used: right Treatment Setting: 6 Feeling/ Response: sensory Comments: Patient tolerated well  Performed By: Teressa Lower, CMA  Follow Up: 1 week #7

## 2019-12-11 DIAGNOSIS — G4733 Obstructive sleep apnea (adult) (pediatric): Secondary | ICD-10-CM | POA: Diagnosis not present

## 2019-12-13 ENCOUNTER — Other Ambulatory Visit: Payer: Self-pay

## 2019-12-13 ENCOUNTER — Ambulatory Visit (INDEPENDENT_AMBULATORY_CARE_PROVIDER_SITE_OTHER): Payer: PPO

## 2019-12-13 DIAGNOSIS — N3946 Mixed incontinence: Secondary | ICD-10-CM | POA: Diagnosis not present

## 2019-12-13 NOTE — Progress Notes (Signed)
PTNS  Session # 7   Health & Social Factors: No Changed Caffeine: 2 Alcohol: 0 Daytime voids #per day: 7 Night-time voids #per night: 0 Urgency: Mild Incontinence Episodes #per day: 0 Ankle used: Right Treatment Setting: 7 Feeling/ Response: Toe Flex & Sensory  Comments: Pt tolerated well  Performed By: Debbe Bales, CMA  Follow Up: RTC in 1 week as scheduled.

## 2019-12-13 NOTE — Patient Instructions (Signed)
Tracking Your Bladder Symptoms    Patient Name:___________________________________________________   Sample: Day   Daytime Voids  Nighttime Voids Urgency for the Day(0-4) Number of Accidents Beverage Comments  Monday IIII II 2 I Water IIII Coffee  I      Week Starting:____________________________________   Day Daytime  Voids Nighttime  Voids Urgency for  The Day(0-4) Number of Accidents Beverages Comments                                                           This week my symptoms were:  O much better  O better O the same O worse   

## 2019-12-16 DIAGNOSIS — K219 Gastro-esophageal reflux disease without esophagitis: Secondary | ICD-10-CM | POA: Diagnosis not present

## 2019-12-17 DIAGNOSIS — M4726 Other spondylosis with radiculopathy, lumbar region: Secondary | ICD-10-CM | POA: Diagnosis not present

## 2019-12-17 DIAGNOSIS — M5136 Other intervertebral disc degeneration, lumbar region: Secondary | ICD-10-CM | POA: Diagnosis not present

## 2019-12-20 ENCOUNTER — Ambulatory Visit: Payer: Self-pay

## 2019-12-21 ENCOUNTER — Other Ambulatory Visit: Payer: Self-pay

## 2019-12-21 ENCOUNTER — Ambulatory Visit (INDEPENDENT_AMBULATORY_CARE_PROVIDER_SITE_OTHER): Payer: PPO | Admitting: *Deleted

## 2019-12-21 DIAGNOSIS — N3946 Mixed incontinence: Secondary | ICD-10-CM | POA: Diagnosis not present

## 2019-12-21 NOTE — Progress Notes (Addendum)
PTNS  Session # 9  Health & Social Factors: No Changed Caffeine: 2 Alcohol: 0 Daytime voids #per day: 7 Night-time voids #per night: 0 Urgency: Mild Incontinence Episodes #per day: 0 Ankle used: Right Treatment Setting: 7 Feeling/ Response: Toe Flex & Sensory  Comments: Pt tolerated well  Performed By: Ples Specter  CMA  Follow Up: RTC in 1 week as scheduled

## 2019-12-24 NOTE — Progress Notes (Signed)
PTNS  Session # 8  Health & Social Factors: No Change  Caffeine: 2 Alcohol: 0 Daytime voids #per day: 7 Night-time voids #per night: 1 Urgency: Mild Incontinence Episodes #per day: 0 Ankle used: Right (pt request)  Treatment Setting: 5 Feeling/ Response: Sensory & Toe Flex Comments: N/A  Performed By: Debbe Bales, CMA   Follow Up: RTC in 1 week as scheduled

## 2019-12-27 ENCOUNTER — Ambulatory Visit (INDEPENDENT_AMBULATORY_CARE_PROVIDER_SITE_OTHER): Payer: PPO

## 2019-12-27 ENCOUNTER — Other Ambulatory Visit: Payer: Self-pay

## 2019-12-27 ENCOUNTER — Ambulatory Visit: Payer: Self-pay

## 2019-12-27 DIAGNOSIS — N3946 Mixed incontinence: Secondary | ICD-10-CM | POA: Diagnosis not present

## 2019-12-27 DIAGNOSIS — R4189 Other symptoms and signs involving cognitive functions and awareness: Secondary | ICD-10-CM | POA: Diagnosis not present

## 2019-12-27 DIAGNOSIS — G4733 Obstructive sleep apnea (adult) (pediatric): Secondary | ICD-10-CM | POA: Diagnosis not present

## 2019-12-27 NOTE — Patient Instructions (Signed)
Tracking Your Bladder Symptoms    Patient Name:___________________________________________________   Sample: Day   Daytime Voids  Nighttime Voids Urgency for the Day(0-4) Number of Accidents Beverage Comments  Monday IIII II 2 I Water IIII Coffee  I      Week Starting:____________________________________   Day Daytime  Voids Nighttime  Voids Urgency for  The Day(0-4) Number of Accidents Beverages Comments                                                           This week my symptoms were:  O much better  O better O the same O worse   

## 2020-01-03 ENCOUNTER — Ambulatory Visit: Payer: PPO | Admitting: Urology

## 2020-01-03 ENCOUNTER — Ambulatory Visit: Payer: Self-pay

## 2020-01-10 ENCOUNTER — Other Ambulatory Visit: Payer: Self-pay

## 2020-01-10 ENCOUNTER — Ambulatory Visit: Payer: Self-pay

## 2020-01-10 ENCOUNTER — Ambulatory Visit (INDEPENDENT_AMBULATORY_CARE_PROVIDER_SITE_OTHER): Payer: PPO | Admitting: Urology

## 2020-01-10 VITALS — BP 153/82 | HR 80

## 2020-01-10 DIAGNOSIS — N3946 Mixed incontinence: Secondary | ICD-10-CM

## 2020-01-10 NOTE — Progress Notes (Signed)
01/10/2020 11:16 AM   Stacey Warner Sep 07, 1932 [id number]  Referring provider: Marguarite Arbour, MD [address] [facility],  [place] [address]  Chief Complaint  Patient presents with  . Follow-up    HPI: I was consulted to assess the patient's worsening urinary incontinence over the last 12 months. She is a partial responder to Myrbetriq 25 mg. She describes another pill years ago that caused dry mouth.  She does not leak with coughing sneezing but has urge incontinence. She wears 5 or 6 pads a day moderately wet. She gets up sometimes once at night. She voids every 1-2 hours during the day. She does not have bedwetting  50% better on Myrbetriq.  Patient would like to try Detrol LA 4 mg in combination of the Myrbetriq and reassess in 6 weeks. I can always stop the Myrbetriq. She is willing to set her goals high and try to get drier. Percutaneous tibial nerve stimulation a good option moving forward  Patient actually stopped the Myrbetriq due to dry mouth which would be less common. She states she is happy just on Detrol. It helps moderately so.  I will see in 1 year on Detrol 90x3   Patient has ongoing urge incontinence perhaps little bit worse or more frequent.  Still on Detrol.   Patient has worsening frequency and urge incontinence.  Send urine for culture.  Discussed percutaneous tibial nerve stimulation.    Today Frequency stable.  Patient doing percutaneous tibial nerve stimulation. It looks like she is already had 10 or 11 treatments but stopped it for about 2 weeks because she thought it may be causing back pain.  She has a nonspecific mid low back pain.  We talked about this were not can to get an ultrasound.  Clinically not infected and no urine sent  We will restart the treatments of percutaneous tibial nerve stimulation it is important she gets one this week so the clinical benefit reducing urge incontinence does not wear off.   We have to get the paperwork organized.  Urge incontinence improved.  Based upon looking at her medication list no longer on tolterodine  PMH: No past medical history on file.  Surgical History: Past Surgical History:  Procedure Laterality Date  . BREAST BIOPSY Right    CORE W/CLIP X 2 - NEG    Home Medications:  Allergies as of 01/10/2020      Reactions   Amoxicillin-pot Clavulanate Rash   Ciprofloxacin    Other reaction(s): Unknown Pt doesn't remember   Clindamycin    Other reaction(s): Unknown   Minocycline    Other reaction(s): Unknown Pt states made her feel "funny"   Sucralfate    Other reaction(s): Other (See Comments) "can't take"   Sulfa Antibiotics Rash   Had a small area of rash about 30 years ago when she took sulfa, has not taken since.   Tape Rash      Medication List       Accurate as of January 10, 2020 11:16 AM. If you have any questions, ask your nurse or doctor.        aspirin EC 81 MG tablet Take 81 mg by mouth daily.   atorvastatin 10 MG tablet Commonly known as: LIPITOR Take 10 mg by mouth daily.   azelastine 0.1 % nasal spray Commonly known as: ASTELIN   Baclofen 5 MG Tabs TAKE 1 TABLET (5 MG TOTAL) BY MOUTH NIGHTLY AS NEEDED FOR UP TO 30 DAYS  CALCIUM + D PO Take by mouth.   Calcium Carbonate-Vitamin D 600-400 MG-UNIT tablet Take by mouth.   cyclobenzaprine 5 MG tablet Commonly known as: FLEXERIL TAKE 1 TABLET BY MOUTH EVERY DAY AT NIGHT   donepezil 5 MG tablet Commonly known as: ARICEPT TAKE 1 TABLET BY MOUTH EVERY DAY AT NIGHT   dorzolamide-timolol 22.3-6.8 MG/ML ophthalmic solution Commonly known as: COSOPT   famotidine 20 MG tablet Commonly known as: PEPCID Take by mouth.   fluticasone 50 MCG/ACT nasal spray Commonly known as: FLONASE   latanoprost 0.005 % ophthalmic solution Commonly known as: XALATAN   levocetirizine 5 MG tablet Commonly known as: XYZAL SMARTSIG:1 Tablet(s) By Mouth Every Evening     magnesium chloride 64 MG Tbec SR tablet Commonly known as: SLOW-MAG Take by mouth.   memantine 5 MG tablet Commonly known as: NAMENDA Take 5 mg by mouth 2 (two) times daily.   MIRALAX PO Take by mouth.   montelukast 10 MG tablet Commonly known as: SINGULAIR TAKE ONE TABLET DAILY EACH EVENING PRIOR TO BEDTIME   pantoprazole 40 MG tablet Commonly known as: PROTONIX TAKE 1 TABLET BY MOUTH TWICE A DAY   ranitidine 300 MG capsule Commonly known as: ZANTAC Take by mouth.   sertraline 50 MG tablet Commonly known as: ZOLOFT Take 50 mg by mouth daily.   tolterodine 4 MG 24 hr capsule Commonly known as: Detrol LA Take 1 capsule (4 mg total) by mouth daily.   triamcinolone cream 0.1 % Commonly known as: KENALOG Apply topically.       Allergies:  Allergies  Allergen Reactions  . Amoxicillin-Pot Clavulanate Rash  . Ciprofloxacin     Other reaction(s): Unknown Pt doesn't remember  . Clindamycin     Other reaction(s): Unknown  . Minocycline     Other reaction(s): Unknown Pt states made her feel "funny"  . Sucralfate     Other reaction(s): Other (See Comments) "can't take"  . Sulfa Antibiotics Rash    Had a small area of rash about 30 years ago when she took sulfa, has not taken since.  . Tape Rash    Family History: Family History  Problem Relation Age of Onset  . Breast cancer Neg Hx     Social History:  reports that she has never smoked. She has never used smokeless tobacco. She reports that she does not drink alcohol and does not use drugs.  ROS:                                        Physical Exam: BP (!) 153/82   Pulse 80   Constitutional:  Alert and oriented, No acute distress.   Laboratory Data: No results found for: WBC, HGB, HCT, MCV, PLT  No results found for: CREATININE  No results found for: PSA  No results found for: TESTOSTERONE  No results found for: HGBA1C  Urinalysis    Component Value Date/Time    APPEARANCEUR Clear 09/06/2019 1015   GLUCOSEU Negative 09/06/2019 1015   BILIRUBINUR Negative 09/06/2019 1015   PROTEINUR Negative 09/06/2019 1015   NITRITE Negative 09/06/2019 1015   LEUKOCYTESUR Trace (A) 09/06/2019 1015    Pertinent Imaging:   Assessment & Plan: Continue with percutaneous tibial nerve stimulation  There are no diagnoses linked to this encounter.  No follow-ups on file.  Martina Sinner, MD  [facility] [address]  [place], [place] [address] [phone]

## 2020-01-11 ENCOUNTER — Ambulatory Visit (INDEPENDENT_AMBULATORY_CARE_PROVIDER_SITE_OTHER): Payer: PPO | Admitting: *Deleted

## 2020-01-11 ENCOUNTER — Other Ambulatory Visit: Payer: Self-pay

## 2020-01-11 DIAGNOSIS — N3946 Mixed incontinence: Secondary | ICD-10-CM | POA: Diagnosis not present

## 2020-01-11 NOTE — Progress Notes (Addendum)
PTNS  Session # 11  Health & Social Factors: no change Caffeine: 1-2 Alcohol: 0 Daytime voids #per day: 7 Night-time voids #per night: 0 Urgency: mild Incontinence Episodes #per day: 0 Ankle used: right Treatment Setting: 6 Feeling/ Response: sensory Comments: Patient tolerated well  Performed By: Ples Specter  CMA  Follow Up: 1 week

## 2020-01-14 DIAGNOSIS — M79605 Pain in left leg: Secondary | ICD-10-CM | POA: Diagnosis not present

## 2020-01-14 DIAGNOSIS — M79604 Pain in right leg: Secondary | ICD-10-CM | POA: Diagnosis not present

## 2020-01-17 ENCOUNTER — Ambulatory Visit: Payer: Self-pay

## 2020-01-18 ENCOUNTER — Other Ambulatory Visit: Payer: Self-pay

## 2020-01-18 ENCOUNTER — Ambulatory Visit: Payer: Self-pay

## 2020-01-18 ENCOUNTER — Ambulatory Visit (INDEPENDENT_AMBULATORY_CARE_PROVIDER_SITE_OTHER): Payer: PPO

## 2020-01-18 DIAGNOSIS — N3946 Mixed incontinence: Secondary | ICD-10-CM | POA: Diagnosis not present

## 2020-01-18 NOTE — Patient Instructions (Signed)
Tracking Your Bladder Symptoms    Patient Name:___________________________________________________   Sample: Day   Daytime Voids  Nighttime Voids Urgency for the Day(0-4) Number of Accidents Beverage Comments  Monday IIII II 2 I Water IIII Coffee  I      Week Starting:____________________________________   Day Daytime  Voids Nighttime  Voids Urgency for  The Day(0-4) Number of Accidents Beverages Comments                                                           This week my symptoms were:  O much better  O better O the same O worse   

## 2020-01-18 NOTE — Progress Notes (Addendum)
PTNS  Session # 12  Health & Social Factors: 0 Caffeine: 0 Alcohol: 0 Daytime voids #per day: 4-6 Night-time voids #per night: 0 Urgency: 0 Incontinence Episodes #per day: 0 Ankle used: Right Treatment Setting: 4 Feeling/ Response: sensory, toe flex Comments: Patient tolerated well  Performed By: Gerarda Gunther, RMA  Follow Up: follow up as advised

## 2020-01-19 DIAGNOSIS — H401132 Primary open-angle glaucoma, bilateral, moderate stage: Secondary | ICD-10-CM | POA: Diagnosis not present

## 2020-01-25 ENCOUNTER — Other Ambulatory Visit: Payer: Self-pay

## 2020-01-25 ENCOUNTER — Ambulatory Visit (INDEPENDENT_AMBULATORY_CARE_PROVIDER_SITE_OTHER): Payer: PPO | Admitting: Family Medicine

## 2020-01-25 DIAGNOSIS — N3946 Mixed incontinence: Secondary | ICD-10-CM

## 2020-01-25 NOTE — Progress Notes (Signed)
PTNS  Session # 9  Health & Social Factors: 0 Caffeine: 0 Alcohol: 0 Daytime voids #per day: 4-6 Night-time voids #per night: 0 Urgency: 0 Incontinence Episodes #per day: 1 Ankle used: right Treatment Setting:7 Feeling/ Response: sensory, toe flex Comments: Patient tolerated well  Performed By: Teressa Lower, CMA  Follow Up:1 week , # 10

## 2020-01-26 DIAGNOSIS — Z23 Encounter for immunization: Secondary | ICD-10-CM | POA: Diagnosis not present

## 2020-01-28 DIAGNOSIS — M62830 Muscle spasm of back: Secondary | ICD-10-CM | POA: Diagnosis not present

## 2020-01-28 DIAGNOSIS — H401132 Primary open-angle glaucoma, bilateral, moderate stage: Secondary | ICD-10-CM | POA: Diagnosis not present

## 2020-01-28 DIAGNOSIS — M5136 Other intervertebral disc degeneration, lumbar region: Secondary | ICD-10-CM | POA: Diagnosis not present

## 2020-01-28 DIAGNOSIS — M4726 Other spondylosis with radiculopathy, lumbar region: Secondary | ICD-10-CM | POA: Diagnosis not present

## 2020-02-01 ENCOUNTER — Other Ambulatory Visit: Payer: Self-pay

## 2020-02-01 ENCOUNTER — Ambulatory Visit (INDEPENDENT_AMBULATORY_CARE_PROVIDER_SITE_OTHER): Payer: PPO | Admitting: *Deleted

## 2020-02-01 DIAGNOSIS — N3946 Mixed incontinence: Secondary | ICD-10-CM

## 2020-02-01 NOTE — Progress Notes (Signed)
PTNS  Session #10  Health & Social Factors:0 Caffeine:0 Alcohol:0 Daytime voids #per day:4-6 Night-time voids #per night:0 Urgency:0 Incontinence Episodes #per day:1 Ankle used:right Treatment Setting:5 Feeling/ Response:sensory, toe flex Comments:Patient tolerated well  Performed [id number] Adolpho Meenach CMA  Follow Up:1 week , # 11

## 2020-02-08 ENCOUNTER — Ambulatory Visit (INDEPENDENT_AMBULATORY_CARE_PROVIDER_SITE_OTHER): Payer: PPO | Admitting: *Deleted

## 2020-02-08 ENCOUNTER — Other Ambulatory Visit: Payer: Self-pay

## 2020-02-08 DIAGNOSIS — N3946 Mixed incontinence: Secondary | ICD-10-CM | POA: Diagnosis not present

## 2020-02-08 NOTE — Progress Notes (Signed)
PTNS  Session #11  Health & Social Factors:0 Caffeine:0 Alcohol:0 Daytime voids #per day:4-6 Night-time voids #per night:0 Urgency:0 Incontinence Episodes #per day:1 Ankle used:right Treatment Setting:2 Feeling/ Response:sensory, toe flex Comments:Patient tolerated well  CS:[id number] Nickolaus Bordelon CMA  Follow Up:1 week , #12

## 2020-02-15 ENCOUNTER — Ambulatory Visit (INDEPENDENT_AMBULATORY_CARE_PROVIDER_SITE_OTHER): Payer: PPO | Admitting: *Deleted

## 2020-02-15 ENCOUNTER — Other Ambulatory Visit: Payer: Self-pay

## 2020-02-15 DIAGNOSIS — N3946 Mixed incontinence: Secondary | ICD-10-CM

## 2020-02-15 LAB — PTNS-PERCUTANEOUS TIBIAL NERVE STIMULATION

## 2020-02-15 NOTE — Progress Notes (Signed)
PTNS  Session #12  Health & Social Factors:0 Caffeine:0 Alcohol:0 Daytime voids #per day:4-6 Night-time voids #per night:0 Urgency:0 Incontinence Episodes #per day:1 Ankle used:right Treatment Setting:2 Feeling/ Response:sensory, toe flex Comments:Patient tolerated well  [id number] Sherly Brodbeck CMA  Follow Up: one month follow up

## 2020-02-29 ENCOUNTER — Ambulatory Visit: Payer: Self-pay

## 2020-03-02 DIAGNOSIS — R8290 Unspecified abnormal findings in urine: Secondary | ICD-10-CM | POA: Diagnosis not present

## 2020-03-02 DIAGNOSIS — R739 Hyperglycemia, unspecified: Secondary | ICD-10-CM | POA: Diagnosis not present

## 2020-03-02 DIAGNOSIS — Z79899 Other long term (current) drug therapy: Secondary | ICD-10-CM | POA: Diagnosis not present

## 2020-03-02 DIAGNOSIS — E7800 Pure hypercholesterolemia, unspecified: Secondary | ICD-10-CM | POA: Diagnosis not present

## 2020-03-07 DIAGNOSIS — R739 Hyperglycemia, unspecified: Secondary | ICD-10-CM | POA: Diagnosis not present

## 2020-03-07 DIAGNOSIS — E7800 Pure hypercholesterolemia, unspecified: Secondary | ICD-10-CM | POA: Diagnosis not present

## 2020-03-07 DIAGNOSIS — Z79899 Other long term (current) drug therapy: Secondary | ICD-10-CM | POA: Diagnosis not present

## 2020-03-07 DIAGNOSIS — R413 Other amnesia: Secondary | ICD-10-CM | POA: Diagnosis not present

## 2020-03-20 ENCOUNTER — Other Ambulatory Visit: Payer: Self-pay

## 2020-03-20 ENCOUNTER — Ambulatory Visit (INDEPENDENT_AMBULATORY_CARE_PROVIDER_SITE_OTHER): Payer: PPO | Admitting: Urology

## 2020-03-20 ENCOUNTER — Encounter: Payer: Self-pay | Admitting: Urology

## 2020-03-20 ENCOUNTER — Ambulatory Visit: Payer: Self-pay

## 2020-03-20 ENCOUNTER — Telehealth: Payer: Self-pay

## 2020-03-20 VITALS — BP 118/75 | HR 71 | Ht 61.0 in | Wt 160.0 lb

## 2020-03-20 DIAGNOSIS — N3946 Mixed incontinence: Secondary | ICD-10-CM

## 2020-03-20 MED ORDER — TOLTERODINE TARTRATE ER 4 MG PO CP24
4.0000 mg | ORAL_CAPSULE | Freq: Every day | ORAL | 3 refills | Status: DC
Start: 2020-03-20 — End: 2020-10-23

## 2020-03-20 NOTE — Progress Notes (Signed)
PTNS  Session # 1st monthly Maint.   Health & Social Factors: no change Caffeine: 0 Alcohol: 0 Daytime voids #per day: 4-6 Night-time voids #per night: 0 Urgency: 0 Incontinence Episodes #per day: 1 Ankle used: right Treatment Setting: 4 Feeling/ Response: sensory Comments: patient tolerated well  Performed By: Eligha Bridegroom, CMA   Follow Up: 1 month maint

## 2020-03-20 NOTE — Telephone Encounter (Signed)
Called pt, no answer. LM for pt informing her that her appt today needs to be rescheduled as she is on the nurse schedule but should be seeing a provider for her post PTNS treatment.

## 2020-03-20 NOTE — Progress Notes (Signed)
03/20/2020 2:30 PM   Stacey Warner 03/04/1933 [id number]  Referring provider: Marguarite Arbour, MD [address] [facility] [place],  [place] [address]  Chief Complaint  Patient presents with  . Urinary Incontinence    1 month follow up    HPI: I was consulted to assess the patient's worsening urinary incontinence over the last 12 months. She is a partial responder to Myrbetriq 25 mg. She describes another pill years ago that caused dry mouth.  She does not leak with coughing sneezing but has urge incontinence. She wears 5 or 6 pads a day moderately wet. She gets up sometimes once at night. She voids every 1-2 hours during the day. She does not have bedwetting  50% better on Myrbetriq.  Patient would like to try Detrol LA 4 mg in combination of the Myrbetriq and reassess in 6 weeks. I can always stop the Myrbetriq.   Patient actually stopped the Myrbetriq due to dry mouth which would be less common. She states she is happy just on Detrol. It helps moderately so.I will see in 1 year on Detrol 90x3    Patient doing percutaneous tibial nerve stimulation. It looks like she is already had 10 or 11 treatments but stopped it for about 2 weeks because she thought it may be causing back pain.  She has a nonspecific mid low back pain.  We talked about this were not can to get an ultrasound.  Clinically not infected and no urine sent  We will restart the treatments of percutaneous tibial nerve stimulation it is important she gets one this week so the clinical benefit reducing urge incontinence does not wear off.  We have to get the paperwork organized.  Urge incontinence improved.  Based upon looking at her medication list no longer on tolterodine    Today Patient having much less urge incontinence with much greater than 50% improvement on percutaneous tibial nerve stimulation combination with Detrol.  Leaks minimally.  Frequency improved.  Very pleased.   Quality life improved.   PMH: No past medical history on file.  Surgical History: Past Surgical History:  Procedure Laterality Date  . BREAST BIOPSY Right    CORE W/CLIP X 2 - NEG    Home Medications:  Allergies as of 03/20/2020      Reactions   Amoxicillin-pot Clavulanate Rash   Ciprofloxacin    Other reaction(s): Unknown Pt doesn't remember   Clindamycin    Other reaction(s): Unknown   Minocycline    Other reaction(s): Unknown Pt states made her feel "funny"   Sucralfate    Other reaction(s): Other (See Comments) "can't take"   Sulfa Antibiotics Rash   Had a small area of rash about 30 years ago when she took sulfa, has not taken since.   Tape Rash      Medication List       Accurate as of March 20, 2020  2:30 PM. If you have any questions, ask your nurse or doctor.        aspirin EC 81 MG tablet Take 81 mg by mouth daily.   atorvastatin 10 MG tablet Commonly known as: LIPITOR Take 10 mg by mouth daily.   azelastine 0.1 % nasal spray Commonly known as: ASTELIN   Baclofen 5 MG Tabs TAKE 1 TABLET (5 MG TOTAL) BY MOUTH NIGHTLY AS NEEDED FOR UP TO 30 DAYS   CALCIUM + D PO Take by mouth.   Calcium Carbonate-Vitamin D 600-400 MG-UNIT tablet Take  by mouth.   cyclobenzaprine 5 MG tablet Commonly known as: FLEXERIL TAKE 1 TABLET BY MOUTH EVERY DAY AT NIGHT   donepezil 5 MG tablet Commonly known as: ARICEPT TAKE 1 TABLET BY MOUTH EVERY DAY AT NIGHT   dorzolamide-timolol 22.3-6.8 MG/ML ophthalmic solution Commonly known as: COSOPT   famotidine 20 MG tablet Commonly known as: PEPCID Take by mouth.   fluticasone 50 MCG/ACT nasal spray Commonly known as: FLONASE   latanoprost 0.005 % ophthalmic solution Commonly known as: XALATAN   levocetirizine 5 MG tablet Commonly known as: XYZAL SMARTSIG:1 Tablet(s) By Mouth Every Evening   magnesium chloride 64 MG Tbec SR tablet Commonly known as: SLOW-MAG Take by mouth.   memantine 5 MG  tablet Commonly known as: NAMENDA Take 5 mg by mouth 2 (two) times daily.   MIRALAX PO Take by mouth.   montelukast 10 MG tablet Commonly known as: SINGULAIR TAKE ONE TABLET DAILY EACH EVENING PRIOR TO BEDTIME   pantoprazole 40 MG tablet Commonly known as: PROTONIX TAKE 1 TABLET BY MOUTH TWICE A DAY   ranitidine 300 MG capsule Commonly known as: ZANTAC Take by mouth.   sertraline 50 MG tablet Commonly known as: ZOLOFT Take 50 mg by mouth daily.   tolterodine 4 MG 24 hr capsule Commonly known as: Detrol LA Take 1 capsule (4 mg total) by mouth daily.   triamcinolone 0.1 % Commonly known as: KENALOG Apply topically.       Allergies:  Allergies  Allergen Reactions  . Amoxicillin-Pot Clavulanate Rash  . Ciprofloxacin     Other reaction(s): Unknown Pt doesn't remember  . Clindamycin     Other reaction(s): Unknown  . Minocycline     Other reaction(s): Unknown Pt states made her feel "funny"  . Sucralfate     Other reaction(s): Other (See Comments) "can't take"  . Sulfa Antibiotics Rash    Had a small area of rash about 30 years ago when she took sulfa, has not taken since.  . Tape Rash    Family History: Family History  Problem Relation Age of Onset  . Breast cancer Neg Hx     Social History:  reports that she has never smoked. She has never used smokeless tobacco. She reports that she does not drink alcohol and does not use drugs.  ROS:                                        Physical Exam: BP 118/75   Pulse 71   Ht 5\' 1"  (1.549 m)   Wt 160 lb (72.6 kg)   BMI 30.23 kg/m    Laboratory Data: No results found for: WBC, HGB, HCT, MCV, PLT  No results found for: CREATININE  No results found for: PSA  No results found for: TESTOSTERONE  No results found for: HGBA1C  Urinalysis    Component Value Date/Time   APPEARANCEUR Clear 09/06/2019 1015   GLUCOSEU Negative 09/06/2019 1015   BILIRUBINUR Negative 09/06/2019 1015    PROTEINUR Negative 09/06/2019 1015   NITRITE Negative 09/06/2019 1015   LEUKOCYTESUR Trace (A) 09/06/2019 1015    Pertinent Imaging:   Assessment & Plan: Prescription renewed.  Continue with percutaneous tibial nerve stimulation.  I will see her near  There are no diagnoses linked to this encounter.  No follow-ups on file.  09/08/2019, MD  [facility] [address]  [place], [place] [address] [phone]

## 2020-04-18 ENCOUNTER — Ambulatory Visit: Payer: Self-pay

## 2020-04-25 ENCOUNTER — Other Ambulatory Visit: Payer: Self-pay

## 2020-04-25 ENCOUNTER — Ambulatory Visit: Payer: PPO

## 2020-04-25 DIAGNOSIS — N3946 Mixed incontinence: Secondary | ICD-10-CM

## 2020-04-25 NOTE — Progress Notes (Signed)
PTNS  Session # Monthly Maintenance    Health & Social Factors: No Change  Caffeine: 0-1 Alcohol: 0 Daytime voids #per day: 6 Night-time voids #per night: 0 Urgency: Strong  Incontinence Episodes #per day: 0-1 Ankle used: Right  Treatment Setting: 10 Feeling/ Response: Toe Flex & Sensory  Comments: N/A   Performed By: Debbe Bales, CMA   Follow Up: RTC 1 month

## 2020-04-27 DIAGNOSIS — E612 Magnesium deficiency: Secondary | ICD-10-CM | POA: Diagnosis not present

## 2020-04-27 DIAGNOSIS — G2581 Restless legs syndrome: Secondary | ICD-10-CM | POA: Diagnosis not present

## 2020-04-27 DIAGNOSIS — R29898 Other symptoms and signs involving the musculoskeletal system: Secondary | ICD-10-CM | POA: Diagnosis not present

## 2020-04-27 DIAGNOSIS — R790 Abnormal level of blood mineral: Secondary | ICD-10-CM | POA: Diagnosis not present

## 2020-04-27 DIAGNOSIS — E538 Deficiency of other specified B group vitamins: Secondary | ICD-10-CM | POA: Diagnosis not present

## 2020-04-27 DIAGNOSIS — E611 Iron deficiency: Secondary | ICD-10-CM | POA: Diagnosis not present

## 2020-04-27 DIAGNOSIS — M25511 Pain in right shoulder: Secondary | ICD-10-CM | POA: Diagnosis not present

## 2020-05-03 DIAGNOSIS — R790 Abnormal level of blood mineral: Secondary | ICD-10-CM | POA: Diagnosis not present

## 2020-05-03 DIAGNOSIS — E611 Iron deficiency: Secondary | ICD-10-CM | POA: Diagnosis not present

## 2020-05-03 DIAGNOSIS — E538 Deficiency of other specified B group vitamins: Secondary | ICD-10-CM | POA: Diagnosis not present

## 2020-05-03 DIAGNOSIS — R29898 Other symptoms and signs involving the musculoskeletal system: Secondary | ICD-10-CM | POA: Diagnosis not present

## 2020-05-03 DIAGNOSIS — G2581 Restless legs syndrome: Secondary | ICD-10-CM | POA: Diagnosis not present

## 2020-05-03 DIAGNOSIS — E612 Magnesium deficiency: Secondary | ICD-10-CM | POA: Diagnosis not present

## 2020-05-10 DIAGNOSIS — M7521 Bicipital tendinitis, right shoulder: Secondary | ICD-10-CM | POA: Diagnosis not present

## 2020-05-10 DIAGNOSIS — M7581 Other shoulder lesions, right shoulder: Secondary | ICD-10-CM | POA: Diagnosis not present

## 2020-05-23 ENCOUNTER — Ambulatory Visit (INDEPENDENT_AMBULATORY_CARE_PROVIDER_SITE_OTHER): Payer: PPO | Admitting: Physician Assistant

## 2020-05-23 ENCOUNTER — Other Ambulatory Visit: Payer: Self-pay

## 2020-05-23 DIAGNOSIS — N3946 Mixed incontinence: Secondary | ICD-10-CM | POA: Diagnosis not present

## 2020-05-23 NOTE — Progress Notes (Signed)
PTNS  Session # Monthly Maintenance   Health & Social Factors: Monthly Maintenance  Caffeine: 0-1 Alcohol: 0 Daytime voids #per day: 6 Night-time voids #per night: 0 Urgency: Strong Incontinence Episodes #per day: 0-1 Ankle used: Right Treatment Setting: 3 Feeling/ Response: Sensory & Tow Flex Comments: N/A  Performed By: Debbe Bales, CMA   Follow Up: RTC in 1 month for PTNS

## 2020-06-06 DIAGNOSIS — E785 Hyperlipidemia, unspecified: Secondary | ICD-10-CM | POA: Diagnosis not present

## 2020-06-06 DIAGNOSIS — M7521 Bicipital tendinitis, right shoulder: Secondary | ICD-10-CM | POA: Diagnosis not present

## 2020-06-06 DIAGNOSIS — N6019 Diffuse cystic mastopathy of unspecified breast: Secondary | ICD-10-CM | POA: Diagnosis not present

## 2020-06-06 DIAGNOSIS — Z79899 Other long term (current) drug therapy: Secondary | ICD-10-CM | POA: Diagnosis not present

## 2020-06-06 DIAGNOSIS — M7541 Impingement syndrome of right shoulder: Secondary | ICD-10-CM | POA: Diagnosis not present

## 2020-06-06 DIAGNOSIS — K219 Gastro-esophageal reflux disease without esophagitis: Secondary | ICD-10-CM | POA: Diagnosis not present

## 2020-06-06 DIAGNOSIS — H9193 Unspecified hearing loss, bilateral: Secondary | ICD-10-CM | POA: Diagnosis not present

## 2020-06-06 DIAGNOSIS — H409 Unspecified glaucoma: Secondary | ICD-10-CM | POA: Diagnosis not present

## 2020-06-06 DIAGNOSIS — Z9089 Acquired absence of other organs: Secondary | ICD-10-CM | POA: Diagnosis not present

## 2020-06-06 DIAGNOSIS — Z8701 Personal history of pneumonia (recurrent): Secondary | ICD-10-CM | POA: Diagnosis not present

## 2020-06-06 DIAGNOSIS — Z7982 Long term (current) use of aspirin: Secondary | ICD-10-CM | POA: Diagnosis not present

## 2020-06-06 DIAGNOSIS — Z9181 History of falling: Secondary | ICD-10-CM | POA: Diagnosis not present

## 2020-06-06 DIAGNOSIS — N3281 Overactive bladder: Secondary | ICD-10-CM | POA: Diagnosis not present

## 2020-06-06 DIAGNOSIS — G4730 Sleep apnea, unspecified: Secondary | ICD-10-CM | POA: Diagnosis not present

## 2020-06-06 DIAGNOSIS — K5790 Diverticulosis of intestine, part unspecified, without perforation or abscess without bleeding: Secondary | ICD-10-CM | POA: Diagnosis not present

## 2020-06-12 DIAGNOSIS — Z79899 Other long term (current) drug therapy: Secondary | ICD-10-CM | POA: Diagnosis not present

## 2020-06-12 DIAGNOSIS — N3281 Overactive bladder: Secondary | ICD-10-CM | POA: Diagnosis not present

## 2020-06-12 DIAGNOSIS — K219 Gastro-esophageal reflux disease without esophagitis: Secondary | ICD-10-CM | POA: Diagnosis not present

## 2020-06-12 DIAGNOSIS — G4730 Sleep apnea, unspecified: Secondary | ICD-10-CM | POA: Diagnosis not present

## 2020-06-12 DIAGNOSIS — Z8701 Personal history of pneumonia (recurrent): Secondary | ICD-10-CM | POA: Diagnosis not present

## 2020-06-12 DIAGNOSIS — M7521 Bicipital tendinitis, right shoulder: Secondary | ICD-10-CM | POA: Diagnosis not present

## 2020-06-12 DIAGNOSIS — Z9181 History of falling: Secondary | ICD-10-CM | POA: Diagnosis not present

## 2020-06-12 DIAGNOSIS — M7541 Impingement syndrome of right shoulder: Secondary | ICD-10-CM | POA: Diagnosis not present

## 2020-06-12 DIAGNOSIS — Z7982 Long term (current) use of aspirin: Secondary | ICD-10-CM | POA: Diagnosis not present

## 2020-06-12 DIAGNOSIS — H9193 Unspecified hearing loss, bilateral: Secondary | ICD-10-CM | POA: Diagnosis not present

## 2020-06-12 DIAGNOSIS — H409 Unspecified glaucoma: Secondary | ICD-10-CM | POA: Diagnosis not present

## 2020-06-12 DIAGNOSIS — Z9089 Acquired absence of other organs: Secondary | ICD-10-CM | POA: Diagnosis not present

## 2020-06-12 DIAGNOSIS — E785 Hyperlipidemia, unspecified: Secondary | ICD-10-CM | POA: Diagnosis not present

## 2020-06-12 DIAGNOSIS — K5790 Diverticulosis of intestine, part unspecified, without perforation or abscess without bleeding: Secondary | ICD-10-CM | POA: Diagnosis not present

## 2020-06-12 DIAGNOSIS — N6019 Diffuse cystic mastopathy of unspecified breast: Secondary | ICD-10-CM | POA: Diagnosis not present

## 2020-06-16 DIAGNOSIS — Z79899 Other long term (current) drug therapy: Secondary | ICD-10-CM | POA: Diagnosis not present

## 2020-06-21 DIAGNOSIS — M7581 Other shoulder lesions, right shoulder: Secondary | ICD-10-CM | POA: Diagnosis not present

## 2020-06-21 DIAGNOSIS — M7521 Bicipital tendinitis, right shoulder: Secondary | ICD-10-CM | POA: Diagnosis not present

## 2020-06-23 ENCOUNTER — Other Ambulatory Visit: Payer: Self-pay | Admitting: Internal Medicine

## 2020-06-23 DIAGNOSIS — Z1231 Encounter for screening mammogram for malignant neoplasm of breast: Secondary | ICD-10-CM | POA: Diagnosis not present

## 2020-06-23 DIAGNOSIS — R413 Other amnesia: Secondary | ICD-10-CM | POA: Diagnosis not present

## 2020-06-23 DIAGNOSIS — Z79899 Other long term (current) drug therapy: Secondary | ICD-10-CM | POA: Diagnosis not present

## 2020-06-23 DIAGNOSIS — G4733 Obstructive sleep apnea (adult) (pediatric): Secondary | ICD-10-CM | POA: Diagnosis not present

## 2020-06-23 DIAGNOSIS — R739 Hyperglycemia, unspecified: Secondary | ICD-10-CM | POA: Diagnosis not present

## 2020-06-23 DIAGNOSIS — E7800 Pure hypercholesterolemia, unspecified: Secondary | ICD-10-CM | POA: Diagnosis not present

## 2020-06-23 DIAGNOSIS — Z0000 Encounter for general adult medical examination without abnormal findings: Secondary | ICD-10-CM | POA: Diagnosis not present

## 2020-06-26 ENCOUNTER — Ambulatory Visit: Payer: PPO

## 2020-06-26 ENCOUNTER — Other Ambulatory Visit: Payer: Self-pay

## 2020-06-26 DIAGNOSIS — N3946 Mixed incontinence: Secondary | ICD-10-CM

## 2020-06-26 NOTE — Progress Notes (Signed)
PTNS  Session # Monthly Maintenance   Health & Social Factors: No Change Caffeine: 1 Alcohol: 0 Daytime voids #per day: 6 Night-time voids #per night: 0 Urgency: Mild  Incontinence Episodes #per day: 0 Ankle used: Right Treatment Setting: 1 Feeling/ Response: Sensory & Toe Flex Comments: N/A   Performed By: Debbe Bales, CMA   Follow Up: RTC in 1 month

## 2020-06-28 DIAGNOSIS — H409 Unspecified glaucoma: Secondary | ICD-10-CM | POA: Diagnosis not present

## 2020-06-28 DIAGNOSIS — Z7982 Long term (current) use of aspirin: Secondary | ICD-10-CM | POA: Diagnosis not present

## 2020-06-28 DIAGNOSIS — G4730 Sleep apnea, unspecified: Secondary | ICD-10-CM | POA: Diagnosis not present

## 2020-06-28 DIAGNOSIS — N3281 Overactive bladder: Secondary | ICD-10-CM | POA: Diagnosis not present

## 2020-06-28 DIAGNOSIS — Z9089 Acquired absence of other organs: Secondary | ICD-10-CM | POA: Diagnosis not present

## 2020-06-28 DIAGNOSIS — Z8701 Personal history of pneumonia (recurrent): Secondary | ICD-10-CM | POA: Diagnosis not present

## 2020-06-28 DIAGNOSIS — K219 Gastro-esophageal reflux disease without esophagitis: Secondary | ICD-10-CM | POA: Diagnosis not present

## 2020-06-28 DIAGNOSIS — N6019 Diffuse cystic mastopathy of unspecified breast: Secondary | ICD-10-CM | POA: Diagnosis not present

## 2020-06-28 DIAGNOSIS — Z79899 Other long term (current) drug therapy: Secondary | ICD-10-CM | POA: Diagnosis not present

## 2020-06-28 DIAGNOSIS — K5790 Diverticulosis of intestine, part unspecified, without perforation or abscess without bleeding: Secondary | ICD-10-CM | POA: Diagnosis not present

## 2020-06-28 DIAGNOSIS — M7521 Bicipital tendinitis, right shoulder: Secondary | ICD-10-CM | POA: Diagnosis not present

## 2020-06-28 DIAGNOSIS — Z9181 History of falling: Secondary | ICD-10-CM | POA: Diagnosis not present

## 2020-06-28 DIAGNOSIS — M7541 Impingement syndrome of right shoulder: Secondary | ICD-10-CM | POA: Diagnosis not present

## 2020-06-28 DIAGNOSIS — H9193 Unspecified hearing loss, bilateral: Secondary | ICD-10-CM | POA: Diagnosis not present

## 2020-06-28 DIAGNOSIS — E785 Hyperlipidemia, unspecified: Secondary | ICD-10-CM | POA: Diagnosis not present

## 2020-07-05 DIAGNOSIS — H903 Sensorineural hearing loss, bilateral: Secondary | ICD-10-CM | POA: Diagnosis not present

## 2020-07-12 DIAGNOSIS — M7521 Bicipital tendinitis, right shoulder: Secondary | ICD-10-CM | POA: Diagnosis not present

## 2020-07-12 DIAGNOSIS — H409 Unspecified glaucoma: Secondary | ICD-10-CM | POA: Diagnosis not present

## 2020-07-12 DIAGNOSIS — M7541 Impingement syndrome of right shoulder: Secondary | ICD-10-CM | POA: Diagnosis not present

## 2020-07-12 DIAGNOSIS — Z9089 Acquired absence of other organs: Secondary | ICD-10-CM | POA: Diagnosis not present

## 2020-07-12 DIAGNOSIS — Z79899 Other long term (current) drug therapy: Secondary | ICD-10-CM | POA: Diagnosis not present

## 2020-07-12 DIAGNOSIS — Z8701 Personal history of pneumonia (recurrent): Secondary | ICD-10-CM | POA: Diagnosis not present

## 2020-07-12 DIAGNOSIS — N6019 Diffuse cystic mastopathy of unspecified breast: Secondary | ICD-10-CM | POA: Diagnosis not present

## 2020-07-12 DIAGNOSIS — Z9181 History of falling: Secondary | ICD-10-CM | POA: Diagnosis not present

## 2020-07-12 DIAGNOSIS — H9193 Unspecified hearing loss, bilateral: Secondary | ICD-10-CM | POA: Diagnosis not present

## 2020-07-12 DIAGNOSIS — K219 Gastro-esophageal reflux disease without esophagitis: Secondary | ICD-10-CM | POA: Diagnosis not present

## 2020-07-12 DIAGNOSIS — N3281 Overactive bladder: Secondary | ICD-10-CM | POA: Diagnosis not present

## 2020-07-12 DIAGNOSIS — K5790 Diverticulosis of intestine, part unspecified, without perforation or abscess without bleeding: Secondary | ICD-10-CM | POA: Diagnosis not present

## 2020-07-12 DIAGNOSIS — Z7982 Long term (current) use of aspirin: Secondary | ICD-10-CM | POA: Diagnosis not present

## 2020-07-12 DIAGNOSIS — E785 Hyperlipidemia, unspecified: Secondary | ICD-10-CM | POA: Diagnosis not present

## 2020-07-12 DIAGNOSIS — G4730 Sleep apnea, unspecified: Secondary | ICD-10-CM | POA: Diagnosis not present

## 2020-07-19 ENCOUNTER — Other Ambulatory Visit: Payer: Self-pay | Admitting: Student

## 2020-07-19 DIAGNOSIS — M4807 Spinal stenosis, lumbosacral region: Secondary | ICD-10-CM

## 2020-07-19 DIAGNOSIS — M7581 Other shoulder lesions, right shoulder: Secondary | ICD-10-CM | POA: Diagnosis not present

## 2020-07-19 DIAGNOSIS — M5136 Other intervertebral disc degeneration, lumbar region: Secondary | ICD-10-CM | POA: Diagnosis not present

## 2020-07-19 DIAGNOSIS — M7521 Bicipital tendinitis, right shoulder: Secondary | ICD-10-CM | POA: Diagnosis not present

## 2020-07-31 DIAGNOSIS — J301 Allergic rhinitis due to pollen: Secondary | ICD-10-CM | POA: Diagnosis not present

## 2020-07-31 DIAGNOSIS — H903 Sensorineural hearing loss, bilateral: Secondary | ICD-10-CM | POA: Diagnosis not present

## 2020-07-31 DIAGNOSIS — H6123 Impacted cerumen, bilateral: Secondary | ICD-10-CM | POA: Diagnosis not present

## 2020-07-31 DIAGNOSIS — H353131 Nonexudative age-related macular degeneration, bilateral, early dry stage: Secondary | ICD-10-CM | POA: Diagnosis not present

## 2020-08-01 ENCOUNTER — Other Ambulatory Visit: Payer: Self-pay

## 2020-08-01 ENCOUNTER — Ambulatory Visit
Admission: RE | Admit: 2020-08-01 | Discharge: 2020-08-01 | Disposition: A | Discharge status: Home / Self Care | Payer: PPO | Source: Ambulatory Visit | Attending: Student | Admitting: Student

## 2020-08-01 DIAGNOSIS — M545 Low back pain, unspecified: Secondary | ICD-10-CM | POA: Diagnosis not present

## 2020-08-01 DIAGNOSIS — M4807 Spinal stenosis, lumbosacral region: Secondary | ICD-10-CM | POA: Diagnosis not present

## 2020-08-01 DIAGNOSIS — M5136 Other intervertebral disc degeneration, lumbar region: Secondary | ICD-10-CM | POA: Insufficient documentation

## 2020-08-02 DIAGNOSIS — L821 Other seborrheic keratosis: Secondary | ICD-10-CM | POA: Diagnosis not present

## 2020-08-02 DIAGNOSIS — L720 Epidermal cyst: Secondary | ICD-10-CM | POA: Diagnosis not present

## 2020-08-04 ENCOUNTER — Other Ambulatory Visit: Payer: Self-pay

## 2020-08-04 ENCOUNTER — Ambulatory Visit (INDEPENDENT_AMBULATORY_CARE_PROVIDER_SITE_OTHER): Payer: PPO | Admitting: Family Medicine

## 2020-08-04 DIAGNOSIS — N3946 Mixed incontinence: Secondary | ICD-10-CM

## 2020-08-04 NOTE — Progress Notes (Signed)
PTNS  Session # Monthly maintenance  Health & Social Factors: No change Caffeine: 1 Alcohol: 0 Daytime voids #per day: 6 Night-time voids #per night: 0 Urgency: mild Incontinence Episodes #per day: 0 Ankle used: right Treatment Setting: 7 Feeling/ Response: sensory Comments: Patient tolerated well  Performed By: Teressa Lower, CMA  Follow Up: 1 month PTNS

## 2020-08-09 DIAGNOSIS — M5136 Other intervertebral disc degeneration, lumbar region: Secondary | ICD-10-CM | POA: Diagnosis not present

## 2020-08-09 DIAGNOSIS — M48062 Spinal stenosis, lumbar region with neurogenic claudication: Secondary | ICD-10-CM | POA: Diagnosis not present

## 2020-08-15 DIAGNOSIS — M48062 Spinal stenosis, lumbar region with neurogenic claudication: Secondary | ICD-10-CM | POA: Diagnosis not present

## 2020-08-15 DIAGNOSIS — M5416 Radiculopathy, lumbar region: Secondary | ICD-10-CM | POA: Diagnosis not present

## 2020-08-15 DIAGNOSIS — M5136 Other intervertebral disc degeneration, lumbar region: Secondary | ICD-10-CM | POA: Diagnosis not present

## 2020-08-28 ENCOUNTER — Ambulatory Visit
Admission: RE | Admit: 2020-08-28 | Discharge: 2020-08-28 | Disposition: A | Discharge status: Home / Self Care | Payer: PPO | Source: Ambulatory Visit | Attending: Internal Medicine | Admitting: Internal Medicine

## 2020-08-28 ENCOUNTER — Other Ambulatory Visit: Payer: Self-pay

## 2020-08-28 DIAGNOSIS — Z1231 Encounter for screening mammogram for malignant neoplasm of breast: Secondary | ICD-10-CM | POA: Insufficient documentation

## 2020-09-01 ENCOUNTER — Ambulatory Visit (INDEPENDENT_AMBULATORY_CARE_PROVIDER_SITE_OTHER): Payer: PPO | Admitting: *Deleted

## 2020-09-01 ENCOUNTER — Other Ambulatory Visit: Payer: Self-pay

## 2020-09-01 DIAGNOSIS — N3946 Mixed incontinence: Secondary | ICD-10-CM | POA: Diagnosis not present

## 2020-09-04 DIAGNOSIS — G2581 Restless legs syndrome: Secondary | ICD-10-CM | POA: Diagnosis not present

## 2020-09-04 DIAGNOSIS — R29898 Other symptoms and signs involving the musculoskeletal system: Secondary | ICD-10-CM | POA: Diagnosis not present

## 2020-09-05 DIAGNOSIS — M48062 Spinal stenosis, lumbar region with neurogenic claudication: Secondary | ICD-10-CM | POA: Diagnosis not present

## 2020-09-05 DIAGNOSIS — M5136 Other intervertebral disc degeneration, lumbar region: Secondary | ICD-10-CM | POA: Diagnosis not present

## 2020-09-05 DIAGNOSIS — M47816 Spondylosis without myelopathy or radiculopathy, lumbar region: Secondary | ICD-10-CM | POA: Diagnosis not present

## 2020-09-11 ENCOUNTER — Ambulatory Visit: Payer: Self-pay | Admitting: Urology

## 2020-09-14 DIAGNOSIS — M47816 Spondylosis without myelopathy or radiculopathy, lumbar region: Secondary | ICD-10-CM | POA: Diagnosis not present

## 2020-10-02 ENCOUNTER — Ambulatory Visit: Payer: Self-pay

## 2020-10-02 DIAGNOSIS — M47816 Spondylosis without myelopathy or radiculopathy, lumbar region: Secondary | ICD-10-CM | POA: Diagnosis not present

## 2020-10-09 ENCOUNTER — Other Ambulatory Visit: Payer: Self-pay

## 2020-10-09 ENCOUNTER — Ambulatory Visit (INDEPENDENT_AMBULATORY_CARE_PROVIDER_SITE_OTHER): Payer: PPO | Admitting: *Deleted

## 2020-10-09 DIAGNOSIS — N3946 Mixed incontinence: Secondary | ICD-10-CM

## 2020-10-09 NOTE — Progress Notes (Signed)
PTNS   Session # Monthly maintenance   Health & Social Factors: No change Caffeine: 1 Alcohol: 0 Daytime voids #per day: 9 Night-time voids #per night: 0 Urgency: mild Incontinence Episodes #per day: 0 Ankle used: right Treatment Setting: 4 Feeling/ Response: sensory Comments: Patient tolerated well, Patient has been going to the restroom a lot more.    Performed By: Ples Specter CMA   Follow Up: 1 month PTNS

## 2020-10-16 DIAGNOSIS — M47816 Spondylosis without myelopathy or radiculopathy, lumbar region: Secondary | ICD-10-CM | POA: Diagnosis not present

## 2020-10-16 DIAGNOSIS — M48062 Spinal stenosis, lumbar region with neurogenic claudication: Secondary | ICD-10-CM | POA: Diagnosis not present

## 2020-10-16 DIAGNOSIS — M5136 Other intervertebral disc degeneration, lumbar region: Secondary | ICD-10-CM | POA: Diagnosis not present

## 2020-10-23 ENCOUNTER — Other Ambulatory Visit: Payer: Self-pay

## 2020-10-23 ENCOUNTER — Ambulatory Visit: Payer: PPO | Admitting: Urology

## 2020-10-23 ENCOUNTER — Encounter: Payer: Self-pay | Admitting: Urology

## 2020-10-23 VITALS — BP 136/84 | HR 68 | Ht 61.0 in | Wt 160.0 lb

## 2020-10-23 DIAGNOSIS — N3946 Mixed incontinence: Secondary | ICD-10-CM

## 2020-10-23 LAB — URINALYSIS, COMPLETE
Bilirubin, UA: NEGATIVE
Glucose, UA: NEGATIVE
Ketones, UA: NEGATIVE
Nitrite, UA: NEGATIVE
Protein,UA: NEGATIVE
RBC, UA: NEGATIVE
Specific Gravity, UA: 1.02 (ref 1.005–1.030)
Urobilinogen, Ur: 0.2 mg/dL (ref 0.2–1.0)
pH, UA: 5 (ref 5.0–7.5)

## 2020-10-23 LAB — MICROSCOPIC EXAMINATION

## 2020-10-23 MED ORDER — TOLTERODINE TARTRATE ER 4 MG PO CP24: 4.0000 mg | ORAL_CAPSULE | Freq: Every day | ORAL | 3 refills | Status: DC

## 2020-10-23 NOTE — Progress Notes (Signed)
10/23/2020 9:37 AM   Stacey Warner 1932-07-14 [id number]  Referring provider: Marguarite Arbour, MD [address] [facility] [place],  [place] [address]  No chief complaint on file.   HPI: I was consulted to assess the patient's worsening urinary incontinence over the last 12 months.  She is a partial responder to Myrbetriq 25 mg.  She describes another pill years ago that caused dry mouth.   She does not leak with coughing sneezing but has urge incontinence.  She wears 5 or 6 pads a day moderately wet.  She gets up sometimes once at night.  She voids every 1-2 hours during the day.  She does not have bedwetting   50% better on Myrbetriq.  Patient would like to try Detrol LA 4 mg in combination of the Myrbetriq and reassess in 6 weeks.  I can always stop the Myrbetriq.  She is willing to set her goals high and try to get drier.  Percutaneous tibial nerve stimulation a good option moving forward    Patient actually stopped the Myrbetriq due to dry mouth which would be less common.  She states she is happy just on Detrol.  It helps moderately so.   I will see in 1 year on Detrol 90x3   Patient has ongoing urge incontinence perhaps little bit worse or more frequent.  Still on Detrol.   Patient has worsening frequency and urge incontinence.  Send urine for culture.  Discussed percutaneous tibial nerve stimulation.     Today Frequency stable.  Patient doing percutaneous tibial nerve stimulation. It looks like she is already had 10 or 11 treatments but stopped it for about 2 weeks because she thought it may be causing back pain.  She has a nonspecific mid low back pain.  We talked about this were not can to get an ultrasound.  Clinically not infected and no urine sent   We will restart the treatments of percutaneous tibial nerve stimulation it is important she gets one this week so the clinical benefit reducing urge incontinence does not wear off.  We have to get the  paperwork organized.  Urge incontinence improved.  Based upon looking at her medication list no longer on tolterodine    Today Frequency stable.  He thinks he is leaking more.  The visit is similar to the last 1 where she is not sure she still doing the ankle treatments.  Clinically not infected.  Fill on   PMH: No past medical history on file.  Surgical History: Past Surgical History:  Procedure Laterality Date   BREAST BIOPSY Right    CORE W/CLIP X 2 - NEG    Home Medications:  Allergies as of 10/23/2020       Reactions   Amoxicillin-pot Clavulanate Rash   Ciprofloxacin    Other reaction(s): Unknown Pt doesn't remember   Clindamycin    Other reaction(s): Unknown   Minocycline    Other reaction(s): Unknown Pt states made her feel "funny"   Sucralfate    Other reaction(s): Other (See Comments) "can't take"   Sulfa Antibiotics Rash   Had a small area of rash about 30 years ago when she took sulfa, has not taken since.   Tape Rash        Medication List        Accurate as of October 23, 2020  9:37 AM. If you have any questions, ask your nurse or doctor.  aspirin EC 81 MG tablet Take 81 mg by mouth daily.   atorvastatin 10 MG tablet Commonly known as: LIPITOR Take 10 mg by mouth daily.   azelastine 0.1 % nasal spray Commonly known as: ASTELIN   Baclofen 5 MG Tabs TAKE 1 TABLET (5 MG TOTAL) BY MOUTH NIGHTLY AS NEEDED FOR UP TO 30 DAYS   CALCIUM + D PO Take by mouth.   Calcium Carbonate-Vitamin D 600-400 MG-UNIT tablet Take by mouth.   cyclobenzaprine 5 MG tablet Commonly known as: FLEXERIL TAKE 1 TABLET BY MOUTH EVERY DAY AT NIGHT   donepezil 5 MG tablet Commonly known as: ARICEPT TAKE 1 TABLET BY MOUTH EVERY DAY AT NIGHT   dorzolamide-timolol 22.3-6.8 MG/ML ophthalmic solution Commonly known as: COSOPT   famotidine 20 MG tablet Commonly known as: PEPCID Take by mouth.   fluticasone 50 MCG/ACT nasal spray Commonly known as: FLONASE    latanoprost 0.005 % ophthalmic solution Commonly known as: XALATAN   levocetirizine 5 MG tablet Commonly known as: XYZAL SMARTSIG:1 Tablet(s) By Mouth Every Evening   magnesium chloride 64 MG Tbec SR tablet Commonly known as: SLOW-MAG Take by mouth.   memantine 5 MG tablet Commonly known as: NAMENDA Take 5 mg by mouth 2 (two) times daily.   MIRALAX PO Take by mouth.   montelukast 10 MG tablet Commonly known as: SINGULAIR TAKE ONE TABLET DAILY EACH EVENING PRIOR TO BEDTIME   pantoprazole 40 MG tablet Commonly known as: PROTONIX TAKE 1 TABLET BY MOUTH TWICE A DAY   ranitidine 300 MG capsule Commonly known as: ZANTAC Take by mouth.   sertraline 50 MG tablet Commonly known as: ZOLOFT Take 50 mg by mouth daily.   tolterodine 4 MG 24 hr capsule Commonly known as: Detrol LA Take 1 capsule (4 mg total) by mouth daily.   triamcinolone cream 0.1 % Commonly known as: KENALOG Apply topically.        Allergies:  Allergies  Allergen Reactions   Amoxicillin-Pot Clavulanate Rash   Ciprofloxacin     Other reaction(s): Unknown Pt doesn't remember   Clindamycin     Other reaction(s): Unknown   Minocycline     Other reaction(s): Unknown Pt states made her feel "funny"   Sucralfate     Other reaction(s): Other (See Comments) "can't take"   Sulfa Antibiotics Rash    Had a small area of rash about 30 years ago when she took sulfa, has not taken since.   Tape Rash    Family History: Family History  Problem Relation Age of Onset   Breast cancer Neg Hx     Social History:  reports that she has never smoked. She has never used smokeless tobacco. She reports that she does not drink alcohol and does not use drugs.  ROS:                                        Physical Exam: There were no vitals taken for this visit.  Constitutional:  Alert and oriented, No acute distress. HEENT: [place] AT, moist mucus membranes.  Trachea midline, no  masses. Cardiovascular: No clubbing, cyanosis, or edema.   Laboratory Data: No results found for: WBC, HGB, HCT, MCV, PLT  No results found for: CREATININE  No results found for: PSA  No results found for: TESTOSTERONE  No results found for: HGBA1C  Urinalysis    Component Value Date/Time  APPEARANCEUR Clear 09/06/2019 1015   GLUCOSEU Negative 09/06/2019 1015   BILIRUBINUR Negative 09/06/2019 1015   PROTEINUR Negative 09/06/2019 1015   NITRITE Negative 09/06/2019 1015   LEUKOCYTESUR Trace (A) 09/06/2019 1015    Pertinent Imaging:   Assessment & Plan: Renew Detrol.  Continue with percutaneous tibial nerve stimulation.  Call if urine culture is sent and positive.  Given new beta 3 agonists and if it works prescribe it and added to the [employer].  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  There are no diagnoses linked to this encounter.  No follow-ups on file.  Martina Sinner, MD  [facility] [address] [place], [place] [address] [phone]

## 2020-10-25 LAB — CULTURE, URINE COMPREHENSIVE

## 2020-10-30 DIAGNOSIS — Z79899 Other long term (current) drug therapy: Secondary | ICD-10-CM | POA: Diagnosis not present

## 2020-10-30 DIAGNOSIS — M48062 Spinal stenosis, lumbar region with neurogenic claudication: Secondary | ICD-10-CM | POA: Diagnosis not present

## 2020-10-30 DIAGNOSIS — M5416 Radiculopathy, lumbar region: Secondary | ICD-10-CM | POA: Diagnosis not present

## 2020-10-30 DIAGNOSIS — G8929 Other chronic pain: Secondary | ICD-10-CM | POA: Diagnosis not present

## 2020-10-30 DIAGNOSIS — M47816 Spondylosis without myelopathy or radiculopathy, lumbar region: Secondary | ICD-10-CM | POA: Diagnosis not present

## 2020-10-30 DIAGNOSIS — M25511 Pain in right shoulder: Secondary | ICD-10-CM | POA: Diagnosis not present

## 2020-10-30 DIAGNOSIS — M5136 Other intervertebral disc degeneration, lumbar region: Secondary | ICD-10-CM | POA: Diagnosis not present

## 2020-11-01 DIAGNOSIS — R739 Hyperglycemia, unspecified: Secondary | ICD-10-CM | POA: Diagnosis not present

## 2020-11-01 DIAGNOSIS — E7800 Pure hypercholesterolemia, unspecified: Secondary | ICD-10-CM | POA: Diagnosis not present

## 2020-11-01 DIAGNOSIS — Z79899 Other long term (current) drug therapy: Secondary | ICD-10-CM | POA: Diagnosis not present

## 2020-11-02 ENCOUNTER — Telehealth: Payer: Self-pay

## 2020-11-02 NOTE — Telephone Encounter (Signed)
Per Dr Sherron Monday, patient would like to resume monthly maintenance PTNS. No PA required with [employer]. Patient scheduled for 11/13/20 @ 2:15. LMOM asking patient to return call to confirm appt.

## 2020-11-08 DIAGNOSIS — R739 Hyperglycemia, unspecified: Secondary | ICD-10-CM | POA: Diagnosis not present

## 2020-11-08 DIAGNOSIS — R413 Other amnesia: Secondary | ICD-10-CM | POA: Diagnosis not present

## 2020-11-08 DIAGNOSIS — E7800 Pure hypercholesterolemia, unspecified: Secondary | ICD-10-CM | POA: Diagnosis not present

## 2020-11-08 DIAGNOSIS — G4733 Obstructive sleep apnea (adult) (pediatric): Secondary | ICD-10-CM | POA: Diagnosis not present

## 2020-11-08 DIAGNOSIS — Z0000 Encounter for general adult medical examination without abnormal findings: Secondary | ICD-10-CM | POA: Diagnosis not present

## 2020-11-08 DIAGNOSIS — Z1211 Encounter for screening for malignant neoplasm of colon: Secondary | ICD-10-CM | POA: Diagnosis not present

## 2020-11-08 DIAGNOSIS — Z79899 Other long term (current) drug therapy: Secondary | ICD-10-CM | POA: Diagnosis not present

## 2020-11-13 ENCOUNTER — Ambulatory Visit: Payer: PPO

## 2020-11-16 DIAGNOSIS — Z1211 Encounter for screening for malignant neoplasm of colon: Secondary | ICD-10-CM | POA: Diagnosis not present

## 2020-11-20 ENCOUNTER — Other Ambulatory Visit: Payer: Self-pay

## 2020-11-20 ENCOUNTER — Ambulatory Visit (INDEPENDENT_AMBULATORY_CARE_PROVIDER_SITE_OTHER): Payer: PPO | Admitting: Family Medicine

## 2020-11-20 DIAGNOSIS — N3946 Mixed incontinence: Secondary | ICD-10-CM | POA: Diagnosis not present

## 2020-11-20 NOTE — Progress Notes (Signed)
PTNS  Session # Monthly (19 of 45)  Health & Social Factors: no change Caffeine: 1 Alcohol: 0 Daytime voids #per day: 8 Night-time voids #per night: 0 Urgency: mild Incontinence Episodes #per day: 0 Ankle used: right Treatment Setting: 6 Feeling/ Response: sensory Comments: Patient tolerated well  Performed By: Lyla Son Jhane Lorio, CMA  Follow Up: 1 month # 20 9f 45

## 2020-12-18 ENCOUNTER — Telehealth: Payer: Self-pay

## 2020-12-18 ENCOUNTER — Other Ambulatory Visit: Payer: Self-pay

## 2020-12-18 ENCOUNTER — Ambulatory Visit (INDEPENDENT_AMBULATORY_CARE_PROVIDER_SITE_OTHER): Payer: PPO | Admitting: Family Medicine

## 2020-12-18 DIAGNOSIS — N3946 Mixed incontinence: Secondary | ICD-10-CM

## 2020-12-18 MED ORDER — GEMTESA 75 MG PO TABS: 75.0000 mg | ORAL_TABLET | Freq: Every day | ORAL | 11 refills | Status: DC

## 2020-12-18 NOTE — Telephone Encounter (Signed)
Pt was in clinic for PTNS. Asking for refill on gemtesa. Medication was sent to pharmacy.

## 2020-12-18 NOTE — Progress Notes (Signed)
PTNS  Session # Monthly 20 of 45  Health & Social Factors: no change Caffeine: 1 Alcohol: 0 Daytime voids #per day: 4 Night-time voids #per night: 0 Urgency: strong Incontinence Episodes #per day: 1 Ankle used: right Treatment Setting: 10 Feeling/ Response: sensory Comments: patient tolerated well  Performed By: Teressa Lower, CMA  Follow Up: 1 month 21 of 45

## 2021-01-15 ENCOUNTER — Ambulatory Visit: Payer: PPO

## 2021-01-31 DIAGNOSIS — Z79899 Other long term (current) drug therapy: Secondary | ICD-10-CM | POA: Diagnosis not present

## 2021-01-31 DIAGNOSIS — M5416 Radiculopathy, lumbar region: Secondary | ICD-10-CM | POA: Diagnosis not present

## 2021-01-31 DIAGNOSIS — M48062 Spinal stenosis, lumbar region with neurogenic claudication: Secondary | ICD-10-CM | POA: Diagnosis not present

## 2021-01-31 DIAGNOSIS — M5136 Other intervertebral disc degeneration, lumbar region: Secondary | ICD-10-CM | POA: Diagnosis not present

## 2021-01-31 DIAGNOSIS — M47816 Spondylosis without myelopathy or radiculopathy, lumbar region: Secondary | ICD-10-CM | POA: Diagnosis not present

## 2021-02-06 DIAGNOSIS — H401132 Primary open-angle glaucoma, bilateral, moderate stage: Secondary | ICD-10-CM | POA: Diagnosis not present

## 2021-02-06 DIAGNOSIS — H353132 Nonexudative age-related macular degeneration, bilateral, intermediate dry stage: Secondary | ICD-10-CM | POA: Diagnosis not present

## 2021-02-12 ENCOUNTER — Encounter: Payer: Self-pay | Admitting: Urology

## 2021-02-12 ENCOUNTER — Ambulatory Visit: Payer: PPO | Admitting: Urology

## 2021-02-26 DIAGNOSIS — H903 Sensorineural hearing loss, bilateral: Secondary | ICD-10-CM | POA: Diagnosis not present

## 2021-02-26 DIAGNOSIS — H6123 Impacted cerumen, bilateral: Secondary | ICD-10-CM | POA: Diagnosis not present

## 2021-02-26 DIAGNOSIS — H7401 Tympanosclerosis, right ear: Secondary | ICD-10-CM | POA: Diagnosis not present

## 2021-02-27 DIAGNOSIS — Z79899 Other long term (current) drug therapy: Secondary | ICD-10-CM | POA: Diagnosis not present

## 2021-02-27 DIAGNOSIS — R739 Hyperglycemia, unspecified: Secondary | ICD-10-CM | POA: Diagnosis not present

## 2021-02-27 DIAGNOSIS — E7800 Pure hypercholesterolemia, unspecified: Secondary | ICD-10-CM | POA: Diagnosis not present

## 2021-03-05 ENCOUNTER — Ambulatory Visit: Payer: Self-pay | Admitting: Urology

## 2021-03-12 ENCOUNTER — Ambulatory Visit: Payer: Self-pay | Admitting: Urology

## 2021-04-05 ENCOUNTER — Other Ambulatory Visit: Payer: Self-pay | Admitting: Internal Medicine

## 2021-04-05 DIAGNOSIS — Z79899 Other long term (current) drug therapy: Secondary | ICD-10-CM | POA: Diagnosis not present

## 2021-04-05 DIAGNOSIS — R55 Syncope and collapse: Secondary | ICD-10-CM

## 2021-04-05 DIAGNOSIS — N1832 Chronic kidney disease, stage 3b: Secondary | ICD-10-CM | POA: Diagnosis not present

## 2021-04-16 DIAGNOSIS — M48062 Spinal stenosis, lumbar region with neurogenic claudication: Secondary | ICD-10-CM | POA: Diagnosis not present

## 2021-04-16 DIAGNOSIS — Z79899 Other long term (current) drug therapy: Secondary | ICD-10-CM | POA: Diagnosis not present

## 2021-04-16 DIAGNOSIS — M5136 Other intervertebral disc degeneration, lumbar region: Secondary | ICD-10-CM | POA: Diagnosis not present

## 2021-04-16 DIAGNOSIS — M25511 Pain in right shoulder: Secondary | ICD-10-CM | POA: Diagnosis not present

## 2021-04-16 DIAGNOSIS — M5416 Radiculopathy, lumbar region: Secondary | ICD-10-CM | POA: Diagnosis not present

## 2021-04-16 DIAGNOSIS — M545 Low back pain, unspecified: Secondary | ICD-10-CM | POA: Diagnosis not present

## 2021-04-16 DIAGNOSIS — G8929 Other chronic pain: Secondary | ICD-10-CM | POA: Diagnosis not present

## 2021-04-16 DIAGNOSIS — M4316 Spondylolisthesis, lumbar region: Secondary | ICD-10-CM | POA: Diagnosis not present

## 2021-04-17 ENCOUNTER — Ambulatory Visit
Admission: RE | Admit: 2021-04-17 | Discharge: 2021-04-17 | Disposition: A | Discharge status: Home / Self Care | Payer: PPO | Source: Ambulatory Visit | Attending: Internal Medicine | Admitting: Internal Medicine

## 2021-04-17 DIAGNOSIS — G319 Degenerative disease of nervous system, unspecified: Secondary | ICD-10-CM | POA: Diagnosis not present

## 2021-04-17 DIAGNOSIS — R55 Syncope and collapse: Secondary | ICD-10-CM

## 2021-04-23 ENCOUNTER — Other Ambulatory Visit: Payer: Self-pay

## 2021-04-23 ENCOUNTER — Ambulatory Visit: Payer: PPO | Admitting: Urology

## 2021-04-23 ENCOUNTER — Encounter: Payer: Self-pay | Admitting: Urology

## 2021-04-23 VITALS — BP 148/88 | HR 86 | Ht 61.0 in | Wt 160.0 lb

## 2021-04-23 DIAGNOSIS — N3946 Mixed incontinence: Secondary | ICD-10-CM

## 2021-04-23 LAB — URINALYSIS, COMPLETE
Bilirubin, UA: NEGATIVE
Glucose, UA: NEGATIVE
Ketones, UA: NEGATIVE
Leukocytes,UA: NEGATIVE
Nitrite, UA: NEGATIVE
Protein,UA: NEGATIVE
RBC, UA: NEGATIVE
Specific Gravity, UA: 1.025 (ref 1.005–1.030)
Urobilinogen, Ur: 0.2 mg/dL (ref 0.2–1.0)
pH, UA: 5.5 (ref 5.0–7.5)

## 2021-04-23 LAB — MICROSCOPIC EXAMINATION: Bacteria, UA: NONE SEEN

## 2021-04-23 MED ORDER — TOLTERODINE TARTRATE ER 4 MG PO CP24: 4.0000 mg | ORAL_CAPSULE | Freq: Every day | ORAL | 3 refills | Status: DC

## 2021-04-23 NOTE — Progress Notes (Signed)
04/23/2021 1:35 PM   Stacey Warner November 21, 1932 [id number]  Referring provider: Marguarite Arbour, MD [address] [facility] [place],  [place] [address]  Chief Complaint  Patient presents with   Urinary Incontinence    HPI: I was consulted to assess the patient's worsening urinary incontinence over the last 12 months.  She is a partial responder to Myrbetriq 25 mg.  She describes another pill years ago that caused dry mouth.   She does not leak with coughing sneezing but has urge incontinence.  She wears 5 or 6 pads a day moderately wet.  She gets up sometimes once at night.  She voids every 1-2 hours during the day.  She does not have bedwetting   50% better on Myrbetriq.  Patient would like to try Detrol LA 4 mg in combination of the Myrbetriq and reassess in 6 weeks.  I can always stop the Myrbetriq.  She is willing to set her goals high and try to get drier.  Percutaneous tibial nerve stimulation a good option moving forward    Patient actually stopped the Myrbetriq due to dry mouth which would be less common.  She states she is happy just on Detrol.  It helps moderately so.   I will see in 1 year on Detrol 90x3   Patient has ongoing urge incontinence perhaps little bit worse or more frequent.  Still on Detrol.   Patient has worsening frequency and urge incontinence.  Send urine for culture.  Discussed percutaneous tibial nerve stimulation.      Patient doing percutaneous tibial nerve stimulation. It looks like she is already had 10 or 11 treatments but stopped it for about 2 weeks because she thought it may be causing back pain.  She has a nonspecific mid low back pain.  We talked about this were not can to get an ultrasound.     We will restart the treatments of percutaneous tibial nerve stimulation it is important she gets one this week so the clinical benefit reducing urge incontinence does not wear off.  We have to get the paperwork organized.  Urge  incontinence improved.  Based upon looking at her medication list no longer on tolterodine    she thinks he is leaking more.  The visit is similar to the last 1 where she is not sure she still doing the ankle treatments.  Renew Detrol.  Continue with percutaneous tibial nerve stimulation.  Call if urine culture is sent and positive.  Given new beta 3 agonists and if it works prescribe it and added to the [employer].  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  Today Frequency stable.  Last culture negative. History is always challenging.  I think she still leaking on Detrol but overall stable.  I think the PTNS helped for a while but then she stopped it since October.  We both decided not to try to continue with it.  Clinically not infected       PMH: No past medical history on file.  Surgical History: Past Surgical History:  Procedure Laterality Date   BREAST BIOPSY Right    CORE W/CLIP X 2 - NEG    Home Medications:  Allergies as of 04/23/2021       Reactions   Amoxicillin-pot Clavulanate Rash   Ciprofloxacin    Other reaction(s): Unknown Pt doesn't remember   Clindamycin    Other reaction(s): Unknown   Minocycline    Other  reaction(s): Unknown Pt states made her feel "funny"   Sucralfate    Other reaction(s): Other (See Comments) "can't take"   Sulfa Antibiotics Rash   Had a small area of rash about 30 years ago when she took sulfa, has not taken since.   Tape Rash        Medication List        Accurate as of April 23, 2021  1:35 PM. If you have any questions, ask your nurse or doctor.          aspirin EC 81 MG tablet Take 81 mg by mouth daily.   atorvastatin 10 MG tablet Commonly known as: LIPITOR Take 10 mg by mouth daily.   azelastine 0.1 % nasal spray Commonly known as: ASTELIN   Baclofen 5 MG Tabs TAKE 1 TABLET (5 MG TOTAL) BY MOUTH NIGHTLY AS NEEDED FOR UP TO 30 DAYS   CALCIUM + D PO Take by mouth.   Calcium  Carbonate-Vitamin D 600-400 MG-UNIT tablet Take by mouth.   cyclobenzaprine 5 MG tablet Commonly known as: FLEXERIL TAKE 1 TABLET BY MOUTH EVERY DAY AT NIGHT   donepezil 5 MG tablet Commonly known as: ARICEPT TAKE 1 TABLET BY MOUTH EVERY DAY AT NIGHT   dorzolamide-timolol 22.3-6.8 MG/ML ophthalmic solution Commonly known as: COSOPT   famotidine 20 MG tablet Commonly known as: PEPCID Take by mouth.   fluticasone 50 MCG/ACT nasal spray Commonly known as: FLONASE   Gemtesa 75 MG Tabs Generic drug: Vibegron Take 75 mg by mouth daily.   latanoprost 0.005 % ophthalmic solution Commonly known as: XALATAN   levocetirizine 5 MG tablet Commonly known as: XYZAL SMARTSIG:1 Tablet(s) By Mouth Every Evening   magnesium chloride 64 MG Tbec SR tablet Commonly known as: SLOW-MAG Take by mouth.   memantine 5 MG tablet Commonly known as: NAMENDA Take 5 mg by mouth 2 (two) times daily.   MIRALAX PO Take by mouth.   montelukast 10 MG tablet Commonly known as: SINGULAIR TAKE ONE TABLET DAILY EACH EVENING PRIOR TO BEDTIME   pantoprazole 40 MG tablet Commonly known as: PROTONIX TAKE 1 TABLET BY MOUTH TWICE A DAY   ranitidine 300 MG capsule Commonly known as: ZANTAC Take by mouth.   sertraline 50 MG tablet Commonly known as: ZOLOFT Take 50 mg by mouth daily.   tolterodine 4 MG 24 hr capsule Commonly known as: Detrol LA Take 1 capsule (4 mg total) by mouth daily.   triamcinolone cream 0.1 % Commonly known as: KENALOG Apply topically.        Allergies:  Allergies  Allergen Reactions   Amoxicillin-Pot Clavulanate Rash   Ciprofloxacin     Other reaction(s): Unknown Pt doesn't remember   Clindamycin     Other reaction(s): Unknown   Minocycline     Other reaction(s): Unknown Pt states made her feel "funny"   Sucralfate     Other reaction(s): Other (See Comments) "can't take"   Sulfa Antibiotics Rash    Had a small area of rash about 30 years ago when she took  sulfa, has not taken since.   Tape Rash    Family History: Family History  Problem Relation Age of Onset   Breast cancer Neg Hx     Social History:  reports that she has never smoked. She has never used smokeless tobacco. She reports that she does not drink alcohol and does not use drugs.  ROS:  Physical Exam: There were no vitals taken for this visit.  Constitutional:  Alert and oriented, No acute distress. HEENT: [place] AT, moist mucus membranes.  Trachea midline, no masses.  Laboratory Data: No results found for: WBC, HGB, HCT, MCV, PLT  No results found for: CREATININE  No results found for: PSA  No results found for: TESTOSTERONE  No results found for: HGBA1C  Urinalysis    Component Value Date/Time   APPEARANCEUR Clear 10/23/2020 1017   GLUCOSEU Negative 10/23/2020 1017   BILIRUBINUR Negative 10/23/2020 1017   PROTEINUR Negative 10/23/2020 1017   NITRITE Negative 10/23/2020 1017   LEUKOCYTESUR 1+ (A) 10/23/2020 1017    Pertinent Imaging: Detrol 90x3 sent to pharmacy and I will see in 1 year  Assessment & Plan: As noted above  1. Mixed incontinence   - Urinalysis, Complete   No follow-ups on file.  Martina SinnerScott A Tzipporah Nagorski, MD  [facility] [address], [address] [place], [place] [id number] [phone]

## 2021-04-27 DIAGNOSIS — M5416 Radiculopathy, lumbar region: Secondary | ICD-10-CM | POA: Diagnosis not present

## 2021-04-27 DIAGNOSIS — M48062 Spinal stenosis, lumbar region with neurogenic claudication: Secondary | ICD-10-CM | POA: Diagnosis not present

## 2021-05-02 DIAGNOSIS — R413 Other amnesia: Secondary | ICD-10-CM | POA: Diagnosis not present

## 2021-05-02 DIAGNOSIS — Z0000 Encounter for general adult medical examination without abnormal findings: Secondary | ICD-10-CM | POA: Diagnosis not present

## 2021-05-02 DIAGNOSIS — G4733 Obstructive sleep apnea (adult) (pediatric): Secondary | ICD-10-CM | POA: Diagnosis not present

## 2021-05-02 DIAGNOSIS — R55 Syncope and collapse: Secondary | ICD-10-CM | POA: Diagnosis not present

## 2021-05-02 DIAGNOSIS — E782 Mixed hyperlipidemia: Secondary | ICD-10-CM | POA: Diagnosis not present

## 2021-05-02 DIAGNOSIS — R739 Hyperglycemia, unspecified: Secondary | ICD-10-CM | POA: Diagnosis not present

## 2021-05-02 DIAGNOSIS — I6523 Occlusion and stenosis of bilateral carotid arteries: Secondary | ICD-10-CM | POA: Diagnosis not present

## 2021-05-10 DIAGNOSIS — R2689 Other abnormalities of gait and mobility: Secondary | ICD-10-CM | POA: Diagnosis not present

## 2021-05-10 DIAGNOSIS — F0280 Dementia in other diseases classified elsewhere without behavioral disturbance: Secondary | ICD-10-CM | POA: Diagnosis not present

## 2021-05-10 DIAGNOSIS — G2581 Restless legs syndrome: Secondary | ICD-10-CM | POA: Diagnosis not present

## 2021-05-10 DIAGNOSIS — R55 Syncope and collapse: Secondary | ICD-10-CM | POA: Diagnosis not present

## 2021-05-10 DIAGNOSIS — G309 Alzheimer's disease, unspecified: Secondary | ICD-10-CM | POA: Diagnosis not present

## 2021-05-18 DIAGNOSIS — M5136 Other intervertebral disc degeneration, lumbar region: Secondary | ICD-10-CM | POA: Diagnosis not present

## 2021-05-18 DIAGNOSIS — Z79899 Other long term (current) drug therapy: Secondary | ICD-10-CM | POA: Diagnosis not present

## 2021-05-18 DIAGNOSIS — M5416 Radiculopathy, lumbar region: Secondary | ICD-10-CM | POA: Diagnosis not present

## 2021-05-18 DIAGNOSIS — M48062 Spinal stenosis, lumbar region with neurogenic claudication: Secondary | ICD-10-CM | POA: Diagnosis not present

## 2021-05-18 DIAGNOSIS — M25511 Pain in right shoulder: Secondary | ICD-10-CM | POA: Diagnosis not present

## 2021-05-18 DIAGNOSIS — G8929 Other chronic pain: Secondary | ICD-10-CM | POA: Diagnosis not present

## 2021-05-18 DIAGNOSIS — M62830 Muscle spasm of back: Secondary | ICD-10-CM | POA: Diagnosis not present

## 2021-05-24 DIAGNOSIS — Z8701 Personal history of pneumonia (recurrent): Secondary | ICD-10-CM | POA: Diagnosis not present

## 2021-05-24 DIAGNOSIS — Z7982 Long term (current) use of aspirin: Secondary | ICD-10-CM | POA: Diagnosis not present

## 2021-05-24 DIAGNOSIS — G2581 Restless legs syndrome: Secondary | ICD-10-CM | POA: Diagnosis not present

## 2021-05-24 DIAGNOSIS — K5790 Diverticulosis of intestine, part unspecified, without perforation or abscess without bleeding: Secondary | ICD-10-CM | POA: Diagnosis not present

## 2021-05-24 DIAGNOSIS — R519 Headache, unspecified: Secondary | ICD-10-CM | POA: Diagnosis not present

## 2021-05-24 DIAGNOSIS — Z79899 Other long term (current) drug therapy: Secondary | ICD-10-CM | POA: Diagnosis not present

## 2021-05-24 DIAGNOSIS — N6039 Fibrosclerosis of unspecified breast: Secondary | ICD-10-CM | POA: Diagnosis not present

## 2021-05-24 DIAGNOSIS — F0280 Dementia in other diseases classified elsewhere without behavioral disturbance: Secondary | ICD-10-CM | POA: Diagnosis not present

## 2021-05-24 DIAGNOSIS — G4733 Obstructive sleep apnea (adult) (pediatric): Secondary | ICD-10-CM | POA: Diagnosis not present

## 2021-05-24 DIAGNOSIS — K219 Gastro-esophageal reflux disease without esophagitis: Secondary | ICD-10-CM | POA: Diagnosis not present

## 2021-05-24 DIAGNOSIS — N3281 Overactive bladder: Secondary | ICD-10-CM | POA: Diagnosis not present

## 2021-05-24 DIAGNOSIS — Z9181 History of falling: Secondary | ICD-10-CM | POA: Diagnosis not present

## 2021-05-24 DIAGNOSIS — E785 Hyperlipidemia, unspecified: Secondary | ICD-10-CM | POA: Diagnosis not present

## 2021-05-24 DIAGNOSIS — H9193 Unspecified hearing loss, bilateral: Secondary | ICD-10-CM | POA: Diagnosis not present

## 2021-05-24 DIAGNOSIS — G301 Alzheimer's disease with late onset: Secondary | ICD-10-CM | POA: Diagnosis not present

## 2021-05-24 DIAGNOSIS — R3915 Urgency of urination: Secondary | ICD-10-CM | POA: Diagnosis not present

## 2021-05-24 DIAGNOSIS — H409 Unspecified glaucoma: Secondary | ICD-10-CM | POA: Diagnosis not present

## 2021-06-12 DIAGNOSIS — M47816 Spondylosis without myelopathy or radiculopathy, lumbar region: Secondary | ICD-10-CM | POA: Diagnosis not present

## 2021-07-10 DIAGNOSIS — M5136 Other intervertebral disc degeneration, lumbar region: Secondary | ICD-10-CM | POA: Diagnosis not present

## 2021-07-10 DIAGNOSIS — G309 Alzheimer's disease, unspecified: Secondary | ICD-10-CM | POA: Diagnosis not present

## 2021-07-10 DIAGNOSIS — M5416 Radiculopathy, lumbar region: Secondary | ICD-10-CM | POA: Diagnosis not present

## 2021-07-10 DIAGNOSIS — G2581 Restless legs syndrome: Secondary | ICD-10-CM | POA: Diagnosis not present

## 2021-07-10 DIAGNOSIS — M48062 Spinal stenosis, lumbar region with neurogenic claudication: Secondary | ICD-10-CM | POA: Diagnosis not present

## 2021-07-10 DIAGNOSIS — R55 Syncope and collapse: Secondary | ICD-10-CM | POA: Diagnosis not present

## 2021-07-10 DIAGNOSIS — F0280 Dementia in other diseases classified elsewhere without behavioral disturbance: Secondary | ICD-10-CM | POA: Diagnosis not present

## 2021-07-10 DIAGNOSIS — Z7189 Other specified counseling: Secondary | ICD-10-CM | POA: Diagnosis not present

## 2021-07-19 DIAGNOSIS — M5136 Other intervertebral disc degeneration, lumbar region: Secondary | ICD-10-CM | POA: Diagnosis not present

## 2021-07-19 DIAGNOSIS — M5416 Radiculopathy, lumbar region: Secondary | ICD-10-CM | POA: Diagnosis not present

## 2021-08-03 DIAGNOSIS — E782 Mixed hyperlipidemia: Secondary | ICD-10-CM | POA: Diagnosis not present

## 2021-08-03 DIAGNOSIS — R413 Other amnesia: Secondary | ICD-10-CM | POA: Diagnosis not present

## 2021-08-03 DIAGNOSIS — G4733 Obstructive sleep apnea (adult) (pediatric): Secondary | ICD-10-CM | POA: Diagnosis not present

## 2021-08-03 DIAGNOSIS — Z79899 Other long term (current) drug therapy: Secondary | ICD-10-CM | POA: Diagnosis not present

## 2021-08-03 DIAGNOSIS — R739 Hyperglycemia, unspecified: Secondary | ICD-10-CM | POA: Diagnosis not present

## 2021-08-21 DIAGNOSIS — H353132 Nonexudative age-related macular degeneration, bilateral, intermediate dry stage: Secondary | ICD-10-CM | POA: Diagnosis not present

## 2021-08-21 DIAGNOSIS — H401122 Primary open-angle glaucoma, left eye, moderate stage: Secondary | ICD-10-CM | POA: Diagnosis not present

## 2021-08-21 DIAGNOSIS — H401111 Primary open-angle glaucoma, right eye, mild stage: Secondary | ICD-10-CM | POA: Diagnosis not present

## 2021-09-28 DIAGNOSIS — F0280 Dementia in other diseases classified elsewhere without behavioral disturbance: Secondary | ICD-10-CM | POA: Diagnosis not present

## 2021-09-28 DIAGNOSIS — G309 Alzheimer's disease, unspecified: Secondary | ICD-10-CM | POA: Diagnosis not present

## 2021-09-28 DIAGNOSIS — R410 Disorientation, unspecified: Secondary | ICD-10-CM | POA: Diagnosis not present

## 2021-10-08 DIAGNOSIS — F0280 Dementia in other diseases classified elsewhere without behavioral disturbance: Secondary | ICD-10-CM | POA: Diagnosis not present

## 2021-10-08 DIAGNOSIS — G2581 Restless legs syndrome: Secondary | ICD-10-CM | POA: Diagnosis not present

## 2021-10-08 DIAGNOSIS — R2689 Other abnormalities of gait and mobility: Secondary | ICD-10-CM | POA: Diagnosis not present

## 2021-10-08 DIAGNOSIS — G4733 Obstructive sleep apnea (adult) (pediatric): Secondary | ICD-10-CM | POA: Diagnosis not present

## 2021-10-08 DIAGNOSIS — F5101 Primary insomnia: Secondary | ICD-10-CM | POA: Diagnosis not present

## 2021-10-08 DIAGNOSIS — Z7189 Other specified counseling: Secondary | ICD-10-CM | POA: Diagnosis not present

## 2021-10-08 DIAGNOSIS — G309 Alzheimer's disease, unspecified: Secondary | ICD-10-CM | POA: Diagnosis not present

## 2021-10-08 DIAGNOSIS — H9193 Unspecified hearing loss, bilateral: Secondary | ICD-10-CM | POA: Diagnosis not present

## 2021-10-11 ENCOUNTER — Ambulatory Visit
Payer: PPO | Attending: Student in an Organized Health Care Education/Training Program | Admitting: Student in an Organized Health Care Education/Training Program

## 2021-10-11 ENCOUNTER — Encounter: Payer: Self-pay | Admitting: Student in an Organized Health Care Education/Training Program

## 2021-10-11 VITALS — BP 115/64 | HR 63 | Temp 97.6°F | Resp 16 | Ht 61.0 in | Wt 159.6 lb

## 2021-10-11 DIAGNOSIS — M545 Low back pain, unspecified: Secondary | ICD-10-CM | POA: Diagnosis not present

## 2021-10-11 DIAGNOSIS — M51369 Other intervertebral disc degeneration, lumbar region without mention of lumbar back pain or lower extremity pain: Secondary | ICD-10-CM | POA: Insufficient documentation

## 2021-10-11 DIAGNOSIS — G8929 Other chronic pain: Secondary | ICD-10-CM | POA: Diagnosis not present

## 2021-10-11 DIAGNOSIS — M47816 Spondylosis without myelopathy or radiculopathy, lumbar region: Secondary | ICD-10-CM | POA: Diagnosis not present

## 2021-10-11 DIAGNOSIS — M5136 Other intervertebral disc degeneration, lumbar region: Secondary | ICD-10-CM | POA: Insufficient documentation

## 2021-10-11 DIAGNOSIS — G894 Chronic pain syndrome: Secondary | ICD-10-CM | POA: Diagnosis not present

## 2021-10-11 NOTE — Progress Notes (Signed)
Safety precautions to be maintained throughout the outpatient stay will include: orient to surroundings, keep bed in low position, maintain call bell within reach at all times, provide assistance with transfer out of bed and ambulation.  

## 2021-10-11 NOTE — Progress Notes (Signed)
Patient: Stacey Warner  Service Category: E/M  Provider: Gillis Santa, MD  DOB: 1932/08/07  DOS: 10/11/2021  Referring Provider: Harvest Dark, FNP  MRN: [id number]  Setting: Ambulatory outpatient  PCP: Stacey Crouch, MD  Type: New Patient  Specialty: Interventional Pain Management    Location: Office  Delivery: Face-to-face     Primary Reason(s) for Visit: Encounter for initial evaluation of one or more chronic problems (new to examiner) potentially causing chronic pain, and posing a threat to normal musculoskeletal function. (Level of risk: High) CC: Back Pain (lower)  HPI  Stacey Warner is a 88 y.o. year old, female patient, who comes for the first time to our practice referred by Meeler, Sherren Kerns, FNP for our initial evaluation of her chronic pain. She has Lumbar spondylosis; Lumbar degenerative disc disease; Chronic bilateral low back pain without sciatica; and Chronic pain syndrome on their problem list. Today she comes in for evaluation of her Back Pain (lower)  Pain Assessment: Location: Lower Back Radiating: denies Onset: More than a month ago Duration: Chronic pain Quality: Aching, Dull Severity: 4 /10 (subjective, self-reported pain score)  Effect on ADL: limits daily activities, painfull to walk Timing: Constant Modifying factors: vicodin, ibuprofen BP: 115/64  HR: 63  Onset and Duration: Sudden Cause of pain: Unknown Severity: Getting worse, NAS-11 at its worse: 6/10, NAS-11 at its best: 2/10, NAS-11 now: 3/10, and NAS-11 on the average: 3/10 Timing: Not influenced by the time of the day Aggravating Factors: Walking Alleviating Factors:  nothing Associated Problems: Constipation, Inability to control bladder (urine), Inability to control bowel, and Pain that wakes patient up Quality of Pain: Aching and Nagging Previous Examinations or Tests: X-rays Previous Treatments: detailed below  Stacey Warner is a pleasant 88 year old female who is accompanied today by her Sister  Stacey Warner with a chief complaint of axial low back pain related to severe lumbar degenerative disc disease, severe lumbar facet arthropathy and associated spondylosis.  She is being referred from Stacey Warner for consideration of lumbar peripheral nerve stimulation.  See HPI from referral note below PM&R 07/10/21: The patient is a pleasant 88 year old female who presents for follow-up of acute on chronic mid low back pain without radiation to the buttock/tailbone without radiating pain to the lower extremities. Her pain began in July 2021 however she has experienced back pain in the past. She rates her pain as mild to moderate that is stiff, aching and intermittent worsened with walking and with occasional rotation. Rest can help to alleviate her pain. Medications include Tylenol extra strength (mild to moderate relief).  She denies changes to her bowels or bladder or saddle anesthesia. Patient states that she has had urinary urgency and urge incontinence for years that has worsened over the last year for which she is receiving treatment by urology.  She is also followed for right right shoulder pain. She states that her right shoulder pain began years ago and is worsened with overhead lifting and when reaching to clasp her bra.  She was last evaluated on 05/18/2021 at which time she was experiencing some continued shoulder pain for which she was referred to physical therapy and she was to move forward with radiofrequency ablation to target her low back pain. She was to continue with Tylenol Exer strength and occasional use of Norco 5/325 1 tablet twice daily as needed #60. She was to follow-up after radiofrequency.  At today's visit she states she has had return of pain in her low back and  is having continued leg cramps. Her sister is concerned about have any medications that she is taking. Today we discussed the possibility of a spinal cord stimulator and they would like to meet with the pain clinic to discuss  this option. They request an order for rolling walker for assistance with ambulation. She continues with hydrocodone as needed which provides moderate benefit.  She resides with her sister. She continues to drive.  The Stacey Warner was reviewed today.  The patient has signed a Stacey Warner (10/30/2020). We discussed the risk and benefits associated with narcotics. uds obtained on 10/30/2020 (appropriate)  Procedures:  04/27/2021: Bilateral L4-5 transforaminal ESI (2 days relief then return of pain) 04/16/2021: Right subacromial injection (1 month of relief) 10/02/2020: MBB to the bilateral L4-5 and L5-S1 facet joints (6/10 to 0/10) 09/14/2020: MBB to the bilateral L4-5 and L5-S1 facet joints (7/10 to 0/10) 08/15/2020: Right L5-S1 and Left S1 transforaminal ESI (left L5-S1 transforaminal approach was aborted due to repeated intravascular uptake, no relief)  Meds   Current Outpatient Medications:    atorvastatin (LIPITOR) 10 MG tablet, Take 10 mg by mouth daily., Disp: , Rfl: 3   doxepin (SINEQUAN) 25 MG capsule, Take 25 mg by mouth at bedtime., Disp: , Rfl:    famotidine (PEPCID) 20 MG tablet, Take by mouth., Disp: , Rfl:    ibuprofen (ADVIL) 800 MG tablet, Take 800 mg by mouth every 8 (eight) hours as needed., Disp: , Rfl:    levocetirizine (XYZAL) 5 MG tablet, SMARTSIG:1 Tablet(s) By Mouth Every Evening, Disp: , Rfl:    MAGNESIUM-CALCIUM-FOLIC ACID PO, Take 1 tablet by mouth daily., Disp: , Rfl:    montelukast (SINGULAIR) 10 MG tablet, TAKE ONE TABLET DAILY EACH EVENING PRIOR TO BEDTIME, Disp: , Rfl: 10   pilocarpine (SALAGEN) 5 MG tablet, Take 5 mg by mouth 2 (two) times daily., Disp: , Rfl:    rOPINIRole (REQUIP) 1 MG tablet, Take 1 mg by mouth at bedtime., Disp: , Rfl:    tolterodine (DETROL LA) 4 MG 24 hr capsule, Take 1 capsule (4 mg total) by mouth daily., Disp: 90 capsule, Rfl: 3   aspirin EC 81 MG tablet, Take 81 mg by mouth daily., Disp: ,  Rfl:    azelastine (ASTELIN) 0.1 % nasal spray, , Disp: , Rfl:    Baclofen 5 MG TABS, TAKE 1 TABLET (5 MG TOTAL) BY MOUTH NIGHTLY AS NEEDED FOR UP TO 30 DAYS, Disp: , Rfl:    Calcium Carbonate-Vitamin D 600-400 MG-UNIT tablet, Take by mouth., Disp: , Rfl:    Calcium Citrate-Vitamin D (CALCIUM + D PO), Take by mouth., Disp: , Rfl:    cyclobenzaprine (FLEXERIL) 5 MG tablet, TAKE 1 TABLET BY MOUTH EVERY DAY AT NIGHT, Disp: , Rfl: 3   donepezil (ARICEPT) 5 MG tablet, TAKE 1 TABLET BY MOUTH EVERY DAY AT NIGHT, Disp: , Rfl:    dorzolamide-timolol (COSOPT) 22.3-6.8 MG/ML ophthalmic solution, , Disp: , Rfl:    fluticasone (FLONASE) 50 MCG/ACT nasal spray, , Disp: , Rfl:    latanoprost (XALATAN) 0.005 % ophthalmic solution, , Disp: , Rfl:    magnesium chloride (SLOW-MAG) 64 MG TBEC SR tablet, Take by mouth., Disp: , Rfl:    memantine (NAMENDA) 5 MG tablet, Take 5 mg by mouth 2 (two) times daily., Disp: , Rfl:    pantoprazole (PROTONIX) 40 MG tablet, TAKE 1 TABLET BY MOUTH TWICE A DAY, Disp: , Rfl:    Polyethylene Glycol 3350 (MIRALAX PO), Take  by mouth., Disp: , Rfl:    ranitidine (ZANTAC) 300 MG capsule, Take by mouth., Disp: , Rfl:    sertraline (ZOLOFT) 50 MG tablet, Take 50 mg by mouth daily. (Patient not taking: Reported on 10/11/2021), Disp: , Rfl:    triamcinolone cream (KENALOG) 0.1 %, Apply topically., Disp: , Rfl:   Imaging Review    Lumbosacral Imaging: Lumbar MR wo contrast: Results for orders placed during the hospital encounter of 08/01/20  MR LUMBAR SPINE WO CONTRAST  Narrative CLINICAL DATA:  Worsening low back pain over the last 5 years.  EXAM: MRI LUMBAR SPINE WITHOUT CONTRAST  TECHNIQUE: Multiplanar, multisequence MR imaging of the lumbar spine was performed. No intravenous contrast was administered.  COMPARISON:  None.  FINDINGS: Segmentation:  5 lumbar type vertebral bodies assumed.  Alignment: 4 mm degenerative anterolisthesis L3-4. 5 mm  degenerative anterolisthesis L4-5.  Vertebrae: Discogenic endplate marrow changes at L3-4 and L4-5 which could relate to back pain.  Conus medullaris and cauda equina: Conus extends to the T12-L1 level. Conus and cauda equina appear normal.  Paraspinal and other soft tissues: Negative  Disc levels:  T11-12, T12-L1 and L1-2: Mild, non-compressive disc bulges.  L2-3: Moderate disc bulge. Mild facet and ligamentous hypertrophy. Mild narrowing of the lateral recesses but without likely neural compression.  L3-4: Chronic facet arthropathy with 4 mm of anterolisthesis. Bulging of the disc. Facet and ligamentous hypertrophy. Moderate multifactorial spinal stenosis that could cause neural compression on either or both sides. Foraminal narrowing on the left that could affect the exiting L3 nerve. Discogenic endplate marrow changes could contribute to back pain.  L4-5: Chronic facet arthropathy with 5 mm of anterolisthesis. Shallow protrusion of the disc. Moderate multifactorial stenosis with potential for neural compression in both lateral recesses and both neural foramina. Discogenic endplate marrow changes could contribute to back pain.  L5-S1: Mild bulging of the disc. Bilateral facet osteoarthritis. No compressive canal or foraminal narrowing. Facet arthritis could contribute to low back pain.  IMPRESSION: L3-4: Moderate multifactorial spinal stenosis that could cause neural compression on either or both sides. Facet arthropathy with 4 mm of anterolisthesis and bulging of the disc. Additional left foraminal narrowing could possibly affect the left L3 nerve. Discogenic endplate marrow edema could contribute to low back pain.  L4-5: Moderate multifactorial stenosis most severe affecting the lateral recesses and neural foramina. Facet arthropathy with 5 mm of anterolisthesis and bulging of the disc. Neural compression could occur on either side. Discogenic endplate marrow edema  could contribute to low back pain.  L5-S1: Bilateral facet osteoarthritis that could contribute to low back pain.  Disc bulges at L2-3 and above without compressive stenosis.   Electronically Signed By: Nelson Chimes M.D. On: 08/02/2020 07:53  Complexity Note: Imaging results reviewed. Results shared with Ms. Hockenberry, using Layman's terms.                         ROS  Cardiovascular: Daily Aspirin intake and Heart murmur Pulmonary or Respiratory: Snoring  and Temporary stoppage of breathing during sleep Neurological: No reported neurological signs or symptoms such as seizures, abnormal skin sensations, urinary and/or fecal incontinence, being born with an abnormal open spine and/or a tethered spinal cord Psychological-Psychiatric: No reported psychological or psychiatric signs or symptoms such as difficulty sleeping, anxiety, depression, delusions or hallucinations (schizophrenial), mood swings (bipolar disorders) or suicidal ideations or attempts Gastrointestinal: No reported gastrointestinal signs or symptoms such as vomiting or evacuating blood, reflux, heartburn, alternating  episodes of diarrhea and constipation, inflamed or scarred liver, or pancreas or irrregular and/or infrequent bowel movements Genitourinary: No reported renal or genitourinary signs or symptoms such as difficulty voiding or producing urine, peeing blood, non-functioning kidney, kidney stones, difficulty emptying the bladder, difficulty controlling the flow of urine, or chronic kidney disease Hematological: No reported hematological signs or symptoms such as prolonged bleeding, low or poor functioning platelets, bruising or bleeding easily, hereditary bleeding problems, low energy levels due to low hemoglobin or being anemic Endocrine: No reported endocrine signs or symptoms such as high or low blood sugar, rapid heart rate due to high thyroid levels, obesity or weight gain due to slow thyroid or thyroid  disease Rheumatologic: No reported rheumatological signs and symptoms such as fatigue, joint pain, tenderness, swelling, redness, heat, stiffness, decreased range of motion, with or without associated rash Musculoskeletal: Negative for myasthenia gravis, muscular dystrophy, multiple sclerosis or malignant hyperthermia Work History: Retired  Allergies  Ms. Krontz is allergic to amoxicillin-pot clavulanate, ciprofloxacin, clindamycin, minocycline, sucralfate, sulfa antibiotics, and tape.  Laboratory Chemistry Profile   Renal Lab Results  Component Value Date   SPECGRAV 1.025 04/23/2021   PHUR 5.5 04/23/2021   PROTEINUR Negative 04/23/2021     Electrolytes No results found for: "NA", "K", "CL", "CALCIUM", "MG", "PHOS"   Hepatic No results found for: "AST", "ALT", "ALBUMIN", "ALKPHOS", "AMYLASE", "LIPASE", "AMMONIA"   ID No results found for: "LYMEIGGIGMAB", "HIV", "SARSCOV2NAA", "STAPHAUREUS", "MRSAPCR", "HCVAB", "PREGTESTUR", "RMSFIGG", "QFVRPH1IGG", "QFVRPH2IGG"   Bone No results found for: "VD25OH", "VD125OH2TOT", "[id number]", "XB2841LK4", "25OHVITD1", "25OHVITD2", "25OHVITD3", "TESTOFREE", "TESTOSTERONE"   Endocrine Lab Results  Component Value Date   GLUCOSEU Negative 04/23/2021     Neuropathy No results found for: "VITAMINB12", "FOLATE", "HGBA1C", "HIV"   CNS No results found for: "COLORCSF", "APPEARCSF", "RBCCOUNTCSF", "WBCCSF", "POLYSCSF", "LYMPHSCSF", "EOSCSF", "PROTEINCSF", "GLUCCSF", "JCVIRUS", "CSFOLI", "IGGCSF", "LABACHR", "ACETBL"   Inflammation (CRP: Acute  ESR: Chronic) No results found for: "CRP", "ESRSEDRATE", "LATICACIDVEN"   Rheumatology No results found for: "RF", "ANA", "LABURIC", "URICUR", "LYMEIGGIGMAB", "LYMEABIGMQN", "HLAB27"   Coagulation No results found for: "INR", "LABPROT", "APTT", "PLT", "DDIMER", "LABHEMA", "VITAMINK1", "AT3"   Cardiovascular No results found for: "BNP", "CKTOTAL", "CKMB", "TROPONINI", "HGB", "HCT", "LABVMA", "EPIRU",  "EPINEPH24HUR", "NOREPRU", "NOREPI24HUR", "DOPARU", "DOPAM24HRUR"   Screening No results found for: "SARSCOV2NAA", "COVIDSOURCE", "STAPHAUREUS", "MRSAPCR", "HCVAB", "HIV", "PREGTESTUR"   Cancer No results found for: "CEA", "CA125", "LABCA2"   Allergens No results found for: "ALMOND", "APPLE", "ASPARAGUS", "AVOCADO", "BANANA", "BARLEY", "BASIL", "BAYLEAF", "GREENBEAN", "LIMABEAN", "WHITEBEAN", "BEEFIGE", "REDBEET", "BLUEBERRY", "BROCCOLI", "CABBAGE", "MELON", "CARROT", "CASEIN", "CASHEWNUT", "CAULIFLOWER", "CELERY"     Note: Lab results reviewed.  [place]  Drug: Ms. Mehta  reports no history of drug use. Alcohol:  reports no history of alcohol use. Tobacco:  reports that she has never smoked. She has never used smokeless tobacco. Medical:  has a past medical history of Allergy, Arthritis, Cataract, GERD (gastroesophageal reflux disease), Glaucoma, Heart murmur, Myocardial infarction ([place]), Seizures ([place]), Sleep apnea, and Stroke ([place]). Family: family history is not on file.  Past Surgical History:  Procedure Laterality Date   BREAST BIOPSY Right    CORE W/CLIP X 2 - NEG   Active Ambulatory Problems    Diagnosis Date Noted   Lumbar spondylosis 10/11/2021   Lumbar degenerative disc disease 10/11/2021   Chronic bilateral low back pain without sciatica 10/11/2021   Chronic pain syndrome 10/11/2021   Resolved Ambulatory Problems    Diagnosis Date Noted   No Resolved Ambulatory Problems   Past Medical History:  Diagnosis Date  Allergy    Arthritis    Cataract    GERD (gastroesophageal reflux disease)    Glaucoma    Heart murmur    Myocardial infarction ([place])    Seizures ([place])    Sleep apnea    Stroke [facility])    Constitutional Exam  General appearance: Well nourished, well developed, and well hydrated. In no apparent acute distress Vitals:   10/11/21 1319  BP: 115/64  Pulse: 63  Resp: 16  Temp: 97.6 F (36.4 C)  TempSrc: Temporal  SpO2: 100%  Weight: 159 lb 9.6 oz  (72.4 kg)  Height: '5\' 1"'  (1.549 m)   BMI Assessment: Estimated body mass index is 30.16 kg/m as calculated from the following:   Height as of this encounter: '5\' 1"'  (1.549 m).   Weight as of this encounter: 159 lb 9.6 oz (72.4 kg).  BMI interpretation table: BMI level Category Range association with higher incidence of chronic pain  <18 kg/m2 Underweight   18.5-24.9 kg/m2 Ideal body weight   25-29.9 kg/m2 Overweight Increased incidence by 20%  30-34.9 kg/m2 Obese (Class I) Increased incidence by 68%  35-39.9 kg/m2 Severe obesity (Class II) Increased incidence by 136%  >40 kg/m2 Extreme obesity (Class III) Increased incidence by 254%   Patient's current BMI Ideal Body weight  Body mass index is 30.16 kg/m. Ideal body weight: 47.8 kg (105 lb 6.1 oz) Adjusted ideal body weight: 57.6 kg (127 lb 1.1 oz)   BMI Readings from Last 4 Encounters:  10/11/21 30.16 kg/m  04/23/21 30.23 kg/m  10/23/20 30.23 kg/m  03/20/20 30.23 kg/m   Wt Readings from Last 4 Encounters:  10/11/21 159 lb 9.6 oz (72.4 kg)  04/23/21 160 lb (72.6 kg)  10/23/20 160 lb (72.6 kg)  03/20/20 160 lb (72.6 kg)    Psych/Mental status: Alert, oriented x 3 (person, place, & time)       Eyes: PERLA Respiratory: No evidence of acute respiratory distress  Lumbar Spine Area Exam  Skin & Axial Inspection: No masses, redness, or swelling Alignment: Symmetrical Functional ROM: Pain restricted ROM affecting both sides Stability: No instability detected Muscle Tone/Strength: Functionally intact. No obvious neuro-muscular anomalies detected. Sensory (Neurological): Articular pain pattern and facet mediated Palpation: Complains of area being tender to palpation       Provocative Tests: Hyperextension/rotation test: (+) bilaterally for facet joint pain. Lumbar quadrant test (Kemp's test): (+) bilaterally for facet joint pain.  Gait & Posture Assessment  Ambulation: Limited Gait: Antalgic gait (limping) Posture:  Difficulty standing up straight, due to pain  Lower Extremity Exam    Side: Right lower extremity  Side: Left lower extremity  Stability: No instability observed          Stability: No instability observed          Skin & Extremity Inspection: Skin color, temperature, and hair growth are WNL. No peripheral edema or cyanosis. No masses, redness, swelling, asymmetry, or associated skin lesions. No contractures.  Skin & Extremity Inspection: Skin color, temperature, and hair growth are WNL. No peripheral edema or cyanosis. No masses, redness, swelling, asymmetry, or associated skin lesions. No contractures.  Functional ROM: Pain restricted ROM for hip joint          Functional ROM: Pain restricted ROM for hip joint          Muscle Tone/Strength: Functionally intact. No obvious neuro-muscular anomalies detected.  Muscle Tone/Strength: Functionally intact. No obvious neuro-muscular anomalies detected.  Sensory (Neurological): Unimpaired  Sensory (Neurological): Unimpaired        DTR: Patellar: deferred today Achilles: deferred today Plantar: deferred today  DTR: Patellar: deferred today Achilles: deferred today Plantar: deferred today  Palpation: No palpable anomalies  Palpation: No palpable anomalies    Assessment  Primary Diagnosis & Pertinent Problem List: The primary encounter diagnosis was Lumbar facet arthropathy. Diagnoses of Lumbar spondylosis, Lumbar degenerative disc disease, Chronic bilateral low back pain without sciatica, and Chronic pain syndrome were also pertinent to this visit.  Visit Diagnosis (New problems to examiner): 1. Lumbar facet arthropathy   2. Lumbar spondylosis   3. Lumbar degenerative disc disease   4. Chronic bilateral low back pain without sciatica   5. Chronic pain syndrome    Plan of Care (Initial workup plan)   I discussed Sprint peripheral nerve stimulator with the patient, in detail utilizing a spine model.  I explained to them the duration of  treatment along with risks and benefits.  I explained to them that this could be an option for patients that have had limited response to medial branch nerve blocks and/or RFA in the context of having severe lumbar degenerative disc disease and facet arthropathy.  Risk and benefits were reviewed and patient would like to proceed.   Procedure Orders         Implantable Peripheral Nerve Stimulator         Implantable Peripheral Nerve Stimulator     Orders Placed This Encounter  Procedures   Implantable Peripheral Nerve Stimulator    Standing Status:   Future    Standing Expiration Date:   12/12/2021    Scheduling Instructions:     Procedure: Temporary implantable peripheral nerve stimulator     Side:  Left     Nerve site: L4     Sedation: without     Timeframe: ASAA    Order Specific Question:   Location of Procedure    Answer:   [facility]   Implantable Peripheral Nerve Stimulator    Standing Status:   Future    Standing Expiration Date:   12/12/2021    Scheduling Instructions:     Procedure: Temporary implantable peripheral nerve stimulator     Side:  RIGHT     Nerve site: L4     Sedation: without     Timeframe: ASAA    Order Specific Question:   Location of Procedure    Answer:   [facility]     Provider-requested follow-up: Return in about 11 days (around 10/22/2021) for Left L4 SPRINT PNS, in clinic NS.  I spent a total of 60 minutes reviewing chart data, face-to-face evaluation with the patient, counseling and coordination of care as detailed above.   Future Appointments  Date Time Provider [place]  04/22/2022  1:15 PM Bjorn Loser, MD [facility] None    Note by: Stacey Santa, MD Date: 10/11/2021; Time: 2:38 PM

## 2021-10-12 ENCOUNTER — Emergency Department
Admission: EM | Admit: 2021-10-12 | Discharge: 2021-10-12 | Disposition: A | Discharge status: Home / Self Care | Payer: PPO | Attending: Emergency Medicine | Admitting: Emergency Medicine

## 2021-10-12 ENCOUNTER — Other Ambulatory Visit: Payer: Self-pay

## 2021-10-12 ENCOUNTER — Emergency Department: Payer: PPO

## 2021-10-12 DIAGNOSIS — R55 Syncope and collapse: Secondary | ICD-10-CM | POA: Diagnosis not present

## 2021-10-12 DIAGNOSIS — R001 Bradycardia, unspecified: Secondary | ICD-10-CM | POA: Insufficient documentation

## 2021-10-12 DIAGNOSIS — R531 Weakness: Secondary | ICD-10-CM | POA: Diagnosis not present

## 2021-10-12 DIAGNOSIS — R519 Headache, unspecified: Secondary | ICD-10-CM | POA: Insufficient documentation

## 2021-10-12 DIAGNOSIS — R600 Localized edema: Secondary | ICD-10-CM | POA: Insufficient documentation

## 2021-10-12 LAB — CBC WITH DIFFERENTIAL/PLATELET
Abs Immature Granulocytes: 0.01 10*3/uL (ref 0.00–0.07)
Basophils Absolute: 0 10*3/uL (ref 0.0–0.1)
Basophils Relative: 1 %
Eosinophils Absolute: 0.2 10*3/uL (ref 0.0–0.5)
Eosinophils Relative: 4 %
HCT: 37.6 % (ref 36.0–46.0)
Hemoglobin: 11.8 g/dL — ABNORMAL LOW (ref 12.0–15.0)
Immature Granulocytes: 0 %
Lymphocytes Relative: 35 %
Lymphs Abs: 1.8 10*3/uL (ref 0.7–4.0)
MCH: 30.4 pg (ref 26.0–34.0)
MCHC: 31.4 g/dL (ref 30.0–36.0)
MCV: 96.9 fL (ref 80.0–100.0)
Monocytes Absolute: 0.5 10*3/uL (ref 0.1–1.0)
Monocytes Relative: 9 %
Neutro Abs: 2.7 10*3/uL (ref 1.7–7.7)
Neutrophils Relative %: 51 %
Platelets: 231 10*3/uL (ref 150–400)
RBC: 3.88 MIL/uL (ref 3.87–5.11)
RDW: 13.2 % (ref 11.5–15.5)
WBC: 5.2 10*3/uL (ref 4.0–10.5)
nRBC: 0 % (ref 0.0–0.2)

## 2021-10-12 LAB — COMPREHENSIVE METABOLIC PANEL
ALT: 15 U/L (ref 0–44)
AST: 18 U/L (ref 15–41)
Albumin: 3.4 g/dL — ABNORMAL LOW (ref 3.5–5.0)
Alkaline Phosphatase: 110 U/L (ref 38–126)
Anion gap: 7 (ref 5–15)
BUN: 21 mg/dL (ref 8–23)
CO2: 24 mmol/L (ref 22–32)
Calcium: 9.3 mg/dL (ref 8.9–10.3)
Chloride: 109 mmol/L (ref 98–111)
Creatinine, Ser: 0.76 mg/dL (ref 0.44–1.00)
GFR, Estimated: >60 mL/min (ref >60)
Glucose, Bld: 116 mg/dL — ABNORMAL HIGH (ref 70–99)
Potassium: 4.3 mmol/L (ref 3.5–5.1)
Sodium: 140 mmol/L (ref 135–145)
Total Bilirubin: 0.5 mg/dL (ref 0.3–1.2)
Total Protein: 6.9 g/dL (ref 6.5–8.1)

## 2021-10-12 LAB — LACTIC ACID, PLASMA: Lactic Acid, Venous: 1.4 mmol/L (ref 0.5–1.9)

## 2021-10-12 LAB — BRAIN NATRIURETIC PEPTIDE: B Natriuretic Peptide: 129.7 pg/mL — ABNORMAL HIGH (ref 0.0–100.0)

## 2021-10-12 LAB — TROPONIN I (HIGH SENSITIVITY): Troponin I (High Sensitivity): 4 ng/L (ref <18)

## 2021-10-12 NOTE — ED Triage Notes (Signed)
Pt presents to ED via EMS with c/o of having a near syncopal yesterday but c/o of weakness today. Pt is A&Ox4. EMS states family states pt is normally weak and did endorse she walked to stretcher from her home and "just wanted to make sure everything is okay" . CBG 122 per EMS. Pt denies any pain at this time other than a HA she wakes up with "everyday'', pt denies any difference in this headache than normal and states it has to do "with my sugar". VSS.  Pt has HX of Alzheimers.

## 2021-10-12 NOTE — ED Notes (Signed)
Pt ambulated without assistance from room down hall and back. Provider visualized pt ambulating. Pt sts " I feel great!"

## 2021-10-12 NOTE — ED Provider Notes (Signed)
[facility] Provider Note    Event Date/Time   First MD Initiated Contact with Patient 10/12/21 1050     (approximate)   History   Weakness   HPI  Stacey Warner is a 88 y.o. female who reports she felt weak in the middle of the night when she tried to sit up and had trouble doing it.  She is not having any problems now.  She is not having any cough or chest pain or bellyache or nausea or vomiting or shortness of breath. Patient reports of headache every morning for the last year or 2 possibly 3.  May be getting somewhat worse.  Usually goes away with some Tylenol or other over-the-counter medicine.     Physical Exam   Triage Vital Signs: ED Triage Vitals  Enc Vitals Group     BP 10/12/21 1053 131/82     Pulse Rate 10/12/21 1053 (!) 55     Resp 10/12/21 1053 15     Temp 10/12/21 1053 97.7 F (36.5 C)     Temp Source 10/12/21 1053 Oral     SpO2 10/12/21 1053 99 %     Weight --      Height --      Head Circumference --      Peak Flow --      Pain Score 10/12/21 1054 0     Pain Loc --      Pain Edu? --      Excl. in GC? --     Most recent vital signs: Vitals:   10/12/21 1130 10/12/21 1345  BP: 123/75 128/75  Pulse: (!) 53 (!) 54  Resp: 15 14  Temp:    SpO2: 98% 100%     General: Awake, no distress.  CV:  Good peripheral perfusion.  Heart regular rate and rhythm no audible murmurs Resp:  Normal effort.  Lungs are clear Abd:  No distention.  Abdomen soft bowel sounds positive nontender Extremities slight trace edema   ED Results / Procedures / Treatments   Labs (all labs ordered are listed, but only abnormal results are displayed) Labs Reviewed  COMPREHENSIVE METABOLIC PANEL - Abnormal; Notable for the following components:      Result Value   Glucose, Bld 116 (*)    Albumin 3.4 (*)    All other components within normal limits  CBC WITH DIFFERENTIAL/PLATELET - Abnormal; Notable for the following components:   Hemoglobin 11.8  (*)    All other components within normal limits  BRAIN NATRIURETIC PEPTIDE - Abnormal; Notable for the following components:   B Natriuretic Peptide 129.7 (*)    All other components within normal limits  LACTIC ACID, PLASMA  BLOOD GAS, VENOUS  LACTIC ACID, PLASMA  URINALYSIS, ROUTINE W REFLEX MICROSCOPIC  TROPONIN I (HIGH SENSITIVITY)  TROPONIN I (HIGH SENSITIVITY)     EKG  EKG read interpreted by me shows sinus bradycardia rate of 57 left axis no acute ST-T changes   RADIOLOGY CT of the head shows no acute disease radiologist read the film I reviewed it and enter Chest x-ray also read by radiology reviewed and interpreted by me shows no acute disease  PROCEDURES:  Critical Care performed:   Procedures   MEDICATIONS ORDERED IN ED: Medications - No data to display   IMPRESSION / MDM / ASSESSMENT AND PLAN / ED COURSE  I reviewed the triage vital signs and the nursing notes. Patient doing well in the emergency department.  She  is able to ambulate without any difficulty walks around the nurses desk.  She looks well. Differential diagnosis includes, but is not limited to, there is no sign of any brain tumor or other cause for chronic morning headache.  Patient is not hypertensive.  She has some slight bradycardia but nothing serious.  This does not interfere with her ambulation.  Patient's presentation is most consistent with acute complicated illness / injury requiring diagnostic workup.  The patient is on the cardiac monitor to evaluate for evidence of arrhythmia and/or significant heart rate changes.  Only some mild bradycardia seen      FINAL CLINICAL IMPRESSION(S) / ED DIAGNOSES   Final diagnoses:  Weakness     Rx / DC Orders   ED Discharge Orders     None        Note:  This document was prepared using Dragon voice recognition software and may include unintentional dictation errors.   Arnaldo Natal, MD 10/12/21 [phone]

## 2021-10-12 NOTE — Discharge Instructions (Addendum)
The blood work and everything that we have checked on you today looks okay.  Please return if you are worsening.  Please follow-up with your regular doctor within the week.  Continue taking your medicines.

## 2021-10-15 ENCOUNTER — Encounter: Payer: Self-pay | Admitting: Student in an Organized Health Care Education/Training Program

## 2021-10-18 ENCOUNTER — Emergency Department: Payer: PPO

## 2021-10-18 ENCOUNTER — Emergency Department
Admission: EM | Admit: 2021-10-18 | Discharge: 2021-10-18 | Disposition: A | Discharge status: Home / Self Care | Payer: PPO | Attending: Emergency Medicine | Admitting: Emergency Medicine

## 2021-10-18 ENCOUNTER — Other Ambulatory Visit: Payer: Self-pay

## 2021-10-18 ENCOUNTER — Encounter: Payer: Self-pay | Admitting: Emergency Medicine

## 2021-10-18 DIAGNOSIS — R001 Bradycardia, unspecified: Secondary | ICD-10-CM | POA: Insufficient documentation

## 2021-10-18 DIAGNOSIS — R531 Weakness: Secondary | ICD-10-CM | POA: Diagnosis not present

## 2021-10-18 DIAGNOSIS — R55 Syncope and collapse: Secondary | ICD-10-CM | POA: Diagnosis not present

## 2021-10-18 DIAGNOSIS — F0390 Unspecified dementia without behavioral disturbance: Secondary | ICD-10-CM | POA: Diagnosis not present

## 2021-10-18 DIAGNOSIS — R42 Dizziness and giddiness: Secondary | ICD-10-CM | POA: Diagnosis not present

## 2021-10-18 LAB — CBC
HCT: 35.1 % — ABNORMAL LOW (ref 36.0–46.0)
Hemoglobin: 11.4 g/dL — ABNORMAL LOW (ref 12.0–15.0)
MCH: 30.5 pg (ref 26.0–34.0)
MCHC: 32.5 g/dL (ref 30.0–36.0)
MCV: 93.9 fL (ref 80.0–100.0)
Platelets: 218 10*3/uL (ref 150–400)
RBC: 3.74 MIL/uL — ABNORMAL LOW (ref 3.87–5.11)
RDW: 13.2 % (ref 11.5–15.5)
WBC: 6.2 10*3/uL (ref 4.0–10.5)
nRBC: 0 % (ref 0.0–0.2)

## 2021-10-18 LAB — URINALYSIS, ROUTINE W REFLEX MICROSCOPIC
Bilirubin Urine: NEGATIVE
Glucose, UA: NEGATIVE mg/dL
Hgb urine dipstick: NEGATIVE
Ketones, ur: NEGATIVE mg/dL
Nitrite: NEGATIVE
Protein, ur: NEGATIVE mg/dL
Specific Gravity, Urine: 1.008 (ref 1.005–1.030)
pH: 6 (ref 5.0–8.0)

## 2021-10-18 LAB — TROPONIN I (HIGH SENSITIVITY): Troponin I (High Sensitivity): 4 ng/L (ref <18)

## 2021-10-18 LAB — BASIC METABOLIC PANEL
Anion gap: 7 (ref 5–15)
BUN: 29 mg/dL — ABNORMAL HIGH (ref 8–23)
CO2: 22 mmol/L (ref 22–32)
Calcium: 9.2 mg/dL (ref 8.9–10.3)
Chloride: 109 mmol/L (ref 98–111)
Creatinine, Ser: 0.87 mg/dL (ref 0.44–1.00)
GFR, Estimated: >60 mL/min (ref >60)
Glucose, Bld: 96 mg/dL (ref 70–99)
Potassium: 4.2 mmol/L (ref 3.5–5.1)
Sodium: 138 mmol/L (ref 135–145)

## 2021-10-18 NOTE — ED Provider Notes (Signed)
[facility] Provider Note    Event Date/Time   First MD Initiated Contact with Patient 10/18/21 1221     (approximate)  History   Chief Complaint: Dizziness  HPI  Stacey Warner is a 88 y.o. female with a past medical history of a prior CVA, prior MI, dementia, presents to the emergency department for dizziness.  According to the sister who lives with the patient she states over the past year or so the patient has been experiencing episodes of dizziness followed by a brief loss of consciousness or near loss of consciousness at times.  This morning she states the patient sat up in bed and became very dizzy with a spinning sensation and briefly lost consciousness or nearly lost consciousness per sister.  Patient has mild dementia is able to give a good history regarding the events this morning denies any chest pain at any point denies any diaphoresis but did state some nausea when the room was spinning.  Sister states this has been occurring for approximately 1 year or so they have seen her neurologist multiple times as well as her primary care doctor but they have not found a cause as of yet.  They have not seen a cardiologist and have not had a Holter monitor performed.  Physical Exam   Triage Vital Signs: ED Triage Vitals  Enc Vitals Group     BP 10/18/21 1212 127/74     Pulse Rate 10/18/21 1212 (!) 53     Resp 10/18/21 1212 18     Temp 10/18/21 1212 97.9 F (36.6 C)     Temp Source 10/18/21 1212 Oral     SpO2 10/18/21 1212 95 %     Weight 10/18/21 1228 159 lb 6.3 oz (72.3 kg)     Height 10/18/21 1228 5\' 1"  (1.549 m)     Head Circumference --      Peak Flow --      Pain Score 10/18/21 1200 0     Pain Loc --      Pain Edu? --      Excl. in GC? --     Most recent vital signs: Vitals:   10/18/21 1212  BP: 127/74  Pulse: (!) 53  Resp: 18  Temp: 97.9 F (36.6 C)  SpO2: 95%    General: Awake, no distress.  CV:  Good peripheral perfusion.  Regular  rate and rhythm  Resp:  Normal effort.  Equal breath sounds bilaterally.  Abd:  No distention.  Soft, nontender.  No rebound or guarding.    ED Results / Procedures / Treatments   EKG  EKG viewed and interpreted by myself shows sinus bradycardia 55 bpm with a narrow QRS, normal axis, normal intervals, no concerning ST changes.  No ST elevation.  RADIOLOGY  I personally reviewed and interpreted the MRI images I do not see any obvious large abnormality on my evaluation. Radiology is read the MRI and MRA is negative.   MEDICATIONS ORDERED IN ED: Medications - No data to display   IMPRESSION / MDM / ASSESSMENT AND PLAN / ED COURSE  I reviewed the triage vital signs and the nursing notes.  Patient's presentation is most consistent with acute presentation with potential threat to life or bodily function.  Patient presents to the emergency department for cute onset of dizziness and loss of consciousness or near loss of consciousness this morning.  Sister states this has been an intermittent issue over the past 1 year and  last occurred earlier this week when the patient was seen here on Monday.  I reviewed the patient's visit she had lab work performed and a CT scan of the head that was reassuring.  Here the patient appears well, no distress denies any symptoms currently.  Patient's description of sitting up in bed with spinning is suggestive of BPPV however given the patient's history of CVA previously I believe it is important to rule out CVA.  We will proceed with an MR/MRA of the brain to rule out CVA/bleed/mass/aneurysm.  We will check basic labs as well as a urinalysis and I have added on cardiac enzymes.  EKG is reassuring.  Patient and daughter are agreeable to the work-up and plan of care.  MRI/MRA negative.  Urinalysis does not appear to show any concerning findings.  Overall reassuring work-up we will discharge with cardiology follow-up.  Patient and sister agreeable to plan.  FINAL  CLINICAL IMPRESSION(S) / ED DIAGNOSES   Dizziness Syncope  Note:  This document was prepared using Dragon voice recognition software and may include unintentional dictation errors.   Minna Antis, MD 10/18/21 [phone]

## 2021-10-18 NOTE — ED Triage Notes (Signed)
Pt BIB [facility] from home with reports of dizziness. Pt reports she had a recent visit with her PCP for the same and pt reports "everything was normal."

## 2021-10-23 ENCOUNTER — Ambulatory Visit (INDEPENDENT_AMBULATORY_CARE_PROVIDER_SITE_OTHER): Payer: PPO

## 2021-10-23 ENCOUNTER — Encounter: Payer: Self-pay | Admitting: Cardiovascular Disease

## 2021-10-23 ENCOUNTER — Telehealth: Payer: Self-pay

## 2021-10-23 ENCOUNTER — Ambulatory Visit: Payer: PPO | Admitting: Cardiovascular Disease

## 2021-10-23 VITALS — BP 133/82 | HR 69 | Ht 61.0 in | Wt 159.5 lb

## 2021-10-23 DIAGNOSIS — R42 Dizziness and giddiness: Secondary | ICD-10-CM

## 2021-10-23 DIAGNOSIS — F0280 Dementia in other diseases classified elsewhere without behavioral disturbance: Secondary | ICD-10-CM

## 2021-10-23 DIAGNOSIS — R55 Syncope and collapse: Secondary | ICD-10-CM | POA: Diagnosis not present

## 2021-10-23 DIAGNOSIS — G309 Alzheimer's disease, unspecified: Secondary | ICD-10-CM | POA: Diagnosis not present

## 2021-10-23 DIAGNOSIS — M47816 Spondylosis without myelopathy or radiculopathy, lumbar region: Secondary | ICD-10-CM

## 2021-10-23 NOTE — Telephone Encounter (Signed)
Her insurance company is requesting a pysc eval for the PNS.

## 2021-10-23 NOTE — Progress Notes (Signed)
Cardiology Office Note  Date:  10/23/2021   ID:  NAWAAL ALLING, DOB 1932/11/15, MRN [id number]  PCP:  Marguarite Arbour, MD   Chief Complaint  Patient presents with   Other    C/o dizziness. Meds reviewed verbally with pt.    HPI:  Ms. Rut Betterton is a 88 year old woman with past medical history of Prior stroke,  dementia Recently evaluated in the emergency room for dizziness October 18, 2021 Who presents by referral from the ER for dizziness  In the ER 10/12/21: weakness, Med alert care to house, "nothing showed up", went to the ER  In the Er 10/18/21  over the past year or so the patient has been experiencing episodes of dizziness followed by a brief loss of consciousness or near loss of consciousness at times.  October 19, 2021, she states the patient sat up in bed and became very dizzy with a spinning sensation and briefly lost consciousness or nearly lost consciousness per sister. denies any diaphoresis but did state some nausea when the room was spinning.  Sister states this has been occurring for approximately 1-2 years or so they have seen her neurologist multiple times as well as her primary care doctor but they have not found a cause as of yet.    MR angio head with no acute process  Echo 04/2021: normal Ef Carotid u/s done at [facility], results not available for review  Sister reports that she has not witnessed these episodes  EKG personally reviewed by myself on todays visit NSR rate 69 bpm no significant St or T wave changes  PMH:   has a past medical history of Allergy, Arthritis, Cataract, GERD (gastroesophageal reflux disease), Glaucoma, Heart murmur, Myocardial infarction (HCC), Seizures (HCC), Sleep apnea, and Stroke (HCC).  PSH:    Past Surgical History:  Procedure Laterality Date   BREAST BIOPSY Right    CORE W/CLIP X 2 - NEG    Current Outpatient Medications  Medication Sig Dispense Refill   Aspirin-Caffeine (ANACIN PO) Take by mouth as needed.      azelastine (ASTELIN) 0.1 % nasal spray      Baclofen 5 MG TABS TAKE 1 TABLET (5 MG TOTAL) BY MOUTH NIGHTLY AS NEEDED FOR UP TO 30 DAYS     donepezil (ARICEPT) 10 MG tablet Take 10 mg by mouth at bedtime.     dorzolamide-timolol (COSOPT) 22.3-6.8 MG/ML ophthalmic solution 1 drop daily in the afternoon.     doxepin (SINEQUAN) 25 MG capsule Take 25 mg by mouth at bedtime.     famotidine (PEPCID) 20 MG tablet Take 20 mg by mouth 2 (two) times daily.     fluticasone (FLONASE) 50 MCG/ACT nasal spray 1 spray 2 (two) times daily at 10 AM and 5 PM.     latanoprost (XALATAN) 0.005 % ophthalmic solution at bedtime.     levocetirizine (XYZAL) 5 MG tablet SMARTSIG:1 Tablet(s) By Mouth Every Evening     magnesium chloride (SLOW-MAG) 64 MG TBEC SR tablet Take by mouth daily.     memantine (NAMENDA) 5 MG tablet Take 5 mg by mouth 2 (two) times daily.     montelukast (SINGULAIR) 10 MG tablet TAKE ONE TABLET DAILY EACH EVENING PRIOR TO BEDTIME  10   Polyethylene Glycol 3350 (MIRALAX PO) Take by mouth.     ranitidine (ZANTAC) 300 MG capsule Take by mouth.     rOPINIRole (REQUIP) 1 MG tablet Take 1 tablet (1 mg total) by mouth at bedtime.  sertraline (ZOLOFT) 50 MG tablet Take 50 mg by mouth daily.     tolterodine (DETROL LA) 4 MG 24 hr capsule Take 1 capsule (4 mg total) by mouth daily. 90 capsule 3   triamcinolone cream (KENALOG) 0.1 % Apply topically.     No current facility-administered medications for this visit.     Allergies:   Amoxicillin-pot clavulanate, Ciprofloxacin, Clindamycin, Minocycline, Sucralfate, Sulfa antibiotics, and Tape   Social History:  The patient  reports that she has never smoked. She has never used smokeless tobacco. She reports that she does not drink alcohol and does not use drugs.   Family History:   family history includes Stroke in her father and mother.    Review of Systems: Review of Systems  Constitutional: Negative.   HENT: Negative.    Respiratory: Negative.     Cardiovascular: Negative.   Gastrointestinal: Negative.   Musculoskeletal: Negative.   Neurological:  Positive for dizziness.  Psychiatric/Behavioral: Negative.    All other systems reviewed and are negative.    PHYSICAL EXAM: VS:  BP 133/82 (BP Location: Right Arm, Patient Position: Sitting, Cuff Size: Normal)   Pulse 69   Ht 5\' 1"  (1.549 m)   Wt 159 lb 8 oz (72.3 kg)   SpO2 97%   BMI 30.14 kg/m  , BMI Body mass index is 30.14 kg/m. GEN: Well nourished, well developed, in no acute distress HEENT: normal Neck: no JVD, carotid bruits, or masses Cardiac: RRR; no murmurs, rubs, or gallops,no edema  Respiratory:  clear to auscultation bilaterally, normal work of breathing GI: soft, nontender, nondistended, + BS MS: no deformity or atrophy Skin: warm and dry, no rash Neuro:  Strength and sensation are intact Psych: euthymic mood, full affect   Recent Labs: 10/12/2021: ALT 15; B Natriuretic Peptide 129.7 10/18/2021: BUN 29; Creatinine, Ser 0.87; Hemoglobin 11.4; Platelets 218; Potassium 4.2; Sodium 138    Lipid Panel No results found for: "CHOL", "HDL", "LDLCALC", "TRIG"    Wt Readings from Last 3 Encounters:  10/23/21 159 lb 8 oz (72.3 kg)  10/18/21 159 lb 6.3 oz (72.3 kg)  10/11/21 159 lb 9.6 oz (72.4 kg)       ASSESSMENT AND PLAN:  Problem List Items Addressed This Visit   None Visit Diagnoses     Dizziness    -  Primary   Relevant Orders   EKG 12-Lead   LONG TERM MONITOR (3-14 DAYS)   Alzheimer's dementia, unspecified dementia severity, unspecified timing of dementia onset, unspecified whether behavioral, psychotic, or mood disturbance or anxiety (HCC)       Relevant Medications   donepezil (ARICEPT) 10 MG tablet   rOPINIRole (REQUIP) 1 MG tablet   Near syncope          Near syncope/dizziness Etiology unclear, has been seen in the emergency room twice recently, neurology and primary care aware Blood pressure stable, EKG normal Echocardiogram normal,  carotid ultrasound done by primary care results unavailable MRI head with no acute findings Unable to exclude cardiac arrhythmia, we have ordered a Zio monitor for further evaluation  Alzheimer's dementia Managed by neurology, high functioning, family supports    Total encounter time more than 60 minutes  Greater than 50% was spent in counseling and coordination of care with the patient    Signed, 10/13/21, M.D., Ph.D. [facility] [place], [place]

## 2021-10-23 NOTE — Patient Instructions (Addendum)
Medication Instructions:  Stop the cyclobenzaprine/flexeril  Call Dr. Judithann Sheen for refill on ropinerole  If you need a refill on your cardiac medications before your next appointment, please call your pharmacy.   Lab work: No new labs needed  Testing/Procedures:  Your provider has ordered a heart monitor to wear for 14 days. This will be mailed to your home with instructions on placement. Once you have finished the time frame requested, you will return monitor in box provided.     Follow-Up: At [facility], you and your health needs are our priority.  As part of our continuing mission to provide you with exceptional heart care, we have created designated Provider Care Teams.  These Care Teams include your primary Cardiologist (physician) and Advanced Practice Providers (APPs -  Physician Assistants and Nurse Practitioners) who all work together to provide you with the care you need, when you need it.  You will need a follow up appointment as needed  Providers on your designated Care Team:   Nicolasa Ducking, NP Eula Listen, PA-C Cadence Fransico [place], [place]  COVID-19 Vaccine Information can be found at: [email or web address] For questions related to vaccine distribution or appointments, please email [email or web address] or call [phone].

## 2021-10-23 NOTE — Telephone Encounter (Signed)
Yes please put in a referral for [facility]

## 2021-10-24 LAB — BLOOD GAS, VENOUS
Acid-base deficit: 0.8 mmol/L (ref 0.0–2.0)
Bicarbonate: 25.4 mmol/L (ref 20.0–28.0)
O2 Saturation: 33.4 %
Patient temperature: 37
pCO2, Ven: 47 mmHg (ref 44–60)
pH, Ven: 7.34 (ref 7.25–7.43)

## 2021-10-26 DIAGNOSIS — R42 Dizziness and giddiness: Secondary | ICD-10-CM

## 2021-10-31 ENCOUNTER — Encounter: Payer: Self-pay | Admitting: Cardiovascular Disease

## 2021-11-12 ENCOUNTER — Encounter: Payer: Self-pay | Admitting: Student in an Organized Health Care Education/Training Program

## 2021-11-12 DIAGNOSIS — R739 Hyperglycemia, unspecified: Secondary | ICD-10-CM | POA: Diagnosis not present

## 2021-11-12 DIAGNOSIS — E782 Mixed hyperlipidemia: Secondary | ICD-10-CM | POA: Diagnosis not present

## 2021-11-12 DIAGNOSIS — R413 Other amnesia: Secondary | ICD-10-CM | POA: Diagnosis not present

## 2021-11-12 DIAGNOSIS — Z79899 Other long term (current) drug therapy: Secondary | ICD-10-CM | POA: Diagnosis not present

## 2021-11-14 DIAGNOSIS — R42 Dizziness and giddiness: Secondary | ICD-10-CM | POA: Diagnosis not present

## 2021-12-03 ENCOUNTER — Ambulatory Visit
Admission: RE | Admit: 2021-12-03 | Discharge: 2021-12-03 | Disposition: A | Discharge status: Home / Self Care | Payer: PPO | Source: Ambulatory Visit | Attending: Student in an Organized Health Care Education/Training Program | Admitting: Student in an Organized Health Care Education/Training Program

## 2021-12-03 ENCOUNTER — Encounter: Payer: Self-pay | Admitting: Student in an Organized Health Care Education/Training Program

## 2021-12-03 ENCOUNTER — Ambulatory Visit
Payer: PPO | Attending: Student in an Organized Health Care Education/Training Program | Admitting: Student in an Organized Health Care Education/Training Program

## 2021-12-03 DIAGNOSIS — G894 Chronic pain syndrome: Secondary | ICD-10-CM | POA: Insufficient documentation

## 2021-12-03 DIAGNOSIS — M545 Low back pain, unspecified: Secondary | ICD-10-CM | POA: Insufficient documentation

## 2021-12-03 DIAGNOSIS — M47816 Spondylosis without myelopathy or radiculopathy, lumbar region: Secondary | ICD-10-CM | POA: Insufficient documentation

## 2021-12-03 DIAGNOSIS — M5136 Other intervertebral disc degeneration, lumbar region: Secondary | ICD-10-CM | POA: Diagnosis not present

## 2021-12-03 DIAGNOSIS — G8929 Other chronic pain: Secondary | ICD-10-CM | POA: Diagnosis not present

## 2021-12-03 MED ORDER — LIDOCAINE HCL 2 % IJ SOLN
20.0000 mL | Freq: Once | INTRAMUSCULAR | Status: AC
  Administered 2021-12-03: 400 mg

## 2021-12-03 MED ORDER — ROPIVACAINE HCL 2 MG/ML IJ SOLN
INTRAMUSCULAR | Status: AC
  Filled 2021-12-03: qty 20

## 2021-12-03 MED ORDER — LIDOCAINE HCL 2 % IJ SOLN
INTRAMUSCULAR | Status: AC
  Filled 2021-12-03: qty 20

## 2021-12-03 MED ORDER — ROPIVACAINE HCL 2 MG/ML IJ SOLN
9.0000 mL | Freq: Once | INTRAMUSCULAR | Status: AC
  Administered 2021-12-03: 9 mL via PERINEURAL

## 2021-12-03 NOTE — Progress Notes (Signed)
Safety precautions to be maintained throughout the outpatient stay will include: orient to surroundings, keep bed in low position, maintain call bell within reach at all times, provide assistance with transfer out of bed and ambulation.  

## 2021-12-03 NOTE — Progress Notes (Signed)
PROVIDER NOTE: Interpretation of information contained herein should be left to medically-trained personnel. Specific patient instructions are provided elsewhere under "Patient Instructions" section of medical record. This document was created in part using STT-dictation technology, any transcriptional errors that may result from this process are unintentional.  Patient: Stacey Warner Type: Established DOB: 12-06-1932 MRN: [id number] PCP: Marguarite Arbour, MD  Service: Procedure DOS: 12/03/2021 Setting: Ambulatory Location: Ambulatory outpatient facility Delivery: Face-to-face Provider: Edward Jolly, MD Specialty: Interventional Pain Management Specialty designation: 09 Location: Outpatient facility Ref. Prov.: Edward Jolly, MD    Primary Reason for Admission: Surgical management of chronic pain condition.  Procedure:  Anesthesia, Analgesia, Anxiolysis:  Type: SPRINTTM Peripheral Nerve Field Stimulator (PNS) MicroLeadTM Implant Purpose: Therapeutic Region: Lumbar Level: L4-5 Facet Medial Branch Nerve Laterality: Left           Anesthesia: Local (1-2% Lidocaine)  Anxiolysis: None  Sedation: None  Guidance: Fluoroscopy & [place] CPT (14970)    1. Lumbar facet arthropathy   2. Lumbar spondylosis   3. Lumbar degenerative disc disease   4. Chronic bilateral low back pain without sciatica   5. Chronic pain syndrome    NAS-11 Pain score:   Pre-procedure: 5/10   Post-procedure: 0/10   Pre-op H&P Assessment:  Ms. Stacey Warner is a 88 y.o. (year old), female patient, seen today for interventional treatment. She  has a past surgical history that includes Breast biopsy (Right).  Initial Vital Signs:  Pulse/EKG Rate: 60ECG Heart Rate: (!) 56 Temp: (!) 97.3 F (36.3 C) Resp: 13 BP: 118/70 SpO2: 99 %  BMI: Estimated body mass index is 25.61 kg/m as calculated from the following:   Height as of this encounter: 5\' 2"  (1.575 m).   Weight as of this encounter: 140 lb (63.5 kg).  Risk  Assessment: Allergies: Reviewed. She is allergic to amoxicillin-pot clavulanate, ciprofloxacin, clindamycin, minocycline, sucralfate, sulfa antibiotics, and tape.  Allergy Precautions: None required Coagulopathies: Reviewed. None identified.  Blood-thinner therapy: None at this time Active Infection(s): Reviewed. None identified. Ms. Stacey Warner is afebrile  Site Confirmation: Ms. Stacey Warner was asked to confirm the procedure and laterality before marking the site, which she did. Procedure checklist: Completed Consent: Before the procedure and under the influence of no sedative(s), amnesic(s), or anxiolytics, the patient was informed of the treatment options, risks and possible complications. To fulfill our ethical and legal obligations, as recommended by the American Medical Association's Code of Ethics, I have informed the patient of my clinical impression; the nature and purpose of the treatment or procedure; the risks, benefits, and possible complications of the intervention; the alternatives, including doing nothing; the risk(s) and benefit(s) of the alternative treatment(s) or procedure(s); and the risk(s) and benefit(s) of doing nothing.  Ms. Stacey Warner was provided with information about the general risks and possible complications associated with most interventional procedures. These include, but are not limited to: failure to achieve desired goals, infection, bleeding, organ or nerve damage, allergic reactions, paralysis, and/or death.  In addition, she was informed of those risks and possible complications associated to this particular procedure, which include, but are not limited to: damage to the implant; failure to decrease pain; local, systemic, or serious CNS infections, intraspinal abscess with possible cord compression and paralysis, or life-threatening such as meningitis; intrathecal and/or epidural bleeding with formation of hematoma with possible spinal cord compression and permanent  paralysis; organ damage; nerve injury or damage with subsequent sensory, motor, and/or autonomic system dysfunction, resulting in transient or permanent pain, numbness, and/or weakness of  one or several areas of the body; allergic reactions, either minor or major life-threatening, such as anaphylactic or anaphylactoid reactions.  Furthermore, Ms. Stacey Warner was informed of those risks and complications associated with the medications. These include, but are not limited to: allergic reactions (i.e.: anaphylactic or anaphylactoid reactions); arrhythmia;  Hypotension/hypertension; cardiovascular collapse; respiratory depression and/or shortness of breath; swelling or edema; medication-induced neural toxicity; particulate matter embolism and blood vessel occlusion with resultant organ, and/or nervous system infarction and permanent paralysis.  Finally, she was informed that Medicine is not an exact science; therefore, there is also the possibility of unforeseen or unpredictable risks and/or possible complications that may result in a catastrophic outcome. The patient indicated having understood very clearly. We have given the patient no guarantees and we have made no promises. Enough time was given to the patient to ask questions, all of which were answered to the patient's satisfaction. Ms. Stacey Warner has indicated that she wanted to continue with the procedure. Attestation: I, the ordering provider, attest that I have discussed with the patient the benefits, risks, side-effects, alternatives, likelihood of achieving goals, and potential problems during recovery for the procedure that I have provided informed consent. Date  Time: 12/03/2021  7:59 AM  Pre-Procedure Preparation:  Monitoring: As per clinic protocol. Respiration, ETCO2, SpO2, BP, heart rate and rhythm monitor placed and checked for adequate function Safety Precautions: Patient was assessed for positional comfort and pressure points before starting the  procedure. Time-out: I initiated and conducted the "Time-out" before starting the procedure, as per protocol. The patient was asked to participate by confirming the accuracy of the "Time Out" information. Verification of the correct person, site, and procedure were performed and confirmed by me, the nursing staff, and the patient. "Time-out" conducted as per Joint Commission's Universal Protocol (UP.01.01.01). Time: 0843  Description of Procedure Process:   Position: Prone Target Area: <1 cm from targeted nerve (Medial Branch Nerve) Approach: Posterior percutaneous, paramedial, interlaminar approach Area Prepped: Entire Lumbosacral Region Prepping solution: ChloraPrep (2% chlorhexidine gluconate and 70% isopropyl alcohol) Safety Precautions: Safe injection practices and needle disposal techniques used. Medications properly checked for expiration dates. SDV (single dose vial) medications used. Aspiration looking for blood return and/or CSF was conducted prior to all injections. At no point did I inject any substances, as a needle was being advanced. No attempts were made at seeking any paresthesias.  Description of the Procedure (Lumbar Medial Branch):  Availability of a responsible, adult driver, and NPO status confirmed. Informed consent was obtained after having discussed risks and possible complications. An IV was started. The patient was then taken to the fluoroscopy suite, where the patient was placed in position for the procedure, over the fluoroscopy table. The patient was then monitored in the usual manner. Fluoroscopy was manipulated to obtain the best possible view of the target. Parallex error was corrected before commencing the procedure. Once a clear view of the target had been obtained, the skin and subcutaneous tissue over the procedure site were infiltrated using lidocaine, loaded in a 10 cc luer-loc syringe with a 0.5 inch, 25-G needle. Care was taken to avoid numbing deeper tissues  The introducer needle(s) was/were then inserted through the skin and deeper tissues, specifically the multifidus muscle.  An introducer needle and stimulating probe were  assembled, inserted and advanced along the intended course of the medial branch nerve as it traverses the lamina medial and inferior to the zygapophyseal joint, taking care to maintain the proper depth of insertion as the introducer  is advanced under fluoroscopic. The introducer needle was delivered to a location in proximity to the nerve. Multiple stimulation parameters were used to deliver stimulation to the medial branch nerve in concert with stimulating at multiple positions around the nerve. Nerve target acquisition was confirmed noting generation of paresthesias in the paravertebral regions corresponding to the level being stimulated as well as rhythmic thumping within the multifidi, the latter being further corroborated via palpation.  Various electrical parameter combinations were tested, and the lead location was adjusted (physically relocated) until the patient indicated paresthesia or muscle tension overlapping the distribution of the patient's typical region of pain.  The stimulating probe was removed from the introducer and a percutaneous lead was guided through the needle and delivered to a location in similar proximity to the nerve. Final  location was verified with electrical stimulation and documented with fluoroscopy & ultrasound.  The introducer needle was removed, and the exposed end of the percutaneous lead was attached to an external stimulator unit. Various electrical parameter combinations were again  tested until the patient indicated paresthesia or muscle tension  overlapping the distribution of the patient's typical region of pain.  After confirming that lead impedance was in the normal range, the external unit was detached, the needle was removed, and the lead was anchored at the skin. The lead was threaded into  the connector block and electrical continuity and desired patient response was confirmed. The connector block was attached to the external  stimulator unit.The site was covered with a sterile occlusive dressing and  a fluoroscopic and ultrasound image was taken to document final placement. The patient was observed for stability of vital signs and comfort.  Vitals:   12/03/21 0837 12/03/21 0844 12/03/21 0848 12/03/21 0852  BP: (!) 148/70 130/76 126/77 124/78  Pulse:      Resp: 13 14 12 12   Temp:      SpO2: 97% 96% 95% 92%  Weight:      Height:       Start Time: 0843 hrs. End Time: [id number] (Sprint representative here to secure device.) hrs.  Imaging Guidance (Spinal):          Type of Imaging Technique: Fluoroscopy Guidance (Spinal) and ultrasound guidance to confirm left multifidus activation after lead placement Indication(s): Assistance in needle guidance and placement for procedures requiring needle placement in or near specific anatomical locations not easily accessible without such assistance. Exposure Time: Please see nurses notes. Contrast: None used. Fluoroscopic Guidance: I was personally present during the use of fluoroscopy. "Tunnel Vision Technique" used to obtain the best possible view of the target area. Parallax error corrected before commencing the procedure. "Direction-depth-direction" technique used to introduce the needle under continuous pulsed fluoroscopy. Once target was reached, antero-posterior, oblique, and lateral fluoroscopic projection used confirm needle placement in all planes. Images permanently stored in EMR. Interpretation: No contrast injected. I personally interpreted the imaging intraoperatively. Adequate needle placement confirmed in multiple planes. Permanent images saved into the patient's record.  Antibiotic Prophylaxis:   Anti-infectives (From admission, onward)    None      Indication(s): None identified  Post-operative Assessment:   Post-procedure Vital Signs:  Pulse/HCG Rate: 60(!) 55 Temp: (!) 97.3 F (36.3 C) Resp: 12 BP: 124/78 SpO2: 92 %  Complications: No immediate post-treatment complications observed by team, or reported by patient.  Note: The patient tolerated the entire procedure well. A repeat set of vitals were taken after the procedure and the patient was kept under observation following institutional policy,  for this type of procedure. Post-procedural neurological assessment was performed, showing return to baseline, prior to discharge. The patient was provided with post-procedure discharge instructions, including a section on how to identify potential problems. Should any problems arise concerning this procedure, the patient was given instructions to immediately contact us, at any time, without hesitation. In any case, we plan to contact the patient by telephone for a follow-up status report regarding this interventional procedure.  Comments:  No additional relevant information.  Plan of Care  Orders:  Orders Placed This Encounter  Procedures   DG PAIN CLINIC C-ARM 1-60 MIN NO REPORT    Intraoperative interpretation by procedural physician at [facility].    Standing Status:   Standing    Number of Occurrences:   1    Order Specific Question:   Reason for exam:    Answer:   Assistance in needle guidance and placement for procedures requiring needle placement in or near specific anatomical locations not easily accessible without such assistance.     Medications administered: We administered lidocaine and ropivacaine (PF) 2 mg/mL (0.2%).  See the medical record for exact dosing, route, and time of administration.  Follow-up plan:   Return in about 2 weeks (around 12/17/2021) for Contra-lateral- RIGHT SPRINT PNS, in clinic NS.     Recent Visits Date Type Provider Dept  10/11/21 Office Visit Edward Jolly, MD [facility]  Showing recent visits within past 90 days and meeting  all other requirements Today's Visits Date Type Provider Dept  12/03/21 Procedure visit Edward Jolly, MD [facility]  Showing today's visits and meeting all other requirements Future Appointments Date Type Provider Dept  12/17/21 Appointment Edward Jolly, MD [facility]  Showing future appointments within next 90 days and meeting all other requirements  Disposition: Discharge home  Discharge (Date  Time): 12/03/2021; 0901 hrs.   Primary Care Physician: Marguarite Arbour, MD Location: [facility] Note by: Edward Jolly, MD Date: 12/03/2021; Time: 9:32 AM

## 2021-12-03 NOTE — Patient Instructions (Signed)

## 2021-12-04 ENCOUNTER — Telehealth: Payer: Self-pay | Admitting: *Deleted

## 2021-12-04 NOTE — Telephone Encounter (Signed)
Post procedure call;  voicemail left.  

## 2021-12-05 ENCOUNTER — Telehealth: Payer: Self-pay | Admitting: Student in an Organized Health Care Education/Training Program

## 2021-12-05 ENCOUNTER — Encounter: Payer: Self-pay | Admitting: Student in an Organized Health Care Education/Training Program

## 2021-12-05 ENCOUNTER — Ambulatory Visit
Payer: PPO | Attending: Student in an Organized Health Care Education/Training Program | Admitting: Student in an Organized Health Care Education/Training Program

## 2021-12-05 VITALS — BP 132/66 | HR 69 | Temp 97.4°F | Ht 60.0 in | Wt 140.0 lb

## 2021-12-05 DIAGNOSIS — M5136 Other intervertebral disc degeneration, lumbar region: Secondary | ICD-10-CM | POA: Diagnosis not present

## 2021-12-05 DIAGNOSIS — M47816 Spondylosis without myelopathy or radiculopathy, lumbar region: Secondary | ICD-10-CM | POA: Diagnosis not present

## 2021-12-05 NOTE — Telephone Encounter (Signed)
Insurance will not cover this again. They are already being charged for the procedure 9/11 and she is approved for the second side. They will not let us charge them again because the patient cant handle the aftercare.

## 2021-12-05 NOTE — Telephone Encounter (Signed)
Spoke with Dr. Cherylann Ratel, ok to come today for PNS removal. Patient's sister notified.

## 2021-12-05 NOTE — Telephone Encounter (Signed)
PT sister stated that she wanted the PNS removed ASAP. PT sister stated that they had no warning of the after care treatment. Sister stated that they was blind side about the after care treatement.PT sister stated that patient can't handle this after care treatment alone. Sister wanted to see if insurance will allow the PNS procedure to be redone in January, that way she will be able to assist patient. Please give patient sister a call at [phone]. Thanks

## 2021-12-05 NOTE — Progress Notes (Signed)
PROVIDER NOTE: Information contained herein reflects review and annotations entered in association with encounter. Interpretation of such information and data should be left to medically-trained personnel. Information provided to patient can be located elsewhere in the medical record under "Patient Instructions". Document created using STT-dictation technology, any transcriptional errors that may result from process are unintentional.    Patient: Stacey Warner  Service Category: E/M  Provider: Gillis Santa, MD  DOB: 11/29/1932  DOS: 12/05/2021  Referring Provider: Idelle Crouch, MD  MRN: [id number]  Specialty: Interventional Pain Management  PCP: Idelle Crouch, MD  Type: Established Patient  Setting: Ambulatory outpatient    Location: Office  Delivery: Face-to-face     HPI  Stacey Warner, a 88 y.o. year old female, is here today because of her Lumbar facet arthropathy [M47.816]. Stacey Warner primary complain today is Back Pain (lower) Last encounter: My last encounter with her was on 12/05/2021.  Pain Assessment: Severity of Chronic pain is reported as a 3 /10. Location: Back Left, Right/Denies. Onset: More than a month ago. Quality: Aching, Burning, Constant, Discomfort, Nagging, Throbbing. Timing: Constant. Modifying factor(s): Nothing. Vitals:  height is 5' (1.524 m) and weight is 140 lb (63.5 kg). Her temperature is 97.4 F (36.3 C) (abnormal). Her blood pressure is 132/66 and her pulse is 69. Her oxygen saturation is 98%.   Reason for encounter: Removal of peripheral nerve stimulator micro lead due to discomfort and unable to manage  Michelene as well as her sister state that the peripheral nerve stimulator device is too much for them to manage right now even though Stacey Warner is endorsing some pain benefit with it.  I explained to them what the peripheral nerve stimulator would entail in detail prior to the procedure.  Other than monitoring the site there is not much maintenance or  oversight that is involved with PNS.  I encouraged Latrenda to give it a couple more days however she is insistent on getting it removed.  Sprint peripheral nerve stimulator lead removed with tip intact.  Follow back up with physical medicine and rehab as the patient was specifically referred to me for a stimulator trial.   ROS  Constitutional: Denies any fever or chills Gastrointestinal: No reported hemesis, hematochezia, vomiting, or acute GI distress Musculoskeletal:  Persistent low back pain Neurological: No reported episodes of acute onset apraxia, aphasia, dysarthria, agnosia, amnesia, paralysis, loss of coordination, or loss of consciousness  Medication Review  Aspirin-Caffeine, Baclofen, Polyethylene Glycol 3350, azelastine, donepezil, dorzolamide-timolol, doxepin, famotidine, fluticasone, latanoprost, levocetirizine, magnesium chloride, memantine, montelukast, rOPINIRole, ranitidine, sertraline, tolterodine, and triamcinolone cream  History Review  Allergy: Stacey Warner is allergic to amoxicillin-pot clavulanate, ciprofloxacin, clindamycin, minocycline, sucralfate, sulfa antibiotics, and tape. Drug: Stacey Warner  reports no history of drug use. Alcohol:  reports no history of alcohol use. Tobacco:  reports that she has never smoked. She has never used smokeless tobacco. Social: Stacey Warner  reports that she has never smoked. She has never used smokeless tobacco. She reports that she does not drink alcohol and does not use drugs. Medical:  has a past medical history of Allergy, Arthritis, Cataract, GERD (gastroesophageal reflux disease), Glaucoma, Heart murmur, Myocardial infarction ([place]), Seizures ([place]), Sleep apnea, and Stroke ([place]). Surgical: Stacey Warner  has a past surgical history that includes Breast biopsy (Right). Family: family history includes Stroke in her father and mother.  Laboratory Chemistry Profile   Renal Lab Results  Component Value Date   BUN 29 (H) 10/18/2021  CREATININE 0.87 10/18/2021   GFRNONAA >60 10/18/2021    Hepatic Lab Results  Component Value Date   AST 18 10/12/2021   ALT 15 10/12/2021   ALBUMIN 3.4 (L) 10/12/2021   ALKPHOS 110 10/12/2021    Electrolytes Lab Results  Component Value Date   NA 138 10/18/2021   K 4.2 10/18/2021   CL 109 10/18/2021   CALCIUM 9.2 10/18/2021    Bone No results found for: "VD25OH", "VD125OH2TOT", "WP8099IP3", "AS5053ZJ6", "25OHVITD1", "25OHVITD2", "25OHVITD3", "TESTOFREE", "TESTOSTERONE"  Inflammation (CRP: Acute Phase) (ESR: Chronic Phase) Lab Results  Component Value Date   LATICACIDVEN 1.4 10/12/2021         Note: Above Lab results reviewed.   Physical Exam  General appearance: Well nourished, well developed, and well hydrated. In no apparent acute distress Mental status: Alert, oriented x 3 (person, place, & time)       Respiratory: No evidence of acute respiratory distress Eyes: PERLA Vitals: BP 132/66   Pulse 69   Temp (!) 97.4 F (36.3 C)   Ht 5' (1.524 m)   Wt 140 lb (63.5 kg)   SpO2 98%   BMI 27.34 kg/m  BMI: Estimated body mass index is 27.34 kg/m as calculated from the following:   Height as of this encounter: 5' (1.524 m).   Weight as of this encounter: 140 lb (63.5 kg). Ideal: Ideal body weight: 45.5 kg (100 lb 4.9 oz) Adjusted ideal body weight: 52.7 kg (116 lb 3 oz)  Sprint micro lead removed with tip intact, confirmed by Jenny Reichmann, nurse in room   Assessment   Diagnosis Status  1. Lumbar facet arthropathy   2. Lumbar spondylosis   3. Lumbar degenerative disc disease    Persistent Persistent Persistent    Plan of Care    Patient instructed to follow-up with physical medicine and rehab for continued management.  Follow-up plan:   No follow-ups on file.    Recent Visits Date Type Provider Dept  12/03/21 Procedure visit Gillis Santa, MD [facility]  10/11/21 Office Visit Gillis Santa, MD [facility]  Showing recent visits within  past 90 days and meeting all other requirements Today's Visits Date Type Provider Dept  12/05/21 Procedure visit Gillis Santa, MD [facility]  Showing today's visits and meeting all other requirements Future Appointments No visits were found meeting these conditions. Showing future appointments within next 90 days and meeting all other requirements  I discussed the assessment and treatment plan with the patient. The patient was provided an opportunity to ask questions and all were answered. The patient agreed with the plan and demonstrated an understanding of the instructions.  Patient advised to call back or seek an in-person evaluation if the symptoms or condition worsens.  Duration of encounter: 93mnutes.  Total time on encounter, as per AMA guidelines included both the face-to-face and non-face-to-face time personally spent by the physician and/or other qualified health care professional(s) on the day of the encounter (includes time in activities that require the physician or other qualified health care professional and does not include time in activities normally performed by clinical staff). Physician's time may include the following activities when performed: preparing to see the patient (eg, review of tests, pre-charting review of records) obtaining and/or reviewing separately obtained history performing a medically appropriate examination and/or evaluation counseling and educating the patient/family/caregiver ordering medications, tests, or procedures referring and communicating with other health care professionals (when not separately reported) documenting clinical information in the electronic or other  health record independently interpreting results (not separately reported) and communicating results to the patient/ family/caregiver care coordination (not separately reported)  Note by: Gillis Santa, MD Date: 12/05/2021; Time: 11:10 AM

## 2021-12-05 NOTE — Progress Notes (Signed)
Safety precautions to be maintained throughout the outpatient stay will include: orient to surroundings, keep bed in low position, maintain call bell within reach at all times, provide assistance with transfer out of bed and ambulation.  

## 2021-12-05 NOTE — Patient Instructions (Signed)
Follow up with [facility] and  Dr Yves Dill

## 2021-12-05 NOTE — Progress Notes (Signed)
After removal of PNS I went to talk with the sister that had been taking care of ;atient and she states that shewould like the PNS trial redone in January when she had 60 days for care and attend to the patient. Sister states that she Stacey Warner is unable to attend to the trial and that she would need assistance.

## 2021-12-06 ENCOUNTER — Telehealth: Payer: Self-pay | Admitting: *Deleted

## 2021-12-06 NOTE — Telephone Encounter (Signed)
Post procedure call;  sister reports that she is doing well.

## 2021-12-06 NOTE — Telephone Encounter (Signed)
Post procedure call;  spoke with sister, no concerns.

## 2021-12-12 DIAGNOSIS — H903 Sensorineural hearing loss, bilateral: Secondary | ICD-10-CM | POA: Diagnosis not present

## 2021-12-12 DIAGNOSIS — H6123 Impacted cerumen, bilateral: Secondary | ICD-10-CM | POA: Diagnosis not present

## 2021-12-13 DIAGNOSIS — H9193 Unspecified hearing loss, bilateral: Secondary | ICD-10-CM | POA: Diagnosis not present

## 2021-12-13 DIAGNOSIS — R2689 Other abnormalities of gait and mobility: Secondary | ICD-10-CM | POA: Diagnosis not present

## 2021-12-13 DIAGNOSIS — Z7189 Other specified counseling: Secondary | ICD-10-CM | POA: Diagnosis not present

## 2021-12-13 DIAGNOSIS — G309 Alzheimer's disease, unspecified: Secondary | ICD-10-CM | POA: Diagnosis not present

## 2021-12-13 DIAGNOSIS — F0280 Dementia in other diseases classified elsewhere without behavioral disturbance: Secondary | ICD-10-CM | POA: Diagnosis not present

## 2021-12-13 DIAGNOSIS — G4733 Obstructive sleep apnea (adult) (pediatric): Secondary | ICD-10-CM | POA: Diagnosis not present

## 2021-12-13 DIAGNOSIS — G2581 Restless legs syndrome: Secondary | ICD-10-CM | POA: Diagnosis not present

## 2021-12-17 ENCOUNTER — Ambulatory Visit: Payer: PPO | Admitting: Student in an Organized Health Care Education/Training Program

## 2021-12-21 DIAGNOSIS — H409 Unspecified glaucoma: Secondary | ICD-10-CM | POA: Diagnosis not present

## 2021-12-21 DIAGNOSIS — K219 Gastro-esophageal reflux disease without esophagitis: Secondary | ICD-10-CM | POA: Diagnosis not present

## 2021-12-21 DIAGNOSIS — N6019 Diffuse cystic mastopathy of unspecified breast: Secondary | ICD-10-CM | POA: Diagnosis not present

## 2021-12-21 DIAGNOSIS — Z79899 Other long term (current) drug therapy: Secondary | ICD-10-CM | POA: Diagnosis not present

## 2021-12-21 DIAGNOSIS — Z9181 History of falling: Secondary | ICD-10-CM | POA: Diagnosis not present

## 2021-12-21 DIAGNOSIS — N3281 Overactive bladder: Secondary | ICD-10-CM | POA: Diagnosis not present

## 2021-12-21 DIAGNOSIS — H9193 Unspecified hearing loss, bilateral: Secondary | ICD-10-CM | POA: Diagnosis not present

## 2021-12-21 DIAGNOSIS — R3915 Urgency of urination: Secondary | ICD-10-CM | POA: Diagnosis not present

## 2021-12-21 DIAGNOSIS — N841 Polyp of cervix uteri: Secondary | ICD-10-CM | POA: Diagnosis not present

## 2021-12-21 DIAGNOSIS — F0280 Dementia in other diseases classified elsewhere without behavioral disturbance: Secondary | ICD-10-CM | POA: Diagnosis not present

## 2021-12-21 DIAGNOSIS — G2581 Restless legs syndrome: Secondary | ICD-10-CM | POA: Diagnosis not present

## 2021-12-21 DIAGNOSIS — N951 Menopausal and female climacteric states: Secondary | ICD-10-CM | POA: Diagnosis not present

## 2021-12-21 DIAGNOSIS — Z8701 Personal history of pneumonia (recurrent): Secondary | ICD-10-CM | POA: Diagnosis not present

## 2021-12-21 DIAGNOSIS — E785 Hyperlipidemia, unspecified: Secondary | ICD-10-CM | POA: Diagnosis not present

## 2021-12-21 DIAGNOSIS — G301 Alzheimer's disease with late onset: Secondary | ICD-10-CM | POA: Diagnosis not present

## 2021-12-21 DIAGNOSIS — G4700 Insomnia, unspecified: Secondary | ICD-10-CM | POA: Diagnosis not present

## 2021-12-21 DIAGNOSIS — G4733 Obstructive sleep apnea (adult) (pediatric): Secondary | ICD-10-CM | POA: Diagnosis not present

## 2022-01-03 DIAGNOSIS — R739 Hyperglycemia, unspecified: Secondary | ICD-10-CM | POA: Diagnosis not present

## 2022-01-03 DIAGNOSIS — E7800 Pure hypercholesterolemia, unspecified: Secondary | ICD-10-CM | POA: Diagnosis not present

## 2022-01-03 DIAGNOSIS — Z23 Encounter for immunization: Secondary | ICD-10-CM | POA: Diagnosis not present

## 2022-01-03 DIAGNOSIS — R413 Other amnesia: Secondary | ICD-10-CM | POA: Diagnosis not present

## 2022-01-10 DIAGNOSIS — H6983 Other specified disorders of Eustachian tube, bilateral: Secondary | ICD-10-CM | POA: Diagnosis not present

## 2022-01-10 DIAGNOSIS — H903 Sensorineural hearing loss, bilateral: Secondary | ICD-10-CM | POA: Diagnosis not present

## 2022-01-17 DIAGNOSIS — Z9181 History of falling: Secondary | ICD-10-CM | POA: Diagnosis not present

## 2022-01-17 DIAGNOSIS — G4700 Insomnia, unspecified: Secondary | ICD-10-CM | POA: Diagnosis not present

## 2022-01-17 DIAGNOSIS — G301 Alzheimer's disease with late onset: Secondary | ICD-10-CM | POA: Diagnosis not present

## 2022-01-17 DIAGNOSIS — N3281 Overactive bladder: Secondary | ICD-10-CM | POA: Diagnosis not present

## 2022-01-17 DIAGNOSIS — R3915 Urgency of urination: Secondary | ICD-10-CM | POA: Diagnosis not present

## 2022-01-17 DIAGNOSIS — E785 Hyperlipidemia, unspecified: Secondary | ICD-10-CM | POA: Diagnosis not present

## 2022-01-17 DIAGNOSIS — N6019 Diffuse cystic mastopathy of unspecified breast: Secondary | ICD-10-CM | POA: Diagnosis not present

## 2022-01-17 DIAGNOSIS — N841 Polyp of cervix uteri: Secondary | ICD-10-CM | POA: Diagnosis not present

## 2022-01-17 DIAGNOSIS — F0280 Dementia in other diseases classified elsewhere without behavioral disturbance: Secondary | ICD-10-CM | POA: Diagnosis not present

## 2022-01-17 DIAGNOSIS — N951 Menopausal and female climacteric states: Secondary | ICD-10-CM | POA: Diagnosis not present

## 2022-01-17 DIAGNOSIS — H9193 Unspecified hearing loss, bilateral: Secondary | ICD-10-CM | POA: Diagnosis not present

## 2022-01-17 DIAGNOSIS — G2581 Restless legs syndrome: Secondary | ICD-10-CM | POA: Diagnosis not present

## 2022-01-17 DIAGNOSIS — Z8701 Personal history of pneumonia (recurrent): Secondary | ICD-10-CM | POA: Diagnosis not present

## 2022-01-17 DIAGNOSIS — K219 Gastro-esophageal reflux disease without esophagitis: Secondary | ICD-10-CM | POA: Diagnosis not present

## 2022-01-17 DIAGNOSIS — G4733 Obstructive sleep apnea (adult) (pediatric): Secondary | ICD-10-CM | POA: Diagnosis not present

## 2022-01-17 DIAGNOSIS — H409 Unspecified glaucoma: Secondary | ICD-10-CM | POA: Diagnosis not present

## 2022-01-17 DIAGNOSIS — Z79899 Other long term (current) drug therapy: Secondary | ICD-10-CM | POA: Diagnosis not present

## 2022-02-19 DIAGNOSIS — H401111 Primary open-angle glaucoma, right eye, mild stage: Secondary | ICD-10-CM | POA: Diagnosis not present

## 2022-02-25 DIAGNOSIS — H401122 Primary open-angle glaucoma, left eye, moderate stage: Secondary | ICD-10-CM | POA: Diagnosis not present

## 2022-03-12 DIAGNOSIS — G309 Alzheimer's disease, unspecified: Secondary | ICD-10-CM | POA: Diagnosis not present

## 2022-03-12 DIAGNOSIS — F0280 Dementia in other diseases classified elsewhere without behavioral disturbance: Secondary | ICD-10-CM | POA: Diagnosis not present

## 2022-03-12 DIAGNOSIS — F5101 Primary insomnia: Secondary | ICD-10-CM | POA: Diagnosis not present

## 2022-03-12 DIAGNOSIS — G2581 Restless legs syndrome: Secondary | ICD-10-CM | POA: Diagnosis not present

## 2022-03-12 DIAGNOSIS — R2689 Other abnormalities of gait and mobility: Secondary | ICD-10-CM | POA: Diagnosis not present

## 2022-03-12 DIAGNOSIS — G4733 Obstructive sleep apnea (adult) (pediatric): Secondary | ICD-10-CM | POA: Diagnosis not present

## 2022-03-12 DIAGNOSIS — H9193 Unspecified hearing loss, bilateral: Secondary | ICD-10-CM | POA: Diagnosis not present

## 2022-04-05 ENCOUNTER — Emergency Department
Admission: EM | Admit: 2022-04-05 | Discharge: 2022-04-05 | Disposition: A | Discharge status: Home / Self Care | Payer: PPO | Attending: Emergency Medicine | Admitting: Emergency Medicine

## 2022-04-05 ENCOUNTER — Emergency Department: Payer: PPO

## 2022-04-05 DIAGNOSIS — S0990XA Unspecified injury of head, initial encounter: Secondary | ICD-10-CM | POA: Diagnosis not present

## 2022-04-05 DIAGNOSIS — Z7982 Long term (current) use of aspirin: Secondary | ICD-10-CM | POA: Diagnosis not present

## 2022-04-05 DIAGNOSIS — W01198A Fall on same level from slipping, tripping and stumbling with subsequent striking against other object, initial encounter: Secondary | ICD-10-CM | POA: Diagnosis not present

## 2022-04-05 DIAGNOSIS — W19XXXA Unspecified fall, initial encounter: Secondary | ICD-10-CM

## 2022-04-05 DIAGNOSIS — I959 Hypotension, unspecified: Secondary | ICD-10-CM | POA: Diagnosis not present

## 2022-04-05 DIAGNOSIS — S0101XA Laceration without foreign body of scalp, initial encounter: Secondary | ICD-10-CM

## 2022-04-05 DIAGNOSIS — H547 Unspecified visual loss: Secondary | ICD-10-CM | POA: Diagnosis not present

## 2022-04-05 NOTE — ED Notes (Signed)
ED Provider at bedside. 

## 2022-04-05 NOTE — ED Provider Notes (Signed)
[facility] Provider Note    Event Date/Time   First MD Initiated Contact with Patient 04/05/22 [phone]     (approximate)   History   Fall   HPI  Stacey Warner is a 89 y.o. female brought to the ED via EMS from home status post mechanical fall.  Patient was ambulating in the dark, lost her balance and fell backwards, striking the back of her head on a wooden bench.  Denies LOC.  Tetanus is up-to-date.  Denies vision changes, neck pain, chest pain, shortness of breath, abdominal pain, nausea, vomiting or dizziness.  Denies anticoagulant use.     Past Medical History   Past Medical History:  Diagnosis Date   Allergy    Arthritis    Cataract    GERD (gastroesophageal reflux disease)    Glaucoma    Heart murmur    Myocardial infarction ([place])    Seizures ([place])    Sleep apnea    Stroke [facility])      Active Problem List   Patient Active Problem List   Diagnosis Date Noted   Lumbar spondylosis 10/11/2021   Lumbar degenerative disc disease 10/11/2021   Chronic bilateral low back pain without sciatica 10/11/2021   Chronic pain syndrome 10/11/2021     Past Surgical History   Past Surgical History:  Procedure Laterality Date   BREAST BIOPSY Right    CORE W/CLIP X 2 - NEG     Home Medications   Prior to Admission medications   Medication Sig Start Date End Date Taking? Authorizing Provider  Aspirin-Caffeine (ANACIN PO) Take by mouth as needed.    [provider]  azelastine (ASTELIN) 0.1 % nasal spray  08/15/17   [provider]  Baclofen 5 MG TABS TAKE 1 TABLET (5 MG TOTAL) BY MOUTH NIGHTLY AS NEEDED FOR UP TO 30 DAYS 12/27/19   [provider]  donepezil (ARICEPT) 10 MG tablet Take 10 mg by mouth at bedtime.    [provider]  dorzolamide-timolol (COSOPT) 22.3-6.8 MG/ML ophthalmic solution 1 drop daily in the afternoon. 07/18/17   [provider]  doxepin (SINEQUAN) 25 MG capsule Take 25 mg by mouth  at bedtime.    [provider]  famotidine (PEPCID) 20 MG tablet Take 20 mg by mouth 2 (two) times daily. 04/21/18 10/23/21  [provider]  fluticasone (FLONASE) 50 MCG/ACT nasal spray 1 spray 2 (two) times daily at 10 AM and 5 PM. 08/27/17   [provider]  latanoprost (XALATAN) 0.005 % ophthalmic solution at bedtime. 08/25/17   [provider]  levocetirizine (XYZAL) 5 MG tablet SMARTSIG:1 Tablet(s) By Mouth Every Evening 09/19/19   [provider]  magnesium chloride (SLOW-MAG) 64 MG TBEC SR tablet Take by mouth daily.    [provider]  memantine (NAMENDA) 5 MG tablet Take 5 mg by mouth 2 (two) times daily. 09/04/19   [provider]  montelukast (SINGULAIR) 10 MG tablet TAKE ONE TABLET DAILY EACH EVENING PRIOR TO BEDTIME 07/12/17   [provider]  Polyethylene Glycol 3350 (MIRALAX PO) Take by mouth.    [provider]  ranitidine (ZANTAC) 300 MG capsule Take by mouth. 06/17/17   [provider]  rOPINIRole (REQUIP) 1 MG tablet Take 1 tablet (1 mg total) by mouth at bedtime. 10/23/21   Minna Merritts, MD  sertraline (ZOLOFT) 50 MG tablet Take 50 mg by mouth daily. 12/27/19   [provider]  tolterodine (DETROL LA)  4 MG 24 hr capsule Take 1 capsule (4 mg total) by mouth daily. 04/23/21   Alfredo Martinez, MD  triamcinolone cream (KENALOG) 0.1 % Apply topically. 12/22/19   [provider]     Allergies  Amoxicillin-pot clavulanate, Ciprofloxacin, Clindamycin, Minocycline, Sucralfate, Sulfa antibiotics, and Tape   Family History   Family History  Problem Relation Age of Onset   Stroke Mother    Stroke Father    Breast cancer Neg Hx      Physical Exam  Triage Vital Signs: ED Triage Vitals  Enc Vitals Group     BP      Pulse      Resp      Temp      Temp src      SpO2      Weight      Height      Head Circumference      Peak Flow      Pain Score      Pain Loc      Pain  Edu?      Excl. in GC?     Updated Vital Signs: BP 130/70 (BP Location: Left Arm)   Pulse (!) 56   Temp 97.7 F (36.5 C) (Oral)   SpO2 98%    General: Awake, no distress.  CV:  RRR.  Good peripheral perfusion.  Resp:  Normal effort.  CTAB. Abd:  Nontender.  No distention.  Other:  Approximately 4 cm linear laceration posterior scalp without active bleeding.  PERRL.  EOMI.  Nose is atraumatic.  No dental malocclusion.  No cervical spine tenderness to palpation.   ED Results / Procedures / Treatments  Labs (all labs ordered are listed, but only abnormal results are displayed) Labs Reviewed - No data to display   EKG  None   RADIOLOGY I have independently visualized and interpreted patient's CT scan as well as noted the radiology interpretation:  CT head: No ICH  Official radiology report(s): CT Head Wo Contrast  Result Date: 04/05/2022 CLINICAL DATA:  89 year old female status post fall backwards, struck head on bench. EXAM: CT HEAD WITHOUT CONTRAST TECHNIQUE: Contiguous axial images were obtained from the base of the skull through the vertex without intravenous contrast. RADIATION DOSE REDUCTION: This exam was performed according to the departmental dose-optimization program which includes automated exposure control, adjustment of the mA and/or kV according to patient size and/or use of iterative reconstruction technique. COMPARISON:  Head CT 10/12/2021. FINDINGS: Brain: Stable cerebral volume. No midline shift, ventriculomegaly, mass effect, evidence of mass lesion, intracranial hemorrhage or evidence of cortically based acute infarction. Gray-white matter differentiation appears stable and within normal limits for age. Vascular: Calcified atherosclerosis at the skull base. No suspicious intracranial vascular hyperdensity. Skull: No acute osseous abnormality identified. Sinuses/Orbits: Mild chronic left sphenoid sinus periosteal thickening and bubbly opacity. Other Visualized  paranasal sinuses and mastoids are clear. Other: Posterior right vertex scalp laceration and/or hematoma series 3, image 64. No scalp soft tissue gas identified. Underlying calvarium intact. Stable orbits soft tissues. IMPRESSION: 1. Posterior right vertex scalp laceration and/or hematoma. No underlying skull fracture. 2. Stable and negative for age non contrast CT appearance of the brain. Electronically Signed   By: Odessa Fleming M.D.   On: 04/05/2022 06:10     PROCEDURES:  Critical Care performed: No  ..Laceration Repair  Date/Time: 04/05/2022 6:26 AM  Performed by: Irean Hong, MD Authorized by: Irean Hong, MD   Consent:    Consent  obtained:  Verbal   Consent given by:  Patient   Risks, benefits, and alternatives were discussed: yes     Risks discussed:  Infection, pain, need for additional repair, poor cosmetic result, nerve damage, poor wound healing and vascular damage   Alternatives discussed:  No treatment Anesthesia:    Anesthesia method:  Topical application   Topical anesthesia: PainEase. Laceration details:    Location:  Scalp   Scalp location:  Occipital   Length (cm):  4   Depth (mm):  2 Treatment:    Area cleansed with:  Saline   Amount of cleaning:  Standard   Irrigation solution:  Sterile saline   Irrigation method:  Tap Skin repair:    Repair method:  Staples   Number of staples:  6 Approximation:    Approximation:  Close Repair type:    Repair type:  Simple Post-procedure details:    Dressing:  Open (no dressing)   Procedure completion:  Tolerated well, no immediate complications    MEDICATIONS ORDERED IN ED: Medications - No data to display   IMPRESSION / MDM / [place] / ED COURSE  I reviewed the triage vital signs and the nursing notes.                             89 year old female presenting with scalp laceration status post mechanical fall.  Differential diagnosis includes but is not limited to [place], SDH, laceration, etc.  I have  personally reviewed patient's records and note a neurology office visit on 03/12/2022 for Alzheimer's disease.  Patient's presentation is most consistent with acute presentation with potential threat to life or bodily function.  Will obtain CT head.  Patient will require staple repair.  Clinical Course as of 04/05/22 0625  Fri Apr 05, 2022  2706 Patient tolerated staples well.  Updated her of negative CT head.  Strict return precautions given.  Patient verbalizes understanding and agrees with plan of care. [JS]    Clinical Course User Index [JS] Paulette Blanch, MD     FINAL CLINICAL IMPRESSION(S) / ED DIAGNOSES   Final diagnoses:  Fall, initial encounter  Laceration of scalp, initial encounter     Rx / DC Orders   ED Discharge Orders     None        Note:  This document was prepared using Dragon voice recognition software and may include unintentional dictation errors.   Paulette Blanch, MD 04/05/22 [phone]

## 2022-04-05 NOTE — Discharge Instructions (Signed)
1.  Staple removal in 7 to 10 days. °2.  Return to the ER for worsening symptoms, persistent vomiting, lethargy or other concerns. °

## 2022-04-05 NOTE — ED Triage Notes (Signed)
Pt BIB EMS for c/o a fall. Patient states she fell from a standing position backwards and possibly struck the back of her head on a wooden bench. Pt denies LOC and GCS 15. Hx of Alzheimer's.

## 2022-04-09 DIAGNOSIS — S0990XA Unspecified injury of head, initial encounter: Secondary | ICD-10-CM | POA: Diagnosis not present

## 2022-04-09 DIAGNOSIS — N1832 Chronic kidney disease, stage 3b: Secondary | ICD-10-CM | POA: Diagnosis not present

## 2022-04-09 DIAGNOSIS — F0280 Dementia in other diseases classified elsewhere without behavioral disturbance: Secondary | ICD-10-CM | POA: Diagnosis not present

## 2022-04-09 DIAGNOSIS — R7303 Prediabetes: Secondary | ICD-10-CM | POA: Diagnosis not present

## 2022-04-09 DIAGNOSIS — G309 Alzheimer's disease, unspecified: Secondary | ICD-10-CM | POA: Diagnosis not present

## 2022-04-09 DIAGNOSIS — R413 Other amnesia: Secondary | ICD-10-CM | POA: Diagnosis not present

## 2022-04-16 DIAGNOSIS — E7800 Pure hypercholesterolemia, unspecified: Secondary | ICD-10-CM | POA: Diagnosis not present

## 2022-04-16 DIAGNOSIS — R7303 Prediabetes: Secondary | ICD-10-CM | POA: Diagnosis not present

## 2022-04-16 DIAGNOSIS — G8929 Other chronic pain: Secondary | ICD-10-CM | POA: Diagnosis not present

## 2022-04-16 DIAGNOSIS — R519 Headache, unspecified: Secondary | ICD-10-CM | POA: Diagnosis not present

## 2022-04-16 DIAGNOSIS — Z79899 Other long term (current) drug therapy: Secondary | ICD-10-CM | POA: Diagnosis not present

## 2022-04-16 DIAGNOSIS — R413 Other amnesia: Secondary | ICD-10-CM | POA: Diagnosis not present

## 2022-04-16 DIAGNOSIS — Z0000 Encounter for general adult medical examination without abnormal findings: Secondary | ICD-10-CM | POA: Diagnosis not present

## 2022-04-17 DIAGNOSIS — M5136 Other intervertebral disc degeneration, lumbar region: Secondary | ICD-10-CM | POA: Diagnosis not present

## 2022-04-17 DIAGNOSIS — G4730 Sleep apnea, unspecified: Secondary | ICD-10-CM | POA: Diagnosis not present

## 2022-04-17 DIAGNOSIS — E7800 Pure hypercholesterolemia, unspecified: Secondary | ICD-10-CM | POA: Diagnosis not present

## 2022-04-17 DIAGNOSIS — M47816 Spondylosis without myelopathy or radiculopathy, lumbar region: Secondary | ICD-10-CM | POA: Diagnosis not present

## 2022-04-17 DIAGNOSIS — R7303 Prediabetes: Secondary | ICD-10-CM | POA: Diagnosis not present

## 2022-04-17 DIAGNOSIS — G309 Alzheimer's disease, unspecified: Secondary | ICD-10-CM | POA: Diagnosis not present

## 2022-04-17 DIAGNOSIS — H269 Unspecified cataract: Secondary | ICD-10-CM | POA: Diagnosis not present

## 2022-04-17 DIAGNOSIS — H9193 Unspecified hearing loss, bilateral: Secondary | ICD-10-CM | POA: Diagnosis not present

## 2022-04-17 DIAGNOSIS — I252 Old myocardial infarction: Secondary | ICD-10-CM | POA: Diagnosis not present

## 2022-04-17 DIAGNOSIS — K219 Gastro-esophageal reflux disease without esophagitis: Secondary | ICD-10-CM | POA: Diagnosis not present

## 2022-04-17 DIAGNOSIS — S0101XD Laceration without foreign body of scalp, subsequent encounter: Secondary | ICD-10-CM | POA: Diagnosis not present

## 2022-04-17 DIAGNOSIS — M1990 Unspecified osteoarthritis, unspecified site: Secondary | ICD-10-CM | POA: Diagnosis not present

## 2022-04-17 DIAGNOSIS — Z79899 Other long term (current) drug therapy: Secondary | ICD-10-CM | POA: Diagnosis not present

## 2022-04-17 DIAGNOSIS — G894 Chronic pain syndrome: Secondary | ICD-10-CM | POA: Diagnosis not present

## 2022-04-17 DIAGNOSIS — Z9181 History of falling: Secondary | ICD-10-CM | POA: Diagnosis not present

## 2022-04-17 DIAGNOSIS — F0280 Dementia in other diseases classified elsewhere without behavioral disturbance: Secondary | ICD-10-CM | POA: Diagnosis not present

## 2022-04-17 DIAGNOSIS — H409 Unspecified glaucoma: Secondary | ICD-10-CM | POA: Diagnosis not present

## 2022-04-17 DIAGNOSIS — Z8673 Personal history of transient ischemic attack (TIA), and cerebral infarction without residual deficits: Secondary | ICD-10-CM | POA: Diagnosis not present

## 2022-04-18 DIAGNOSIS — E538 Deficiency of other specified B group vitamins: Secondary | ICD-10-CM | POA: Diagnosis not present

## 2022-04-18 DIAGNOSIS — G309 Alzheimer's disease, unspecified: Secondary | ICD-10-CM | POA: Diagnosis not present

## 2022-04-18 DIAGNOSIS — R2689 Other abnormalities of gait and mobility: Secondary | ICD-10-CM | POA: Diagnosis not present

## 2022-04-18 DIAGNOSIS — G44321 Chronic post-traumatic headache, intractable: Secondary | ICD-10-CM | POA: Diagnosis not present

## 2022-04-18 DIAGNOSIS — E559 Vitamin D deficiency, unspecified: Secondary | ICD-10-CM | POA: Diagnosis not present

## 2022-04-18 DIAGNOSIS — F0280 Dementia in other diseases classified elsewhere without behavioral disturbance: Secondary | ICD-10-CM | POA: Diagnosis not present

## 2022-04-22 ENCOUNTER — Ambulatory Visit: Payer: PPO | Admitting: Urology

## 2022-04-22 VITALS — BP 147/73 | HR 70 | Ht 61.0 in | Wt 150.5 lb

## 2022-04-22 DIAGNOSIS — N3946 Mixed incontinence: Secondary | ICD-10-CM

## 2022-04-22 MED ORDER — TOLTERODINE TARTRATE ER 4 MG PO CP24: 4.0000 mg | ORAL_CAPSULE | Freq: Every day | ORAL | 3 refills | Status: DC

## 2022-04-22 NOTE — Progress Notes (Signed)
04/22/2022 1:18 PM   Stacey Warner [birth year] [id number]  Referring provider: Idelle Crouch, MD [facility] [place],   [address]  Chief Complaint  Patient presents with   Urinary Incontinence    HPI: I was consulted to assess the patient's worsening urinary incontinence over the last 12 months.  She is a partial responder to Myrbetriq 25 mg.  She describes another pill years ago that caused dry mouth.   She does not leak with coughing sneezing but has urge incontinence.  She wears 5 or 6 pads a day moderately wet.  She gets up sometimes once at night.  She voids every 1-2 hours during the day.  She does not have bedwetting   50% better on Myrbetriq.  Patient would like to try Detrol LA 4 mg in combination of the Myrbetriq and reassess in 6 weeks.  I can always stop the Myrbetriq.  She is willing to set her goals high and try to get drier.  Percutaneous tibial nerve stimulation a good option moving forward    Patient actually stopped the Myrbetriq due to dry mouth which would be less common.  She states she is happy just on Detrol.  It helps moderately so.   I will see in 1 year on Detrol 90x3   Patient has ongoing urge incontinence perhaps little bit worse or more frequent.  Still on Detrol.   Patient has worsening frequency and urge incontinence.  Send urine for culture.  Discussed percutaneous tibial nerve stimulation.      Patient doing percutaneous tibial nerve stimulation. It looks like she is already had 10 or 11 treatments but stopped it for about 2 weeks because she thought it may be causing back pain.  She has a nonspecific mid low back pain.  We talked about this were not can to get an ultrasound.     We will restart the treatments of percutaneous tibial nerve stimulation it is important she gets one this week so the clinical benefit reducing urge incontinence does not wear off.  We have to get the paperwork organized.  Urge  incontinence improved.  Based upon looking at her medication list no longer on tolterodine     she thinks he is leaking more.  The visit is similar to the last 1 where she is not sure she still doing the ankle treatments.  Renew Detrol.  Continue with percutaneous tibial nerve stimulation.  Call if urine culture is sent and positive.  Given new beta 3 agonists and if it works prescribe it and added to the [employer].  If it works [employer] we can always try to stop the Detrol.   Today Frequency stable.  Last culture negative. History is always challenging.  I think she still leaking on Detrol but overall stable.  I think the PTNS helped for a while but then she stopped it since October.  We both decided not to try to continue with it.  Clinically not infected See in 1 year on Detrol  Today Urge incontinence much improved.  Had an accident yesterday and I will send urine for culture to rule out an infection.  Clinically not infected.  Frequency stable.   PMH: No past medical history on file.   Surgical History:      Past Surgical History:  Procedure Laterality Date   BREAST BIOPSY Right      CORE W/CLIP X 2 - NEG      Home Medications:  Allergies as of 04/23/2021         Reactions    Amoxicillin-pot Clavulanate Rash    Ciprofloxacin      Other reaction(s): Unknown Pt doesn't remember    Clindamycin      Other reaction(s): Unknown    Minocycline      Other reaction(s): Unknown Pt states made her feel "funny"    Sucralfate      Other reaction(s): Other (See Comments) "can't take"    Sulfa Antibiotics Rash    Had a small area of rash about 30 years ago when she took sulfa, has not taken since.    Tape Rash            Medication List           Accurate as of April 23, 2021  1:35 PM. If you have any questions, ask your nurse or doctor.              aspirin EC 81 MG tablet Take 81 mg by mouth daily.    atorvastatin 10 MG tablet Commonly known as: LIPITOR Take 10  mg by mouth daily.    azelastine 0.1 % nasal spray Commonly known as: ASTELIN    Baclofen 5 MG Tabs TAKE 1 TABLET (5 MG TOTAL) BY MOUTH NIGHTLY AS NEEDED FOR UP TO 30 DAYS    CALCIUM + D PO Take by mouth.    Calcium Carbonate-Vitamin D 600-400 MG-UNIT tablet Take by mouth.    cyclobenzaprine 5 MG tablet Commonly known as: FLEXERIL TAKE 1 TABLET BY MOUTH EVERY DAY AT NIGHT    donepezil 5 MG tablet Commonly known as: ARICEPT TAKE 1 TABLET BY MOUTH EVERY DAY AT NIGHT    dorzolamide-timolol 22.3-6.8 MG/ML ophthalmic solution Commonly known as: COSOPT    famotidine 20 MG tablet Commonly known as: PEPCID Take by mouth.    fluticasone 50 MCG/ACT nasal spray Commonly known as: FLONASE    Gemtesa 75 MG Tabs Generic drug: Vibegron Take 75 mg by mouth daily.    latanoprost 0.005 % ophthalmic solution Commonly known as: XALATAN    levocetirizine 5 MG tablet Commonly known as: XYZAL SMARTSIG:1 Tablet(s) By Mouth Every Evening    magnesium chloride 64 MG Tbec SR tablet Commonly known as: SLOW-MAG Take by mouth.    memantine 5 MG tablet Commonly known as: NAMENDA Take 5 mg by mouth 2 (two) times daily.    MIRALAX PO Take by mouth.    montelukast 10 MG tablet Commonly known as: SINGULAIR TAKE ONE TABLET DAILY EACH EVENING PRIOR TO BEDTIME    pantoprazole 40 MG tablet Commonly known as: PROTONIX TAKE 1 TABLET BY MOUTH TWICE A DAY    ranitidine 300 MG capsule Commonly known as: ZANTAC Take by mouth.    sertraline 50 MG tablet Commonly known as: ZOLOFT Take 50 mg by mouth daily.    tolterodine 4 MG 24 hr capsule Commonly known as: Detrol LA Take 1 capsule (4 mg total) by mouth daily.    triamcinolone cream 0.1 % Commonly known as: KENALOG Apply topically.             Allergies:       Allergies  Allergen Reactions   Amoxicillin-Pot Clavulanate Rash   Ciprofloxacin        Other reaction(s): Unknown Pt doesn't remember   Clindamycin        Other  reaction(s): Unknown   Minocycline        Other reaction(s): Unknown Pt states  made her feel "funny"   Sucralfate        Other reaction(s): Other (See Comments) "can't take"   Sulfa Antibiotics Rash      Had a small area of rash about 30 years ago when she took sulfa, has not taken since.   Tape Rash      Family History:      Family History  Problem Relation Age of Onset   Breast cancer Neg Hx        Social History:  reports that she has never smoked. She has never used smokeless tobacco. She reports that she does not drink alcohol and does not use drugs.   ROS:                           Physical Exam: There were no vitals taken for this visit.  Constitutional:  Alert and oriented, No acute distress. HEENT: [place] AT, moist mucus membranes.  Trachea midline, no masses.   Laboratory Data: Recent Labs  No results found for: WBC, HGB, HCT, MCV, PLT     Recent Labs  No results found for: CREATININE     Recent Labs  No results found for: PSA     Recent Labs  No results found for: TESTOSTERONE     Recent Labs  No results found for: HGBA1C     Urinalysis Labs (Brief)          Component Value Date/Time    APPEARANCEUR Clear 10/23/2020 1017    GLUCOSEU Negative 10/23/2020 1017    BILIRUBINUR Negative 10/23/2020 1017    PROTEINUR Negative 10/23/2020 1017    NITRITE Negative 10/23/2020 1017    LEUKOCYTESUR 1+ (A) 10/23/2020 1017        Pertinent Imaging: Detrol 90x3 sent to pharmacy and I will see in 1 year   Assessment & Plan: As noted above   1. Mixed incontinence   - Urinalysis, Complete     No follow-ups on file.   Martina Sinner, MD   [facility] [address], [address] [place], [place] [address] [phone]              PMH: Past Medical History:  Diagnosis Date   Allergy    Arthritis    Cataract    GERD (gastroesophageal reflux disease)    Glaucoma    Heart murmur    Myocardial  infarction ([facility])    Seizures ([facility])    Sleep apnea    Stroke [facility])     Surgical History: Past Surgical History:  Procedure Laterality Date   BREAST BIOPSY Right    CORE W/CLIP X 2 - NEG    Home Medications:  Allergies as of 04/22/2022       Reactions   Amoxicillin-pot Clavulanate Rash   Ciprofloxacin    Other reaction(s): Unknown Pt doesn't remember   Clindamycin    Other reaction(s): Unknown   Minocycline    Other reaction(s): Unknown Pt states made her feel "funny"   Sucralfate    Other reaction(s): Other (See Comments) "can't take"   Sulfa Antibiotics Rash   Had a small area of rash about 30 years ago when she took sulfa, has not taken since.   Tape Rash        Medication List        Accurate as of April 22, 2022  1:18 PM. If you have any questions, ask your nurse or doctor.  ANACIN PO Take by mouth as needed.   azelastine 0.1 % nasal spray Commonly known as: ASTELIN   Baclofen 5 MG Tabs TAKE 1 TABLET (5 MG TOTAL) BY MOUTH NIGHTLY AS NEEDED FOR UP TO 30 DAYS   donepezil 10 MG tablet Commonly known as: ARICEPT Take 10 mg by mouth at bedtime.   dorzolamide-timolol 2-0.5 % ophthalmic solution Commonly known as: COSOPT 1 drop daily in the afternoon.   doxepin 25 MG capsule Commonly known as: SINEQUAN Take 25 mg by mouth at bedtime.   famotidine 20 MG tablet Commonly known as: PEPCID Take 20 mg by mouth 2 (two) times daily.   fluticasone 50 MCG/ACT nasal spray Commonly known as: FLONASE 1 spray 2 (two) times daily at 10 AM and 5 PM.   latanoprost 0.005 % ophthalmic solution Commonly known as: XALATAN at bedtime.   levocetirizine 5 MG tablet Commonly known as: XYZAL SMARTSIG:1 Tablet(s) By Mouth Every Evening   magnesium chloride 64 MG Tbec SR tablet Commonly known as: SLOW-MAG Take by mouth daily.   memantine 5 MG tablet Commonly known as: NAMENDA Take 5 mg by mouth 2 (two) times daily.   MIRALAX PO Take by mouth.    montelukast 10 MG tablet Commonly known as: SINGULAIR TAKE ONE TABLET DAILY EACH EVENING PRIOR TO BEDTIME   ranitidine 300 MG capsule Commonly known as: ZANTAC Take by mouth.   rOPINIRole 1 MG tablet Commonly known as: REQUIP Take 1 tablet (1 mg total) by mouth at bedtime.   sertraline 50 MG tablet Commonly known as: ZOLOFT Take 50 mg by mouth daily.   tolterodine 4 MG 24 hr capsule Commonly known as: Detrol LA Take 1 capsule (4 mg total) by mouth daily.   triamcinolone cream 0.1 % Commonly known as: KENALOG Apply topically.        Allergies:  Allergies  Allergen Reactions   Amoxicillin-Pot Clavulanate Rash   Ciprofloxacin     Other reaction(s): Unknown Pt doesn't remember   Clindamycin     Other reaction(s): Unknown   Minocycline     Other reaction(s): Unknown Pt states made her feel "funny"   Sucralfate     Other reaction(s): Other (See Comments) "can't take"   Sulfa Antibiotics Rash    Had a small area of rash about 30 years ago when she took sulfa, has not taken since.   Tape Rash    Family History: Family History  Problem Relation Age of Onset   Stroke Mother    Stroke Father    Breast cancer Neg Hx     Social History:  reports that she has never smoked. She has never used smokeless tobacco. She reports that she does not drink alcohol and does not use drugs.  ROS:                                        Physical Exam: There were no vitals taken for this visit.  Constitutional:  Alert and oriented, No acute distress. HEENT: [place] AT, moist mucus membranes.  Trachea midline, no masses.  Laboratory Data: Lab Results  Component Value Date   WBC 6.2 10/18/2021   HGB 11.4 (L) 10/18/2021   HCT 35.1 (L) 10/18/2021   MCV 93.9 10/18/2021   PLT 218 10/18/2021    Lab Results  Component Value Date   CREATININE 0.87 10/18/2021    No results found for: "PSA"  No results found for: "TESTOSTERONE"  No results found for:  "HGBA1C"  Urinalysis    Component Value Date/Time   COLORURINE STRAW (A) 10/18/2021 1518   APPEARANCEUR CLEAR (A) 10/18/2021 1518   APPEARANCEUR Clear 04/23/2021 1338   LABSPEC 1.008 10/18/2021 1518   PHURINE 6.0 10/18/2021 1518   GLUCOSEU NEGATIVE 10/18/2021 1518   HGBUR NEGATIVE 10/18/2021 1518   BILIRUBINUR NEGATIVE 10/18/2021 1518   BILIRUBINUR Negative 04/23/2021 [place] 10/18/2021 1518   PROTEINUR NEGATIVE 10/18/2021 1518   NITRITE NEGATIVE 10/18/2021 1518   LEUKOCYTESUR MODERATE (A) 10/18/2021 1518    Pertinent Imaging:   Assessment & Plan: Detrol and 90 x 3 sent to pharmacy I will see her in 1 year Call if culture positive 1. Mixed incontinence  - Urinalysis, Complete   No follow-ups on file.  Reece Packer, MD  [facility] [address], [place], [place] [address] [phone]

## 2022-04-23 LAB — URINALYSIS, COMPLETE
Bilirubin, UA: NEGATIVE
Glucose, UA: NEGATIVE
Ketones, UA: NEGATIVE
Nitrite, UA: NEGATIVE
Specific Gravity, UA: 1.025 (ref 1.005–1.030)
Urobilinogen, Ur: 1 mg/dL (ref 0.2–1.0)
pH, UA: 5.5 (ref 5.0–7.5)

## 2022-04-23 LAB — MICROSCOPIC EXAMINATION: Epithelial Cells (non renal): >10 /hpf — AB (ref 0–10)

## 2022-04-24 ENCOUNTER — Encounter: Payer: Self-pay | Admitting: Urology

## 2022-04-24 NOTE — Telephone Encounter (Signed)
Patient tried gemtesa in 2022.

## 2022-04-25 DIAGNOSIS — Z9181 History of falling: Secondary | ICD-10-CM | POA: Diagnosis not present

## 2022-04-25 DIAGNOSIS — H269 Unspecified cataract: Secondary | ICD-10-CM | POA: Diagnosis not present

## 2022-04-25 DIAGNOSIS — Z8673 Personal history of transient ischemic attack (TIA), and cerebral infarction without residual deficits: Secondary | ICD-10-CM | POA: Diagnosis not present

## 2022-04-25 DIAGNOSIS — G894 Chronic pain syndrome: Secondary | ICD-10-CM | POA: Diagnosis not present

## 2022-04-25 DIAGNOSIS — K219 Gastro-esophageal reflux disease without esophagitis: Secondary | ICD-10-CM | POA: Diagnosis not present

## 2022-04-25 DIAGNOSIS — M47816 Spondylosis without myelopathy or radiculopathy, lumbar region: Secondary | ICD-10-CM | POA: Diagnosis not present

## 2022-04-25 DIAGNOSIS — S0101XD Laceration without foreign body of scalp, subsequent encounter: Secondary | ICD-10-CM | POA: Diagnosis not present

## 2022-04-25 DIAGNOSIS — M1990 Unspecified osteoarthritis, unspecified site: Secondary | ICD-10-CM | POA: Diagnosis not present

## 2022-04-25 DIAGNOSIS — I252 Old myocardial infarction: Secondary | ICD-10-CM | POA: Diagnosis not present

## 2022-04-25 DIAGNOSIS — G4730 Sleep apnea, unspecified: Secondary | ICD-10-CM | POA: Diagnosis not present

## 2022-04-25 DIAGNOSIS — H409 Unspecified glaucoma: Secondary | ICD-10-CM | POA: Diagnosis not present

## 2022-04-25 DIAGNOSIS — F0280 Dementia in other diseases classified elsewhere without behavioral disturbance: Secondary | ICD-10-CM | POA: Diagnosis not present

## 2022-04-25 DIAGNOSIS — M5136 Other intervertebral disc degeneration, lumbar region: Secondary | ICD-10-CM | POA: Diagnosis not present

## 2022-04-25 DIAGNOSIS — G309 Alzheimer's disease, unspecified: Secondary | ICD-10-CM | POA: Diagnosis not present

## 2022-04-25 DIAGNOSIS — H9193 Unspecified hearing loss, bilateral: Secondary | ICD-10-CM | POA: Diagnosis not present

## 2022-04-25 LAB — CULTURE, URINE COMPREHENSIVE

## 2022-04-26 NOTE — Telephone Encounter (Signed)
Spoke with sister per DPR, appt scheduled, her sister is asking about a procedure?? I advised she will come in to discuss medications.

## 2022-04-29 ENCOUNTER — Encounter: Payer: Self-pay | Admitting: Urology

## 2022-04-29 ENCOUNTER — Ambulatory Visit: Payer: PPO | Admitting: Urology

## 2022-05-07 DIAGNOSIS — F22 Delusional disorders: Secondary | ICD-10-CM | POA: Diagnosis not present

## 2022-05-07 DIAGNOSIS — R443 Hallucinations, unspecified: Secondary | ICD-10-CM | POA: Diagnosis not present

## 2022-05-07 DIAGNOSIS — R413 Other amnesia: Secondary | ICD-10-CM | POA: Diagnosis not present

## 2022-05-10 DIAGNOSIS — Z9889 Other specified postprocedural states: Secondary | ICD-10-CM | POA: Diagnosis not present

## 2022-05-10 DIAGNOSIS — Z974 Presence of external hearing-aid: Secondary | ICD-10-CM | POA: Diagnosis not present

## 2022-05-10 DIAGNOSIS — Z7189 Other specified counseling: Secondary | ICD-10-CM | POA: Diagnosis not present

## 2022-05-10 DIAGNOSIS — H903 Sensorineural hearing loss, bilateral: Secondary | ICD-10-CM | POA: Diagnosis not present

## 2022-05-10 DIAGNOSIS — Z0110 Encounter for examination of ears and hearing without abnormal findings: Secondary | ICD-10-CM | POA: Diagnosis not present

## 2022-05-16 DIAGNOSIS — H409 Unspecified glaucoma: Secondary | ICD-10-CM | POA: Diagnosis not present

## 2022-05-16 DIAGNOSIS — H269 Unspecified cataract: Secondary | ICD-10-CM | POA: Diagnosis not present

## 2022-05-16 DIAGNOSIS — S0101XD Laceration without foreign body of scalp, subsequent encounter: Secondary | ICD-10-CM | POA: Diagnosis not present

## 2022-05-16 DIAGNOSIS — M1990 Unspecified osteoarthritis, unspecified site: Secondary | ICD-10-CM | POA: Diagnosis not present

## 2022-05-16 DIAGNOSIS — M5136 Other intervertebral disc degeneration, lumbar region: Secondary | ICD-10-CM | POA: Diagnosis not present

## 2022-05-16 DIAGNOSIS — Z9181 History of falling: Secondary | ICD-10-CM | POA: Diagnosis not present

## 2022-05-16 DIAGNOSIS — M47816 Spondylosis without myelopathy or radiculopathy, lumbar region: Secondary | ICD-10-CM | POA: Diagnosis not present

## 2022-05-16 DIAGNOSIS — I252 Old myocardial infarction: Secondary | ICD-10-CM | POA: Diagnosis not present

## 2022-05-16 DIAGNOSIS — F0280 Dementia in other diseases classified elsewhere without behavioral disturbance: Secondary | ICD-10-CM | POA: Diagnosis not present

## 2022-05-16 DIAGNOSIS — G309 Alzheimer's disease, unspecified: Secondary | ICD-10-CM | POA: Diagnosis not present

## 2022-05-16 DIAGNOSIS — K219 Gastro-esophageal reflux disease without esophagitis: Secondary | ICD-10-CM | POA: Diagnosis not present

## 2022-05-16 DIAGNOSIS — G894 Chronic pain syndrome: Secondary | ICD-10-CM | POA: Diagnosis not present

## 2022-05-16 DIAGNOSIS — Z8673 Personal history of transient ischemic attack (TIA), and cerebral infarction without residual deficits: Secondary | ICD-10-CM | POA: Diagnosis not present

## 2022-05-16 DIAGNOSIS — G4730 Sleep apnea, unspecified: Secondary | ICD-10-CM | POA: Diagnosis not present

## 2022-05-16 DIAGNOSIS — H9193 Unspecified hearing loss, bilateral: Secondary | ICD-10-CM | POA: Diagnosis not present

## 2022-05-24 DIAGNOSIS — G2581 Restless legs syndrome: Secondary | ICD-10-CM | POA: Diagnosis not present

## 2022-05-24 DIAGNOSIS — G4733 Obstructive sleep apnea (adult) (pediatric): Secondary | ICD-10-CM | POA: Diagnosis not present

## 2022-05-24 DIAGNOSIS — G309 Alzheimer's disease, unspecified: Secondary | ICD-10-CM | POA: Diagnosis not present

## 2022-05-24 DIAGNOSIS — F0280 Dementia in other diseases classified elsewhere without behavioral disturbance: Secondary | ICD-10-CM | POA: Diagnosis not present

## 2022-05-24 DIAGNOSIS — E559 Vitamin D deficiency, unspecified: Secondary | ICD-10-CM | POA: Diagnosis not present

## 2022-05-24 DIAGNOSIS — G44321 Chronic post-traumatic headache, intractable: Secondary | ICD-10-CM | POA: Diagnosis not present

## 2022-05-28 DIAGNOSIS — F0280 Dementia in other diseases classified elsewhere without behavioral disturbance: Secondary | ICD-10-CM | POA: Diagnosis not present

## 2022-05-28 DIAGNOSIS — R519 Headache, unspecified: Secondary | ICD-10-CM | POA: Diagnosis not present

## 2022-05-28 DIAGNOSIS — Z79899 Other long term (current) drug therapy: Secondary | ICD-10-CM | POA: Diagnosis not present

## 2022-05-28 DIAGNOSIS — G8929 Other chronic pain: Secondary | ICD-10-CM | POA: Diagnosis not present

## 2022-05-28 DIAGNOSIS — R270 Ataxia, unspecified: Secondary | ICD-10-CM | POA: Diagnosis not present

## 2022-05-28 DIAGNOSIS — G309 Alzheimer's disease, unspecified: Secondary | ICD-10-CM | POA: Diagnosis not present

## 2022-05-28 DIAGNOSIS — E782 Mixed hyperlipidemia: Secondary | ICD-10-CM | POA: Diagnosis not present

## 2022-05-28 DIAGNOSIS — R7303 Prediabetes: Secondary | ICD-10-CM | POA: Diagnosis not present

## 2022-05-28 DIAGNOSIS — R413 Other amnesia: Secondary | ICD-10-CM | POA: Diagnosis not present

## 2022-05-29 DIAGNOSIS — G894 Chronic pain syndrome: Secondary | ICD-10-CM | POA: Diagnosis not present

## 2022-05-29 DIAGNOSIS — M1990 Unspecified osteoarthritis, unspecified site: Secondary | ICD-10-CM | POA: Diagnosis not present

## 2022-05-29 DIAGNOSIS — K219 Gastro-esophageal reflux disease without esophagitis: Secondary | ICD-10-CM | POA: Diagnosis not present

## 2022-05-29 DIAGNOSIS — H9193 Unspecified hearing loss, bilateral: Secondary | ICD-10-CM | POA: Diagnosis not present

## 2022-05-29 DIAGNOSIS — F0280 Dementia in other diseases classified elsewhere without behavioral disturbance: Secondary | ICD-10-CM | POA: Diagnosis not present

## 2022-05-29 DIAGNOSIS — Z9181 History of falling: Secondary | ICD-10-CM | POA: Diagnosis not present

## 2022-05-29 DIAGNOSIS — M47816 Spondylosis without myelopathy or radiculopathy, lumbar region: Secondary | ICD-10-CM | POA: Diagnosis not present

## 2022-05-29 DIAGNOSIS — M5136 Other intervertebral disc degeneration, lumbar region: Secondary | ICD-10-CM | POA: Diagnosis not present

## 2022-05-29 DIAGNOSIS — G309 Alzheimer's disease, unspecified: Secondary | ICD-10-CM | POA: Diagnosis not present

## 2022-05-29 DIAGNOSIS — I252 Old myocardial infarction: Secondary | ICD-10-CM | POA: Diagnosis not present

## 2022-05-29 DIAGNOSIS — S0101XD Laceration without foreign body of scalp, subsequent encounter: Secondary | ICD-10-CM | POA: Diagnosis not present

## 2022-05-29 DIAGNOSIS — H409 Unspecified glaucoma: Secondary | ICD-10-CM | POA: Diagnosis not present

## 2022-05-29 DIAGNOSIS — G4730 Sleep apnea, unspecified: Secondary | ICD-10-CM | POA: Diagnosis not present

## 2022-05-29 DIAGNOSIS — H269 Unspecified cataract: Secondary | ICD-10-CM | POA: Diagnosis not present

## 2022-05-29 DIAGNOSIS — Z8673 Personal history of transient ischemic attack (TIA), and cerebral infarction without residual deficits: Secondary | ICD-10-CM | POA: Diagnosis not present

## 2022-06-04 DIAGNOSIS — Z8673 Personal history of transient ischemic attack (TIA), and cerebral infarction without residual deficits: Secondary | ICD-10-CM | POA: Diagnosis not present

## 2022-06-04 DIAGNOSIS — Z9181 History of falling: Secondary | ICD-10-CM | POA: Diagnosis not present

## 2022-06-04 DIAGNOSIS — G894 Chronic pain syndrome: Secondary | ICD-10-CM | POA: Diagnosis not present

## 2022-06-04 DIAGNOSIS — K219 Gastro-esophageal reflux disease without esophagitis: Secondary | ICD-10-CM | POA: Diagnosis not present

## 2022-06-04 DIAGNOSIS — H269 Unspecified cataract: Secondary | ICD-10-CM | POA: Diagnosis not present

## 2022-06-04 DIAGNOSIS — H409 Unspecified glaucoma: Secondary | ICD-10-CM | POA: Diagnosis not present

## 2022-06-04 DIAGNOSIS — G309 Alzheimer's disease, unspecified: Secondary | ICD-10-CM | POA: Diagnosis not present

## 2022-06-04 DIAGNOSIS — M47816 Spondylosis without myelopathy or radiculopathy, lumbar region: Secondary | ICD-10-CM | POA: Diagnosis not present

## 2022-06-04 DIAGNOSIS — F0280 Dementia in other diseases classified elsewhere without behavioral disturbance: Secondary | ICD-10-CM | POA: Diagnosis not present

## 2022-06-04 DIAGNOSIS — M5136 Other intervertebral disc degeneration, lumbar region: Secondary | ICD-10-CM | POA: Diagnosis not present

## 2022-06-04 DIAGNOSIS — G4730 Sleep apnea, unspecified: Secondary | ICD-10-CM | POA: Diagnosis not present

## 2022-06-04 DIAGNOSIS — S0101XD Laceration without foreign body of scalp, subsequent encounter: Secondary | ICD-10-CM | POA: Diagnosis not present

## 2022-06-04 DIAGNOSIS — H9193 Unspecified hearing loss, bilateral: Secondary | ICD-10-CM | POA: Diagnosis not present

## 2022-06-04 DIAGNOSIS — I252 Old myocardial infarction: Secondary | ICD-10-CM | POA: Diagnosis not present

## 2022-06-04 DIAGNOSIS — M1990 Unspecified osteoarthritis, unspecified site: Secondary | ICD-10-CM | POA: Diagnosis not present

## 2022-06-11 DIAGNOSIS — G4730 Sleep apnea, unspecified: Secondary | ICD-10-CM | POA: Diagnosis not present

## 2022-06-11 DIAGNOSIS — S0101XD Laceration without foreign body of scalp, subsequent encounter: Secondary | ICD-10-CM | POA: Diagnosis not present

## 2022-06-11 DIAGNOSIS — M47816 Spondylosis without myelopathy or radiculopathy, lumbar region: Secondary | ICD-10-CM | POA: Diagnosis not present

## 2022-06-11 DIAGNOSIS — H409 Unspecified glaucoma: Secondary | ICD-10-CM | POA: Diagnosis not present

## 2022-06-11 DIAGNOSIS — K219 Gastro-esophageal reflux disease without esophagitis: Secondary | ICD-10-CM | POA: Diagnosis not present

## 2022-06-11 DIAGNOSIS — G894 Chronic pain syndrome: Secondary | ICD-10-CM | POA: Diagnosis not present

## 2022-06-11 DIAGNOSIS — G309 Alzheimer's disease, unspecified: Secondary | ICD-10-CM | POA: Diagnosis not present

## 2022-06-11 DIAGNOSIS — F0280 Dementia in other diseases classified elsewhere without behavioral disturbance: Secondary | ICD-10-CM | POA: Diagnosis not present

## 2022-06-11 DIAGNOSIS — M5136 Other intervertebral disc degeneration, lumbar region: Secondary | ICD-10-CM | POA: Diagnosis not present

## 2022-06-11 DIAGNOSIS — H269 Unspecified cataract: Secondary | ICD-10-CM | POA: Diagnosis not present

## 2022-06-11 DIAGNOSIS — M1990 Unspecified osteoarthritis, unspecified site: Secondary | ICD-10-CM | POA: Diagnosis not present

## 2022-06-11 DIAGNOSIS — I252 Old myocardial infarction: Secondary | ICD-10-CM | POA: Diagnosis not present

## 2022-06-11 DIAGNOSIS — Z9181 History of falling: Secondary | ICD-10-CM | POA: Diagnosis not present

## 2022-06-11 DIAGNOSIS — H9193 Unspecified hearing loss, bilateral: Secondary | ICD-10-CM | POA: Diagnosis not present

## 2022-06-11 DIAGNOSIS — Z8673 Personal history of transient ischemic attack (TIA), and cerebral infarction without residual deficits: Secondary | ICD-10-CM | POA: Diagnosis not present

## 2022-06-18 DIAGNOSIS — H10503 Unspecified blepharoconjunctivitis, bilateral: Secondary | ICD-10-CM | POA: Diagnosis not present

## 2022-07-01 DIAGNOSIS — S0101XD Laceration without foreign body of scalp, subsequent encounter: Secondary | ICD-10-CM | POA: Diagnosis not present

## 2022-07-01 DIAGNOSIS — K219 Gastro-esophageal reflux disease without esophagitis: Secondary | ICD-10-CM | POA: Diagnosis not present

## 2022-07-01 DIAGNOSIS — G4730 Sleep apnea, unspecified: Secondary | ICD-10-CM | POA: Diagnosis not present

## 2022-07-01 DIAGNOSIS — G894 Chronic pain syndrome: Secondary | ICD-10-CM | POA: Diagnosis not present

## 2022-07-01 DIAGNOSIS — M1990 Unspecified osteoarthritis, unspecified site: Secondary | ICD-10-CM | POA: Diagnosis not present

## 2022-07-01 DIAGNOSIS — I252 Old myocardial infarction: Secondary | ICD-10-CM | POA: Diagnosis not present

## 2022-07-01 DIAGNOSIS — Z8673 Personal history of transient ischemic attack (TIA), and cerebral infarction without residual deficits: Secondary | ICD-10-CM | POA: Diagnosis not present

## 2022-07-01 DIAGNOSIS — H269 Unspecified cataract: Secondary | ICD-10-CM | POA: Diagnosis not present

## 2022-07-01 DIAGNOSIS — H9193 Unspecified hearing loss, bilateral: Secondary | ICD-10-CM | POA: Diagnosis not present

## 2022-07-01 DIAGNOSIS — M5136 Other intervertebral disc degeneration, lumbar region: Secondary | ICD-10-CM | POA: Diagnosis not present

## 2022-07-01 DIAGNOSIS — M47816 Spondylosis without myelopathy or radiculopathy, lumbar region: Secondary | ICD-10-CM | POA: Diagnosis not present

## 2022-07-01 DIAGNOSIS — G309 Alzheimer's disease, unspecified: Secondary | ICD-10-CM | POA: Diagnosis not present

## 2022-07-01 DIAGNOSIS — F0280 Dementia in other diseases classified elsewhere without behavioral disturbance: Secondary | ICD-10-CM | POA: Diagnosis not present

## 2022-07-01 DIAGNOSIS — H409 Unspecified glaucoma: Secondary | ICD-10-CM | POA: Diagnosis not present

## 2022-07-01 DIAGNOSIS — Z9181 History of falling: Secondary | ICD-10-CM | POA: Diagnosis not present

## 2022-07-10 DIAGNOSIS — E782 Mixed hyperlipidemia: Secondary | ICD-10-CM | POA: Diagnosis not present

## 2022-07-10 DIAGNOSIS — R7303 Prediabetes: Secondary | ICD-10-CM | POA: Diagnosis not present

## 2022-07-10 DIAGNOSIS — G309 Alzheimer's disease, unspecified: Secondary | ICD-10-CM | POA: Diagnosis not present

## 2022-07-10 DIAGNOSIS — F0280 Dementia in other diseases classified elsewhere without behavioral disturbance: Secondary | ICD-10-CM | POA: Diagnosis not present

## 2022-07-10 DIAGNOSIS — N1832 Chronic kidney disease, stage 3b: Secondary | ICD-10-CM | POA: Diagnosis not present

## 2022-07-10 DIAGNOSIS — G301 Alzheimer's disease with late onset: Secondary | ICD-10-CM | POA: Diagnosis not present

## 2022-07-10 DIAGNOSIS — F02C4 Dementia in other diseases classified elsewhere, severe, with anxiety: Secondary | ICD-10-CM | POA: Diagnosis not present

## 2022-07-17 DIAGNOSIS — G309 Alzheimer's disease, unspecified: Secondary | ICD-10-CM | POA: Diagnosis not present

## 2022-07-17 DIAGNOSIS — M1990 Unspecified osteoarthritis, unspecified site: Secondary | ICD-10-CM | POA: Diagnosis not present

## 2022-07-17 DIAGNOSIS — H269 Unspecified cataract: Secondary | ICD-10-CM | POA: Diagnosis not present

## 2022-07-17 DIAGNOSIS — Z8673 Personal history of transient ischemic attack (TIA), and cerebral infarction without residual deficits: Secondary | ICD-10-CM | POA: Diagnosis not present

## 2022-07-17 DIAGNOSIS — G4730 Sleep apnea, unspecified: Secondary | ICD-10-CM | POA: Diagnosis not present

## 2022-07-17 DIAGNOSIS — H409 Unspecified glaucoma: Secondary | ICD-10-CM | POA: Diagnosis not present

## 2022-07-17 DIAGNOSIS — F0280 Dementia in other diseases classified elsewhere without behavioral disturbance: Secondary | ICD-10-CM | POA: Diagnosis not present

## 2022-07-17 DIAGNOSIS — K219 Gastro-esophageal reflux disease without esophagitis: Secondary | ICD-10-CM | POA: Diagnosis not present

## 2022-07-17 DIAGNOSIS — I252 Old myocardial infarction: Secondary | ICD-10-CM | POA: Diagnosis not present

## 2022-07-17 DIAGNOSIS — G894 Chronic pain syndrome: Secondary | ICD-10-CM | POA: Diagnosis not present

## 2022-07-17 DIAGNOSIS — Z9181 History of falling: Secondary | ICD-10-CM | POA: Diagnosis not present

## 2022-07-17 DIAGNOSIS — S0101XD Laceration without foreign body of scalp, subsequent encounter: Secondary | ICD-10-CM | POA: Diagnosis not present

## 2022-07-17 DIAGNOSIS — M5136 Other intervertebral disc degeneration, lumbar region: Secondary | ICD-10-CM | POA: Diagnosis not present

## 2022-07-17 DIAGNOSIS — H9193 Unspecified hearing loss, bilateral: Secondary | ICD-10-CM | POA: Diagnosis not present

## 2022-07-17 DIAGNOSIS — M47816 Spondylosis without myelopathy or radiculopathy, lumbar region: Secondary | ICD-10-CM | POA: Diagnosis not present

## 2022-07-18 DIAGNOSIS — G894 Chronic pain syndrome: Secondary | ICD-10-CM | POA: Diagnosis not present

## 2022-07-18 DIAGNOSIS — F0280 Dementia in other diseases classified elsewhere without behavioral disturbance: Secondary | ICD-10-CM | POA: Diagnosis not present

## 2022-07-18 DIAGNOSIS — G4730 Sleep apnea, unspecified: Secondary | ICD-10-CM | POA: Diagnosis not present

## 2022-07-18 DIAGNOSIS — H9193 Unspecified hearing loss, bilateral: Secondary | ICD-10-CM | POA: Diagnosis not present

## 2022-07-18 DIAGNOSIS — H409 Unspecified glaucoma: Secondary | ICD-10-CM | POA: Diagnosis not present

## 2022-07-18 DIAGNOSIS — K219 Gastro-esophageal reflux disease without esophagitis: Secondary | ICD-10-CM | POA: Diagnosis not present

## 2022-07-18 DIAGNOSIS — G309 Alzheimer's disease, unspecified: Secondary | ICD-10-CM | POA: Diagnosis not present

## 2022-07-18 DIAGNOSIS — M47816 Spondylosis without myelopathy or radiculopathy, lumbar region: Secondary | ICD-10-CM | POA: Diagnosis not present

## 2022-07-18 DIAGNOSIS — Z9181 History of falling: Secondary | ICD-10-CM | POA: Diagnosis not present

## 2022-07-18 DIAGNOSIS — H269 Unspecified cataract: Secondary | ICD-10-CM | POA: Diagnosis not present

## 2022-07-18 DIAGNOSIS — I252 Old myocardial infarction: Secondary | ICD-10-CM | POA: Diagnosis not present

## 2022-07-18 DIAGNOSIS — M1990 Unspecified osteoarthritis, unspecified site: Secondary | ICD-10-CM | POA: Diagnosis not present

## 2022-07-18 DIAGNOSIS — S0101XD Laceration without foreign body of scalp, subsequent encounter: Secondary | ICD-10-CM | POA: Diagnosis not present

## 2022-07-18 DIAGNOSIS — Z8673 Personal history of transient ischemic attack (TIA), and cerebral infarction without residual deficits: Secondary | ICD-10-CM | POA: Diagnosis not present

## 2022-07-18 DIAGNOSIS — M5136 Other intervertebral disc degeneration, lumbar region: Secondary | ICD-10-CM | POA: Diagnosis not present

## 2022-07-21 ENCOUNTER — Emergency Department
Admission: EM | Admit: 2022-07-21 | Discharge: 2022-07-21 | Disposition: A | Discharge status: Home / Self Care | Payer: PPO | Attending: Student in an Organized Health Care Education/Training Program | Admitting: Student in an Organized Health Care Education/Training Program

## 2022-07-21 ENCOUNTER — Encounter: Payer: Self-pay | Admitting: Emergency Medicine

## 2022-07-21 ENCOUNTER — Emergency Department: Payer: PPO

## 2022-07-21 ENCOUNTER — Other Ambulatory Visit: Payer: Self-pay

## 2022-07-21 DIAGNOSIS — Y92481 Parking lot as the place of occurrence of the external cause: Secondary | ICD-10-CM | POA: Diagnosis not present

## 2022-07-21 DIAGNOSIS — S52572A Other intraarticular fracture of lower end of left radius, initial encounter for closed fracture: Secondary | ICD-10-CM | POA: Insufficient documentation

## 2022-07-21 DIAGNOSIS — W19XXXA Unspecified fall, initial encounter: Secondary | ICD-10-CM | POA: Insufficient documentation

## 2022-07-21 DIAGNOSIS — S40011A Contusion of right shoulder, initial encounter: Secondary | ICD-10-CM | POA: Diagnosis not present

## 2022-07-21 DIAGNOSIS — S40012A Contusion of left shoulder, initial encounter: Secondary | ICD-10-CM | POA: Insufficient documentation

## 2022-07-21 DIAGNOSIS — M25511 Pain in right shoulder: Secondary | ICD-10-CM | POA: Diagnosis not present

## 2022-07-21 DIAGNOSIS — S52135A Nondisplaced fracture of neck of left radius, initial encounter for closed fracture: Secondary | ICD-10-CM | POA: Diagnosis not present

## 2022-07-21 DIAGNOSIS — S300XXA Contusion of lower back and pelvis, initial encounter: Secondary | ICD-10-CM | POA: Insufficient documentation

## 2022-07-21 DIAGNOSIS — M545 Low back pain, unspecified: Secondary | ICD-10-CM | POA: Diagnosis not present

## 2022-07-21 DIAGNOSIS — S59912A Unspecified injury of left forearm, initial encounter: Secondary | ICD-10-CM | POA: Diagnosis present

## 2022-07-21 NOTE — ED Provider Notes (Signed)
[place] EMERGENCY DEPARTMENT AT [facility] Provider Note   CSN: [id number] Arrival date & time: 07/21/22  1129     History  Chief Complaint  Patient presents with   Fall   Wrist Pain    Stacey Warner is a 89 y.o. female.  Presents to the emergency department with sister for evaluation of fall.  Fall occurred yesterday she had had a mechanical fall getting out of the car in a parking lot.  She did not hit her head, no LOC.  She was able to continue her trip out yesterday but last night she continued to complain of a lot of left wrist pain.  Patient also says she is little sore in her right shoulder and lower back, sister is requesting x-rays of these today.  She denies any groin or thigh pain  HPI     Home Medications Prior to Admission medications   Medication Sig Start Date End Date Taking? Authorizing Provider  acetaminophen (TYLENOL) 325 MG tablet Take 650 mg by mouth every 6 (six) hours as needed.    [provider]  azelastine (ASTELIN) 0.1 % nasal spray  08/15/17   [provider]  dorzolamide-timolol (COSOPT) 22.3-6.8 MG/ML ophthalmic solution 1 drop daily in the afternoon. 07/18/17   [provider]  fludrocortisone (FLORINEF) 0.1 MG tablet Take 0.1 mg by mouth daily.    [provider]  fluticasone (FLONASE) 50 MCG/ACT nasal spray 1 spray 2 (two) times daily at 10 AM and 5 PM. 08/27/17   [provider]  lamoTRIgine (LAMICTAL) 100 MG tablet Take 100 mg by mouth daily.    [provider]  latanoprost (XALATAN) 0.005 % ophthalmic solution at bedtime. 08/25/17   [provider]  memantine (NAMENDA) 5 MG tablet Take 5 mg by mouth 2 (two) times daily. 09/04/19   [provider]  montelukast (SINGULAIR) 10 MG tablet TAKE ONE TABLET DAILY EACH EVENING PRIOR TO BEDTIME 07/12/17   [provider]  omeprazole (PRILOSEC) 40 MG capsule Take 40 mg by mouth daily.    [provider]   pilocarpine (SALAGEN) 5 MG tablet Take by mouth. 1/3 daily    [provider]  sertraline (ZOLOFT) 50 MG tablet Take 50 mg by mouth daily. 12/27/19   [provider]  tolterodine (DETROL LA) 4 MG 24 hr capsule Take 1 capsule (4 mg total) by mouth daily. 04/22/22   Alfredo Martinez, MD      Allergies    Amoxicillin-pot clavulanate, Ciprofloxacin, Clindamycin, Minocycline, Sucralfate, Sulfa antibiotics, and Tape    Review of Systems   Review of Systems  Physical Exam Updated Vital Signs BP (!) 146/71 (BP Location: Right Arm)   Pulse 69   Temp 98.5 F (36.9 C) (Oral)   Resp 20   Ht 5\' 1"  (1.549 m)   Wt 64 kg   SpO2 94%   BMI 26.64 kg/m  Physical Exam Constitutional:      General: She is not in acute distress.    Appearance: She is well-developed.  HENT:     Head: Normocephalic and atraumatic.  Eyes:     General:        Right eye: No discharge.        Left eye: No discharge.     Conjunctiva/sclera: Conjunctivae normal.  Cardiovascular:     Rate and Rhythm: Normal rate.  Pulmonary:     Effort: Pulmonary effort is normal. No respiratory distress.  Abdominal:     General:  There is no distension.     Tenderness: There is no abdominal tenderness. There is no guarding.  Musculoskeletal:        General: No deformity. Normal range of motion.     Cervical back: Normal range of motion.     Comments: Minimally tender right shoulder palpation no deformity.  Normal active range of motion.  Lumbar spinous process nontender.  She is tender along the left lower lumbar spine.  No muscle spasms noted.  Left wrist tender to patient.  No warmth or redness.  Skin:    General: Skin is warm and dry.     Findings: No rash.  Neurological:     General: No focal deficit present.     Mental Status: She is alert and oriented to person, place, and time. Mental status is at baseline.     Cranial Nerves: No cranial nerve deficit.     Motor: No weakness.     Gait: Gait normal.      Deep Tendon Reflexes: Reflexes are normal and symmetric.  Psychiatric:        Behavior: Behavior normal.        Thought Content: Thought content normal.     ED Results / Procedures / Treatments   Labs (all labs ordered are listed, but only abnormal results are displayed) Labs Reviewed - No data to display  EKG None  Radiology DG Wrist Complete Left  Result Date: 07/21/2022 CLINICAL DATA:  Pain after fall EXAM: LEFT WRIST - COMPLETE 3+ VIEW COMPARISON:  None Available. FINDINGS: There is a fracture through the distal radius without displacement, extending into the radiocarpal joint. No carpal bone fracture identified. No wrist dislocation. The distal ulna is normal. Calcification is identified in the triangular cartilage. Visualized metacarpal bones are normal. IMPRESSION: 1. Nondisplaced fracture through the distal radius extending into the radiocarpal joint. 2. Calcification in the triangular cartilage raising the possibility of CPPD. Electronically Signed   By: Gerome Sam III M.D.   On: 07/21/2022 12:04    Procedures Procedures    Medications Ordered in ED Medications - No data to display  ED Course/ Medical Decision Making/ A&P                             Medical Decision Making Amount and/or Complexity of Data Reviewed Radiology: ordered.   89 year old female with fall yesterday.  Left distal radius fracture.  She is placed into a volar splint.  She is educated on splint care and follow-up with orthopedics.  She also has some right shoulder pain and lower back pain, x-rays independently reviewed by me today and negative for any acute bony abnormality.  I recommend Tylenol for pain.  She will call orthopedic office tomorrow to schedule follow-up appointment. Final Clinical Impression(s) / ED Diagnoses Final diagnoses:  Other closed intra-articular fracture of distal end of left radius, initial encounter  Contusion of right shoulder, initial encounter  Lumbar  contusion, initial encounter    Rx / DC Orders ED Discharge Orders     None         Ronnette Juniper 07/21/22 1307    Willy Eddy, MD 07/21/22 1331

## 2022-07-21 NOTE — ED Triage Notes (Signed)
Pt via POV from home. Pt c/o L wrist pain after a mechanical fall yesterday in a parking lot. Denies any head injury. Denies LOC. Bruising noted to the L wrist. Pt is A&OX4 and NAD.

## 2022-07-21 NOTE — Discharge Instructions (Addendum)
Please take Tylenol every 6 hours as needed for pain.  Keep splint clean and dry.  Call orthopedic office tomorrow to schedule follow-up appointment.  Return to the ER for any worsening symptoms or any urgent changes in your health.

## 2022-07-21 NOTE — ED Notes (Signed)
Patient taken to imaging. 

## 2022-07-23 DIAGNOSIS — M47816 Spondylosis without myelopathy or radiculopathy, lumbar region: Secondary | ICD-10-CM | POA: Diagnosis not present

## 2022-07-23 DIAGNOSIS — H269 Unspecified cataract: Secondary | ICD-10-CM | POA: Diagnosis not present

## 2022-07-23 DIAGNOSIS — G309 Alzheimer's disease, unspecified: Secondary | ICD-10-CM | POA: Diagnosis not present

## 2022-07-23 DIAGNOSIS — M5136 Other intervertebral disc degeneration, lumbar region: Secondary | ICD-10-CM | POA: Diagnosis not present

## 2022-07-23 DIAGNOSIS — F0280 Dementia in other diseases classified elsewhere without behavioral disturbance: Secondary | ICD-10-CM | POA: Diagnosis not present

## 2022-07-23 DIAGNOSIS — M1990 Unspecified osteoarthritis, unspecified site: Secondary | ICD-10-CM | POA: Diagnosis not present

## 2022-07-23 DIAGNOSIS — S0101XD Laceration without foreign body of scalp, subsequent encounter: Secondary | ICD-10-CM | POA: Diagnosis not present

## 2022-07-23 DIAGNOSIS — H9193 Unspecified hearing loss, bilateral: Secondary | ICD-10-CM | POA: Diagnosis not present

## 2022-07-23 DIAGNOSIS — Z8673 Personal history of transient ischemic attack (TIA), and cerebral infarction without residual deficits: Secondary | ICD-10-CM | POA: Diagnosis not present

## 2022-07-23 DIAGNOSIS — G4730 Sleep apnea, unspecified: Secondary | ICD-10-CM | POA: Diagnosis not present

## 2022-07-23 DIAGNOSIS — H409 Unspecified glaucoma: Secondary | ICD-10-CM | POA: Diagnosis not present

## 2022-07-23 DIAGNOSIS — G894 Chronic pain syndrome: Secondary | ICD-10-CM | POA: Diagnosis not present

## 2022-07-23 DIAGNOSIS — Z9181 History of falling: Secondary | ICD-10-CM | POA: Diagnosis not present

## 2022-07-23 DIAGNOSIS — K219 Gastro-esophageal reflux disease without esophagitis: Secondary | ICD-10-CM | POA: Diagnosis not present

## 2022-07-23 DIAGNOSIS — I252 Old myocardial infarction: Secondary | ICD-10-CM | POA: Diagnosis not present

## 2022-07-26 ENCOUNTER — Telehealth: Payer: Self-pay

## 2022-07-26 NOTE — Telephone Encounter (Signed)
Transition Care Management Unsuccessful Follow-up Telephone Call  Date of discharge and from where:  07/21/2022, [facility]  Attempts:  1st Attempt  Reason for unsuccessful TCM follow-up call:  Unable to reach patient  [facility]  [facility] Population Health Community Resource Care Guide   [email or web address]  ?? [phone]   Website: [email or web address]  [email or web address]

## 2022-07-29 ENCOUNTER — Telehealth: Payer: Self-pay

## 2022-07-29 ENCOUNTER — Encounter: Payer: Self-pay | Admitting: Urology

## 2022-07-29 DIAGNOSIS — G309 Alzheimer's disease, unspecified: Secondary | ICD-10-CM | POA: Diagnosis not present

## 2022-07-29 DIAGNOSIS — H409 Unspecified glaucoma: Secondary | ICD-10-CM | POA: Diagnosis not present

## 2022-07-29 DIAGNOSIS — M1990 Unspecified osteoarthritis, unspecified site: Secondary | ICD-10-CM | POA: Diagnosis not present

## 2022-07-29 DIAGNOSIS — R2689 Other abnormalities of gait and mobility: Secondary | ICD-10-CM | POA: Diagnosis not present

## 2022-07-29 DIAGNOSIS — I252 Old myocardial infarction: Secondary | ICD-10-CM | POA: Diagnosis not present

## 2022-07-29 DIAGNOSIS — S0101XD Laceration without foreign body of scalp, subsequent encounter: Secondary | ICD-10-CM | POA: Diagnosis not present

## 2022-07-29 DIAGNOSIS — K219 Gastro-esophageal reflux disease without esophagitis: Secondary | ICD-10-CM | POA: Diagnosis not present

## 2022-07-29 DIAGNOSIS — M5136 Other intervertebral disc degeneration, lumbar region: Secondary | ICD-10-CM | POA: Diagnosis not present

## 2022-07-29 DIAGNOSIS — G894 Chronic pain syndrome: Secondary | ICD-10-CM | POA: Diagnosis not present

## 2022-07-29 DIAGNOSIS — G4730 Sleep apnea, unspecified: Secondary | ICD-10-CM | POA: Diagnosis not present

## 2022-07-29 DIAGNOSIS — Z8673 Personal history of transient ischemic attack (TIA), and cerebral infarction without residual deficits: Secondary | ICD-10-CM | POA: Diagnosis not present

## 2022-07-29 DIAGNOSIS — H9193 Unspecified hearing loss, bilateral: Secondary | ICD-10-CM | POA: Diagnosis not present

## 2022-07-29 DIAGNOSIS — M47816 Spondylosis without myelopathy or radiculopathy, lumbar region: Secondary | ICD-10-CM | POA: Diagnosis not present

## 2022-07-29 DIAGNOSIS — H269 Unspecified cataract: Secondary | ICD-10-CM | POA: Diagnosis not present

## 2022-07-29 DIAGNOSIS — Z9181 History of falling: Secondary | ICD-10-CM | POA: Diagnosis not present

## 2022-07-29 DIAGNOSIS — F0280 Dementia in other diseases classified elsewhere without behavioral disturbance: Secondary | ICD-10-CM | POA: Diagnosis not present

## 2022-07-29 NOTE — Telephone Encounter (Signed)
Transition Care Management Follow-up Telephone Call Date of discharge and from where: 07/21/2022 [facility] How have you been since you were released from the hospital? Patient is feeling better. Any questions or concerns? No  Items Reviewed: Did the pt receive and understand the discharge instructions provided? Yes  Medications obtained and verified? Yes  Other?  Gave patient's sister information about TEFL teacher for incontinence supplies. Any new allergies since your discharge? No  Dietary orders reviewed? Yes Do you have support at home? Yes   Follow up appointments reviewed:  PCP Hospital f/u appt confirmed? No  Scheduled to see  on  @ . Specialist Hospital f/u appt confirmed? Yes  Scheduled to see Kennedy Bucker MD on [facility]. Are transportation arrangements needed? No  If their condition worsens, is the pt aware to call PCP or go to the Emergency Dept.? Yes Was the patient provided with contact information for the PCP's office or ED? Yes Was to pt encouraged to call back with questions or concerns? Yes  Lois Slagel [facility]  [facility] Population Health Community Resource Care Guide   [email or web address]  ?? [phone]   Website: [email or web address]  [email or web address]

## 2022-07-30 DIAGNOSIS — M25531 Pain in right wrist: Secondary | ICD-10-CM | POA: Diagnosis not present

## 2022-07-30 DIAGNOSIS — S62102A Fracture of unspecified carpal bone, left wrist, initial encounter for closed fracture: Secondary | ICD-10-CM | POA: Diagnosis not present

## 2022-07-30 DIAGNOSIS — M25532 Pain in left wrist: Secondary | ICD-10-CM | POA: Diagnosis not present

## 2022-07-30 NOTE — Telephone Encounter (Signed)
Spoke with sister and scheduled office visit to discuss in person.

## 2022-08-06 ENCOUNTER — Emergency Department: Payer: PPO

## 2022-08-06 ENCOUNTER — Emergency Department
Admission: EM | Admit: 2022-08-06 | Discharge: 2022-08-06 | Disposition: A | Discharge status: Home / Self Care | Payer: PPO | Attending: Emergency Medicine | Admitting: Emergency Medicine

## 2022-08-06 ENCOUNTER — Other Ambulatory Visit: Payer: Self-pay

## 2022-08-06 DIAGNOSIS — W19XXXA Unspecified fall, initial encounter: Secondary | ICD-10-CM

## 2022-08-06 DIAGNOSIS — I6782 Cerebral ischemia: Secondary | ICD-10-CM | POA: Diagnosis not present

## 2022-08-06 DIAGNOSIS — W1830XA Fall on same level, unspecified, initial encounter: Secondary | ICD-10-CM | POA: Diagnosis not present

## 2022-08-06 DIAGNOSIS — R0902 Hypoxemia: Secondary | ICD-10-CM | POA: Diagnosis not present

## 2022-08-06 DIAGNOSIS — G2581 Restless legs syndrome: Secondary | ICD-10-CM | POA: Diagnosis not present

## 2022-08-06 DIAGNOSIS — G309 Alzheimer's disease, unspecified: Secondary | ICD-10-CM | POA: Insufficient documentation

## 2022-08-06 DIAGNOSIS — E876 Hypokalemia: Secondary | ICD-10-CM | POA: Insufficient documentation

## 2022-08-06 DIAGNOSIS — M47812 Spondylosis without myelopathy or radiculopathy, cervical region: Secondary | ICD-10-CM | POA: Insufficient documentation

## 2022-08-06 DIAGNOSIS — S0990XA Unspecified injury of head, initial encounter: Secondary | ICD-10-CM | POA: Diagnosis not present

## 2022-08-06 DIAGNOSIS — G4730 Sleep apnea, unspecified: Secondary | ICD-10-CM | POA: Insufficient documentation

## 2022-08-06 DIAGNOSIS — M4312 Spondylolisthesis, cervical region: Secondary | ICD-10-CM | POA: Insufficient documentation

## 2022-08-06 DIAGNOSIS — Z1152 Encounter for screening for COVID-19: Secondary | ICD-10-CM | POA: Diagnosis not present

## 2022-08-06 LAB — CBC
HCT: 33.9 % — ABNORMAL LOW (ref 36.0–46.0)
Hemoglobin: 11.1 g/dL — ABNORMAL LOW (ref 12.0–15.0)
MCH: 29.7 pg (ref 26.0–34.0)
MCHC: 32.7 g/dL (ref 30.0–36.0)
MCV: 90.6 fL (ref 80.0–100.0)
Platelets: 232 10*3/uL (ref 150–400)
RBC: 3.74 MIL/uL — ABNORMAL LOW (ref 3.87–5.11)
RDW: 16 % — ABNORMAL HIGH (ref 11.5–15.5)
WBC: 7 10*3/uL (ref 4.0–10.5)
nRBC: 0 % (ref 0.0–0.2)

## 2022-08-06 LAB — BASIC METABOLIC PANEL WITH GFR
Anion gap: 11 (ref 5–15)
BUN: 25 mg/dL — ABNORMAL HIGH (ref 8–23)
CO2: 25 mmol/L (ref 22–32)
Calcium: 9.2 mg/dL (ref 8.9–10.3)
Chloride: 101 mmol/L (ref 98–111)
Creatinine, Ser: 0.73 mg/dL (ref 0.44–1.00)
GFR, Estimated: >60 mL/min (ref >60)
Glucose, Bld: 130 mg/dL — ABNORMAL HIGH (ref 70–99)
Potassium: 2.7 mmol/L — CL (ref 3.5–5.1)
Sodium: 137 mmol/L (ref 135–145)

## 2022-08-06 LAB — BRAIN NATRIURETIC PEPTIDE: B Natriuretic Peptide: 156.1 pg/mL — ABNORMAL HIGH (ref 0.0–100.0)

## 2022-08-06 LAB — SARS CORONAVIRUS 2 BY RT PCR: SARS Coronavirus 2 by RT PCR: NEGATIVE

## 2022-08-06 LAB — TROPONIN I (HIGH SENSITIVITY): Troponin I (High Sensitivity): 9 ng/L (ref <18)

## 2022-08-06 MED ORDER — POTASSIUM CHLORIDE 10 MEQ/100ML IV SOLN
10.0000 meq | Freq: Once | INTRAVENOUS | Status: AC
  Administered 2022-08-06: 10 meq via INTRAVENOUS
  Filled 2022-08-06: qty 100

## 2022-08-06 MED ORDER — SODIUM CHLORIDE 0.9 % IV BOLUS
1000.0000 mL | Freq: Once | INTRAVENOUS | Status: AC
  Administered 2022-08-06: 1000 mL via INTRAVENOUS

## 2022-08-06 MED ORDER — POTASSIUM CHLORIDE 20 MEQ PO PACK
40.0000 meq | PACK | Freq: Two times a day (BID) | ORAL | Status: DC
  Administered 2022-08-06: 40 meq via ORAL
  Filled 2022-08-06: qty 2

## 2022-08-06 NOTE — ED Triage Notes (Signed)
Pt to ED for fall last night, hitting back of head. No blood thinner use. Pt hx of dementia, having trouble giving details of fall. Family reports pt has falls "all the time" Pt 85% on RA, placed on 2 L [place]

## 2022-08-06 NOTE — ED Provider Notes (Signed)
[facility] Provider Note    Event Date/Time   First MD Initiated Contact with Patient 08/06/22 1041     (approximate)   History   Fall   HPI  Stacey Warner is a 89 y.o. female with a past medical history of Alzheimer disease, restless leg syndrome, sleep apnea, vitamin D deficiency who presents today for evaluation after a fall.  Patient has a history of Alzheimer's disease and is a poor historian.  She is with her sister who is the power of attorney.  Patient and her sister live together.  Patient reports that she was trying to walk around a coffee table but lost her balance and fell, and struck the back of her head.  This occurred last night.  Sister said that she put an ice pack on the area and the hematoma has resolved.  Patient reports that she has a headache, this is reports that she has a headache every day.  Patient does not feel that this is any different than usual.  She has been able to ambulate since this happened.  She denies chest pain or shortness of breath.  She denies abdominal pain, nausea, vomiting, or diarrhea.    Patient Active Problem List   Diagnosis Date Noted   Lumbar spondylosis 10/11/2021   Lumbar degenerative disc disease 10/11/2021   Chronic bilateral low back pain without sciatica 10/11/2021   Chronic pain syndrome 10/11/2021          Physical Exam   Triage Vital Signs: ED Triage Vitals  Enc Vitals Group     BP 08/06/22 1033 (!) 150/69     Pulse Rate 08/06/22 1033 69     Resp 08/06/22 1033 18     Temp 08/06/22 1038 97.6 F (36.4 C)     Temp src --      SpO2 08/06/22 1033 (!) 85 %     Weight 08/06/22 1036 160 lb (72.6 kg)     Height --      Head Circumference --      Peak Flow --      Pain Score 08/06/22 1035 7     Pain Loc --      Pain Edu? --      Excl. in GC? --     Most recent vital signs: Vitals:   08/06/22 1033 08/06/22 1038  BP: (!) 150/69   Pulse: 69   Resp: 18   Temp:  97.6 F (36.4 C)   SpO2: (!) 85% 94%    Physical Exam Vitals and nursing note reviewed.  Constitutional:      General: Awake and alert. No acute distress.    Appearance: Normal appearance. The patient is normal weight.  HENT:     Head: Normocephalic and atraumatic.     Mouth: Mucous membranes are moist.  Eyes:     General: PERRL. Normal EOMs        Right eye: No discharge.        Left eye: No discharge.     Conjunctiva/sclera: Conjunctivae normal.  Cardiovascular:     Rate and Rhythm: Normal rate and regular rhythm.     Pulses: Normal pulses.  Pulmonary:     Effort: Pulmonary effort is normal. No respiratory distress.     Breath sounds: Normal breath sounds.  Able to speak easily in complete sentences.  No wheezes or rhonchi Abdominal:     Abdomen is soft. There is no abdominal tenderness. No rebound or guarding.  No distention. Musculoskeletal:        General: No swelling. Normal range of motion.     Cervical back: Normal range of motion and neck supple.  Velcro splint noted to left wrist.  No arm swelling.  No pitting edema.  Normal radial pulse.  Normal capillary refill No lower extremity edema Skin:    General: Skin is warm and dry.     Capillary Refill: Capillary refill takes less than 2 seconds.     Findings: No rash.  Neurological:     Mental Status: The patient is awake and alert.   Neurological: GCS 15 alert and oriented x3 Normal speech, no expressive or receptive aphasia or dysarthria Cranial nerves II through XII intact Normal visual fields 5 out of 5 strength in all 4 extremities with intact sensation throughout No extremity drift Normal finger-to-nose testing, no limb or truncal ataxia    ED Results / Procedures / Treatments   Labs (all labs ordered are listed, but only abnormal results are displayed) Labs Reviewed  CBC - Abnormal; Notable for the following components:      Result Value   RBC 3.74 (*)    Hemoglobin 11.1 (*)    HCT 33.9 (*)    RDW 16.0 (*)    All  other components within normal limits  BASIC METABOLIC PANEL - Abnormal; Notable for the following components:   Potassium 2.7 (*)    Glucose, Bld 130 (*)    BUN 25 (*)    All other components within normal limits  BRAIN NATRIURETIC PEPTIDE - Abnormal; Notable for the following components:   B Natriuretic Peptide 156.1 (*)    All other components within normal limits  SARS CORONAVIRUS 2 BY RT PCR  URINALYSIS, W/ REFLEX TO CULTURE (INFECTION SUSPECTED)  TROPONIN I (HIGH SENSITIVITY)  TROPONIN I (HIGH SENSITIVITY)     EKG     RADIOLOGY I independently reviewed and interpreted imaging and agree with radiologists findings.     PROCEDURES:  Critical Care performed:   Procedures   MEDICATIONS ORDERED IN ED: Medications  potassium chloride (KLOR-CON) packet 40 mEq (40 mEq Oral Given 08/06/22 1306)  potassium chloride 10 mEq in 100 mL IVPB (0 mEq Intravenous Stopped 08/06/22 1409)  sodium chloride 0.9 % bolus 1,000 mL (0 mLs Intravenous Stopped 08/06/22 1411)     IMPRESSION / MDM / ASSESSMENT AND PLAN / ED COURSE  I reviewed the triage vital signs and the nursing notes.   Differential diagnosis includes, but is not limited to, contusion, concussion, intracranial hemorrhage, cervical spine injury, CVA, TIA, pneumonia.  I reviewed the patient's chart.  Patient was seen in the emergency department on 07/21/2022 and diagnosed with a nondisplaced distal radius fracture.  She has since followed up with Dr. Rosita Kea and plan to be treated nonoperatively.  She was placed in a removable wrist splint, and she has been removing it for hygiene purposes.  Patient is awake and alert, hemodynamically stable and afebrile.  However she had an oxygen saturation of 85% on room air on arrival to triage, was placed on 2 L of oxygen via nasal cannula.  Patient is not usually on home O2.  However, with this was rechecked upon arrival back to the room, and patient was resting comfortably at 96% on room  air.  She does not feel short of breath at all.  She did not desaturate with ambulation.  Labs obtained are overall reassuring with the exception of her potassium which was low at  2.7.  This was repleted with IV and p.o. potassium.  Her troponin is negative.  Her BNP is near her baseline.  There is no leukocytosis.  Her H&H is also at her baseline.  CT head and neck are negative for any acute findings.  Her chest x-ray does not reveal any cardiopulmonary abnormality.  Patient and her sister who is her power of attorney are reassured by these findings.  They feel comfortable with discharge home.  Patient was discharged in stable condition.  Patient's presentation is most consistent with acute complicated illness / injury requiring diagnostic workup.   Clinical Course as of 08/06/22 1457  Tue Aug 06, 2022  1251 Patient at 96% on RA [JP]    Clinical Course User Index [JP] Gedalia Mcmillon, Herb Grays, PA-C     FINAL CLINICAL IMPRESSION(S) / ED DIAGNOSES   Final diagnoses:  Fall, initial encounter  Injury of head, initial encounter  Hypokalemia     Rx / DC Orders   ED Discharge Orders     None        Note:  This document was prepared using Dragon voice recognition software and may include unintentional dictation errors.   Keturah Shavers 08/06/22 1457    Jene Every, MD 08/06/22 1510

## 2022-08-06 NOTE — ED Notes (Signed)
Potassium 2.7

## 2022-08-06 NOTE — Discharge Instructions (Addendum)
Your potassium was very low, and you were given IV and p.o. potassium to help fix this problem.  Your CT scans were normal.  Your chest x-ray was also normal.  Your oxygen was 96% on room air, I am unsure why it was low when you first came in but it does not appear to be low anymore.  Please return for any new, worsening, or change in symptoms or other concerns.  Please have your potassium rechecked by your PCP this week.

## 2022-08-08 DIAGNOSIS — S0101XD Laceration without foreign body of scalp, subsequent encounter: Secondary | ICD-10-CM | POA: Diagnosis not present

## 2022-08-08 DIAGNOSIS — H409 Unspecified glaucoma: Secondary | ICD-10-CM | POA: Diagnosis not present

## 2022-08-08 DIAGNOSIS — K219 Gastro-esophageal reflux disease without esophagitis: Secondary | ICD-10-CM | POA: Diagnosis not present

## 2022-08-08 DIAGNOSIS — M47816 Spondylosis without myelopathy or radiculopathy, lumbar region: Secondary | ICD-10-CM | POA: Diagnosis not present

## 2022-08-08 DIAGNOSIS — Z8673 Personal history of transient ischemic attack (TIA), and cerebral infarction without residual deficits: Secondary | ICD-10-CM | POA: Diagnosis not present

## 2022-08-08 DIAGNOSIS — I252 Old myocardial infarction: Secondary | ICD-10-CM | POA: Diagnosis not present

## 2022-08-08 DIAGNOSIS — G309 Alzheimer's disease, unspecified: Secondary | ICD-10-CM | POA: Diagnosis not present

## 2022-08-08 DIAGNOSIS — Z9181 History of falling: Secondary | ICD-10-CM | POA: Diagnosis not present

## 2022-08-08 DIAGNOSIS — F0280 Dementia in other diseases classified elsewhere without behavioral disturbance: Secondary | ICD-10-CM | POA: Diagnosis not present

## 2022-08-08 DIAGNOSIS — H269 Unspecified cataract: Secondary | ICD-10-CM | POA: Diagnosis not present

## 2022-08-08 DIAGNOSIS — M1990 Unspecified osteoarthritis, unspecified site: Secondary | ICD-10-CM | POA: Diagnosis not present

## 2022-08-08 DIAGNOSIS — M5136 Other intervertebral disc degeneration, lumbar region: Secondary | ICD-10-CM | POA: Diagnosis not present

## 2022-08-08 DIAGNOSIS — G4730 Sleep apnea, unspecified: Secondary | ICD-10-CM | POA: Diagnosis not present

## 2022-08-08 DIAGNOSIS — G894 Chronic pain syndrome: Secondary | ICD-10-CM | POA: Diagnosis not present

## 2022-08-08 DIAGNOSIS — H9193 Unspecified hearing loss, bilateral: Secondary | ICD-10-CM | POA: Diagnosis not present

## 2022-08-13 DIAGNOSIS — Z8673 Personal history of transient ischemic attack (TIA), and cerebral infarction without residual deficits: Secondary | ICD-10-CM | POA: Diagnosis not present

## 2022-08-13 DIAGNOSIS — G894 Chronic pain syndrome: Secondary | ICD-10-CM | POA: Diagnosis not present

## 2022-08-13 DIAGNOSIS — H269 Unspecified cataract: Secondary | ICD-10-CM | POA: Diagnosis not present

## 2022-08-13 DIAGNOSIS — M1990 Unspecified osteoarthritis, unspecified site: Secondary | ICD-10-CM | POA: Diagnosis not present

## 2022-08-13 DIAGNOSIS — Z9181 History of falling: Secondary | ICD-10-CM | POA: Diagnosis not present

## 2022-08-13 DIAGNOSIS — F0280 Dementia in other diseases classified elsewhere without behavioral disturbance: Secondary | ICD-10-CM | POA: Diagnosis not present

## 2022-08-13 DIAGNOSIS — H409 Unspecified glaucoma: Secondary | ICD-10-CM | POA: Diagnosis not present

## 2022-08-13 DIAGNOSIS — S0101XD Laceration without foreign body of scalp, subsequent encounter: Secondary | ICD-10-CM | POA: Diagnosis not present

## 2022-08-13 DIAGNOSIS — M47816 Spondylosis without myelopathy or radiculopathy, lumbar region: Secondary | ICD-10-CM | POA: Diagnosis not present

## 2022-08-13 DIAGNOSIS — H9193 Unspecified hearing loss, bilateral: Secondary | ICD-10-CM | POA: Diagnosis not present

## 2022-08-13 DIAGNOSIS — G309 Alzheimer's disease, unspecified: Secondary | ICD-10-CM | POA: Diagnosis not present

## 2022-08-13 DIAGNOSIS — K219 Gastro-esophageal reflux disease without esophagitis: Secondary | ICD-10-CM | POA: Diagnosis not present

## 2022-08-13 DIAGNOSIS — I252 Old myocardial infarction: Secondary | ICD-10-CM | POA: Diagnosis not present

## 2022-08-13 DIAGNOSIS — M5136 Other intervertebral disc degeneration, lumbar region: Secondary | ICD-10-CM | POA: Diagnosis not present

## 2022-08-13 DIAGNOSIS — G4730 Sleep apnea, unspecified: Secondary | ICD-10-CM | POA: Diagnosis not present

## 2022-08-21 DIAGNOSIS — S62102D Fracture of unspecified carpal bone, left wrist, subsequent encounter for fracture with routine healing: Secondary | ICD-10-CM | POA: Diagnosis not present

## 2022-08-27 DIAGNOSIS — R413 Other amnesia: Secondary | ICD-10-CM | POA: Diagnosis not present

## 2022-08-27 DIAGNOSIS — G309 Alzheimer's disease, unspecified: Secondary | ICD-10-CM | POA: Diagnosis not present

## 2022-08-27 DIAGNOSIS — R443 Hallucinations, unspecified: Secondary | ICD-10-CM | POA: Diagnosis not present

## 2022-08-27 DIAGNOSIS — F0280 Dementia in other diseases classified elsewhere without behavioral disturbance: Secondary | ICD-10-CM | POA: Diagnosis not present

## 2022-08-27 DIAGNOSIS — F22 Delusional disorders: Secondary | ICD-10-CM | POA: Diagnosis not present

## 2022-08-27 DIAGNOSIS — R270 Ataxia, unspecified: Secondary | ICD-10-CM | POA: Diagnosis not present

## 2022-08-27 DIAGNOSIS — R627 Adult failure to thrive: Secondary | ICD-10-CM | POA: Diagnosis not present

## 2022-08-29 ENCOUNTER — Other Ambulatory Visit: Payer: Self-pay

## 2022-08-29 DIAGNOSIS — Z9181 History of falling: Secondary | ICD-10-CM | POA: Diagnosis not present

## 2022-08-29 DIAGNOSIS — Z515 Encounter for palliative care: Secondary | ICD-10-CM

## 2022-08-29 DIAGNOSIS — R2689 Other abnormalities of gait and mobility: Secondary | ICD-10-CM | POA: Diagnosis not present

## 2022-08-30 NOTE — Progress Notes (Signed)
TELEPHONE ENCOUNTER   PC SW connected with patients sister, Boyd Kerbs, to discuss and review new palliative care referral.   Sister shared that patient declining physically and having a lot of weight loss. Per sister patient is falling a lot at home and she does not feel patient can continue to stay at home alone. Sister is interested in resources and support around placement for patient.   Plan: in home visits scheduled for 6/26 @230pm .

## 2022-09-02 ENCOUNTER — Ambulatory Visit: Payer: PPO | Admitting: Urology

## 2022-09-02 VITALS — BP 143/79 | HR 81

## 2022-09-02 DIAGNOSIS — N3946 Mixed incontinence: Secondary | ICD-10-CM | POA: Diagnosis not present

## 2022-09-02 MED ORDER — SOLIFENACIN SUCCINATE 5 MG PO TABS
5.0000 mg | ORAL_TABLET | Freq: Every day | ORAL | 11 refills | Status: AC
Start: 2022-09-02 — End: ?

## 2022-09-02 NOTE — Progress Notes (Signed)
09/02/2022 2:07 PM   Stacey Warner [birth year] [id number]  Referring provider: Marguarite Arbour, MD [address] [facility] [place],  [place] [address]  No chief complaint on file.   HPI: Reviewed note.  Mixed incontinence and predominant urge incontinence 50% better on Myrbetriq but stopped due to dry mouth.  Was doing well on Detrol monotherapy.  Discontinued percutaneous tibial nerve stimulation.  Added Gemtesa to Detrol.  I saw him in January 2024 and culture was negative  Still has urge incontinence.  She wet the bed once.  She cannot leave a urine sample.  Frequency persisting.  Still on Detrol.      PMH: Past Medical History:  Diagnosis Date   Allergy    Arthritis    Cataract    GERD (gastroesophageal reflux disease)    Glaucoma    Heart murmur    Myocardial infarction (HCC)    Seizures (HCC)    Sleep apnea    Stroke [facility])     Surgical History: Past Surgical History:  Procedure Laterality Date   BREAST BIOPSY Right    CORE W/CLIP X 2 - NEG    Home Medications:  Allergies as of 09/02/2022       Reactions   Amoxicillin-pot Clavulanate Rash   Ciprofloxacin    Other reaction(s): Unknown Pt doesn't remember   Clindamycin    Other reaction(s): Unknown   Minocycline    Other reaction(s): Unknown Pt states made her feel "funny"   Sucralfate    Other reaction(s): Other (See Comments) "can't take"   Sulfa Antibiotics Rash   Had a small area of rash about 30 years ago when she took sulfa, has not taken since.   Tape Rash        Medication List        Accurate as of September 02, 2022  2:07 PM. If you have any questions, ask your nurse or doctor.          acetaminophen 325 MG tablet Commonly known as: TYLENOL Take 650 mg by mouth every 6 (six) hours as needed.   azelastine 0.1 % nasal spray Commonly known as: ASTELIN   dorzolamide-timolol 2-0.5 % ophthalmic solution Commonly known as: COSOPT 1 drop daily in the afternoon.    fludrocortisone 0.1 MG tablet Commonly known as: FLORINEF Take 0.1 mg by mouth daily.   fluticasone 50 MCG/ACT nasal spray Commonly known as: FLONASE 1 spray 2 (two) times daily at 10 AM and 5 PM.   lamoTRIgine 100 MG tablet Commonly known as: LAMICTAL Take 100 mg by mouth daily.   latanoprost 0.005 % ophthalmic solution Commonly known as: XALATAN at bedtime.   memantine 5 MG tablet Commonly known as: NAMENDA Take 5 mg by mouth 2 (two) times daily.   montelukast 10 MG tablet Commonly known as: SINGULAIR TAKE ONE TABLET DAILY EACH EVENING PRIOR TO BEDTIME   omeprazole 40 MG capsule Commonly known as: PRILOSEC Take 40 mg by mouth daily.   pilocarpine 5 MG tablet Commonly known as: SALAGEN Take by mouth. 1/3 daily   sertraline 50 MG tablet Commonly known as: ZOLOFT Take 50 mg by mouth daily.   tolterodine 4 MG 24 hr capsule Commonly known as: Detrol LA Take 1 capsule (4 mg total) by mouth daily.        Allergies:  Allergies  Allergen Reactions   Amoxicillin-Pot Clavulanate Rash   Ciprofloxacin     Other reaction(s): Unknown Pt doesn't remember   Clindamycin  Other reaction(s): Unknown   Minocycline     Other reaction(s): Unknown Pt states made her feel "funny"   Sucralfate     Other reaction(s): Other (See Comments) "can't take"   Sulfa Antibiotics Rash    Had a small area of rash about 30 years ago when she took sulfa, has not taken since.   Tape Rash    Family History: Family History  Problem Relation Age of Onset   Stroke Mother    Stroke Father    Breast cancer Neg Hx     Social History:  reports that she has never smoked. She has never used smokeless tobacco. She reports that she does not drink alcohol and does not use drugs.  ROS:                                        Physical Exam: There were no vitals taken for this visit.  Constitutional:  Alert and oriented, No acute distress. HEENT: [place] AT, moist  mucus membranes.  Trachea midline, no masses.   Laboratory Data: Lab Results  Component Value Date   WBC 7.0 08/06/2022   HGB 11.1 (L) 08/06/2022   HCT 33.9 (L) 08/06/2022   MCV 90.6 08/06/2022   PLT 232 08/06/2022    Lab Results  Component Value Date   CREATININE 0.73 08/06/2022    No results found for: "PSA"  No results found for: "TESTOSTERONE"  No results found for: "HGBA1C"  Urinalysis    Component Value Date/Time   COLORURINE STRAW (A) 10/18/2021 1518   APPEARANCEUR Hazy (A) 04/22/2022 1318   LABSPEC 1.008 10/18/2021 1518   PHURINE 6.0 10/18/2021 1518   GLUCOSEU Negative 04/22/2022 1318   HGBUR NEGATIVE 10/18/2021 1518   BILIRUBINUR Negative 04/22/2022 1318   KETONESUR NEGATIVE 10/18/2021 1518   PROTEINUR 1+ (A) 04/22/2022 1318   PROTEINUR NEGATIVE 10/18/2021 1518   NITRITE Negative 04/22/2022 1318   NITRITE NEGATIVE 10/18/2021 1518   LEUKOCYTESUR 1+ (A) 04/22/2022 1318   LEUKOCYTESUR MODERATE (A) 10/18/2021 1518    Pertinent Imaging:   Assessment & Plan: Unfortunately were reaching the end of the treatment pathway for the patient.  She is elderly in a wheelchair and I do not think Botox is in her best interest.  This can always be discussed.  She is certainly not going to have InterStim.  Reassess on Vesicare 5 mg 30 x 11.  Get urine culture next visit.  I think we need to have reasonable treatment goals.  Hopefully the patient understands the treatment pathway  There are no diagnoses linked to this encounter.  No follow-ups on file.  Martina Sinner, MD  [facility] [address] [place], [place] [address] [phone]

## 2022-09-11 DIAGNOSIS — G309 Alzheimer's disease, unspecified: Secondary | ICD-10-CM | POA: Diagnosis not present

## 2022-09-11 DIAGNOSIS — M5136 Other intervertebral disc degeneration, lumbar region: Secondary | ICD-10-CM | POA: Diagnosis not present

## 2022-09-11 DIAGNOSIS — G4730 Sleep apnea, unspecified: Secondary | ICD-10-CM | POA: Diagnosis not present

## 2022-09-11 DIAGNOSIS — F0280 Dementia in other diseases classified elsewhere without behavioral disturbance: Secondary | ICD-10-CM | POA: Diagnosis not present

## 2022-09-11 DIAGNOSIS — S0101XD Laceration without foreign body of scalp, subsequent encounter: Secondary | ICD-10-CM | POA: Diagnosis not present

## 2022-09-11 DIAGNOSIS — M47816 Spondylosis without myelopathy or radiculopathy, lumbar region: Secondary | ICD-10-CM | POA: Diagnosis not present

## 2022-09-11 DIAGNOSIS — G894 Chronic pain syndrome: Secondary | ICD-10-CM | POA: Diagnosis not present

## 2022-09-12 ENCOUNTER — Other Ambulatory Visit
Admission: RE | Admit: 2022-09-12 | Discharge: 2022-09-12 | Disposition: A | Discharge status: Home / Self Care | Payer: PPO | Source: Ambulatory Visit | Attending: Ophthalmology | Admitting: Ophthalmology

## 2022-09-12 DIAGNOSIS — I998 Other disorder of circulatory system: Secondary | ICD-10-CM | POA: Insufficient documentation

## 2022-09-12 DIAGNOSIS — H3560 Retinal hemorrhage, unspecified eye: Secondary | ICD-10-CM | POA: Insufficient documentation

## 2022-09-12 DIAGNOSIS — H348132 Central retinal vein occlusion, bilateral, stable: Secondary | ICD-10-CM | POA: Diagnosis present

## 2022-09-12 DIAGNOSIS — H353132 Nonexudative age-related macular degeneration, bilateral, intermediate dry stage: Secondary | ICD-10-CM | POA: Diagnosis not present

## 2022-09-12 DIAGNOSIS — H401111 Primary open-angle glaucoma, right eye, mild stage: Secondary | ICD-10-CM | POA: Diagnosis not present

## 2022-09-12 DIAGNOSIS — H401122 Primary open-angle glaucoma, left eye, moderate stage: Secondary | ICD-10-CM | POA: Diagnosis not present

## 2022-09-12 LAB — CBC
HCT: 34.1 % — ABNORMAL LOW (ref 36.0–46.0)
Hemoglobin: 11.2 g/dL — ABNORMAL LOW (ref 12.0–15.0)
MCH: 30.2 pg (ref 26.0–34.0)
MCHC: 32.8 g/dL (ref 30.0–36.0)
MCV: 91.9 fL (ref 80.0–100.0)
Platelets: 219 10*3/uL (ref 150–400)
RBC: 3.71 MIL/uL — ABNORMAL LOW (ref 3.87–5.11)
RDW: 15 % (ref 11.5–15.5)
WBC: 6.2 10*3/uL (ref 4.0–10.5)
nRBC: 0 % (ref 0.0–0.2)

## 2022-09-12 LAB — PROTIME-INR
INR: 1.1 (ref 0.8–1.2)
Prothrombin Time: 14.1 seconds (ref 11.4–15.2)

## 2022-09-12 LAB — C-REACTIVE PROTEIN: CRP: 0.6 mg/dL (ref <1.0)

## 2022-09-12 LAB — SEDIMENTATION RATE: Sed Rate: 27 mm/hr (ref 0–30)

## 2022-09-16 DIAGNOSIS — M47816 Spondylosis without myelopathy or radiculopathy, lumbar region: Secondary | ICD-10-CM | POA: Diagnosis not present

## 2022-09-16 DIAGNOSIS — G4730 Sleep apnea, unspecified: Secondary | ICD-10-CM | POA: Diagnosis not present

## 2022-09-16 DIAGNOSIS — G309 Alzheimer's disease, unspecified: Secondary | ICD-10-CM | POA: Diagnosis not present

## 2022-09-16 DIAGNOSIS — S0101XD Laceration without foreign body of scalp, subsequent encounter: Secondary | ICD-10-CM | POA: Diagnosis not present

## 2022-09-16 DIAGNOSIS — G894 Chronic pain syndrome: Secondary | ICD-10-CM | POA: Diagnosis not present

## 2022-09-16 DIAGNOSIS — F0280 Dementia in other diseases classified elsewhere without behavioral disturbance: Secondary | ICD-10-CM | POA: Diagnosis not present

## 2022-09-16 DIAGNOSIS — M5136 Other intervertebral disc degeneration, lumbar region: Secondary | ICD-10-CM | POA: Diagnosis not present

## 2022-09-18 ENCOUNTER — Other Ambulatory Visit: Payer: Self-pay

## 2022-09-20 DIAGNOSIS — Z79899 Other long term (current) drug therapy: Secondary | ICD-10-CM | POA: Diagnosis not present

## 2022-09-20 DIAGNOSIS — E782 Mixed hyperlipidemia: Secondary | ICD-10-CM | POA: Diagnosis not present

## 2022-09-20 DIAGNOSIS — G309 Alzheimer's disease, unspecified: Secondary | ICD-10-CM | POA: Diagnosis not present

## 2022-09-20 DIAGNOSIS — N3281 Overactive bladder: Secondary | ICD-10-CM | POA: Diagnosis not present

## 2022-09-20 DIAGNOSIS — F02C4 Dementia in other diseases classified elsewhere, severe, with anxiety: Secondary | ICD-10-CM | POA: Diagnosis not present

## 2022-09-20 DIAGNOSIS — G301 Alzheimer's disease with late onset: Secondary | ICD-10-CM | POA: Diagnosis not present

## 2022-09-20 DIAGNOSIS — Z111 Encounter for screening for respiratory tuberculosis: Secondary | ICD-10-CM | POA: Diagnosis not present

## 2022-09-20 DIAGNOSIS — F0280 Dementia in other diseases classified elsewhere without behavioral disturbance: Secondary | ICD-10-CM | POA: Diagnosis not present

## 2022-09-20 DIAGNOSIS — R7303 Prediabetes: Secondary | ICD-10-CM | POA: Diagnosis not present

## 2022-09-20 NOTE — Progress Notes (Signed)
 Stacey Warner is a  89 y.o. female who presents for  CHIEF COMPLAINT Chief Complaint  Patient presents with  . Follow-up  . Dementia  . Hyperlipidemia  . prediabetes    Subjective: History of Present Illness  Pt in NAD. BP stable. Has severe dementia, moving to a memory care facility. Has HLD on statin and prediabetes not on meds. Also with OAB on meds. Very confused. Does note a facial rash. No fever. Denies Cp or SOB. Not eating or sleeping well. No change in bowels or bladder.    Past Medical History:  Diagnosis Date  . Cervical polyp   . Diverticulosis   . Fibrocystic breast disease   . Gastroesophageal reflux disease 02/25/2011  . Glaucoma (increased eye pressure) 02/25/2011  . Hay fever 02/25/2011  . 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 History of rheumatic fever    childhood  . Hyperlipidemia   . Menopausal syndrome   . Overactive bladder 02/25/2011  . Pneumonia 1990   Mild, treated with outpatient antibiotics  . Recurrent boils 02/25/2011  . Sleep apnea    Patient Active Problem List  Diagnosis  . Recurrent boils  . Hay fever  . Gastroesophageal reflux disease  . Glaucoma (increased eye pressure)  . Overactive bladder  . Hyperlipidemia  . Environmental allergies  . IBS (irritable bowel syndrome)  . Hyperglycemia  . Tardive dyskinesia  . Cervical polyp  . History of rheumatic fever  . Fibrocystic breast disease  . Diverticulosis  . Menopausal syndrome  . OSA (obstructive sleep apnea)  . SOBOE (shortness of breath on exertion)  . Memory disturbance  . Prediabetes  . 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 Lumbar spondylosis  . Chronic pain syndrome  . Chronic bilateral low back pain without sciatica    Past Surgical History:  Procedure Laterality Date  . MIDDLE EAR SURGERY  2009   For bone that had deteriorated--? stapidectomy  . COLONOSCOPY  05/06/2014   Entire examined colon is normal/No Repeat/PYO  .  COLONOSCOPY  01/22/2012, 01/25/2003   Int hemorrhoids, nonthrombosed - normal, no repeat  . EGD  08/02/2013 PYO   ESOPHAGITIS/NO REPEAT/OH.MARILU LATHER  . EGD  08/02/2013, 01/22/2012, 04/21/2001, 05/24/1993   Esophagitis, no repeat  . right foot surgery    . SIGMOIDOSCOPY  10/16/1998, 05/23/1993   Sigmoid diverticulosis, reflux symptoms  . TONSILLECTOMY  As child     Current Outpatient Medications:  .  acetaminophen  (TYLENOL ) 325 MG tablet, Take 650 mg by mouth 2 (two) times daily as needed for Pain, Disp: , Rfl:  .  atorvastatin (LIPITOR) 10 MG tablet, TAKE 1 TABLET BY MOUTH EVERY DAY, Disp: 90 tablet, Rfl: 1 .  azelastine (ASTELIN) 137 mcg nasal spray, Place 1 spray into both nostrils 2 (two) times daily., Disp: , Rfl:  .  butalbital-acetaminophen -caffeine (FIORICET) 50-325-40 mg tablet, Take 2 tablets by mouth 2 (two) times daily, Disp: 60 tablet, Rfl: 2 .  clonazePAM (KLONOPIN) 0.5 MG tablet, Take 1 tablet (0.5 mg total) by mouth 2 (two) times daily for 30 days, Disp: 60 tablet, Rfl: 0 .  fludrocortisone (FLORINEF) 0.1 mg tablet, Take 1 tablet (0.1 mg total) by mouth once daily, Disp: 30 tablet, Rfl: 11 .  fluticasone propionate (FLONASE) 50 mcg/actuation nasal spray, Place 2 sprays into both nostrils 2 (two) times daily, Disp: , Rfl:  .  lamoTRIgine  (LAMICTAL ) 100 MG tablet, TAKE 0.5 TABLETS (50 MG TOTAL) BY MOUTH EVERY  12 (TWELVE) HOURS, Disp: 90 tablet, Rfl: 1 .  latanoprost (XALATAN) 0.005 % ophthalmic solution, 1 drop nightly, Disp: , Rfl:  .  memantine (NAMENDA) 5 MG tablet, TAKE 1 TABLET BY MOUTH TWICE A DAY, Disp: 180 tablet, Rfl: 3 .  mirtazapine (REMERON) 15 MG tablet, TAKE 1 TABLET BY MOUTH EVERYDAY AT BEDTIME, Disp: 90 tablet, Rfl: 4 .  montelukast (SINGULAIR) 10 mg tablet, Take 10 mg by mouth nightly., Disp: , Rfl:  .  omeprazole (PRILOSEC) 40 MG DR capsule, TAKE 1 CAPSULE BY MOUTH EVERY DAY, Disp: 90 capsule, Rfl: 3 .  sertraline (ZOLOFT) 50 MG tablet, Take 1 tablet (50 mg total)  by mouth once daily for 360 days, Disp: 90 tablet, Rfl: 3 .  solifenacin  (VESICARE ) 5 MG tablet, Take 5 mg by mouth once daily, Disp: , Rfl:  .  tolterodine  (DETROL  LA) 4 MG LA capsule, Take 1 capsule (4 mg total) by mouth once daily, Disp: 90 capsule, Rfl: 3  Augmentin [amoxicillin-pot clavulanate], Adhesive, Carafate [sucralfate], Clindamycin, Minocycline, and Sulfa (sulfonamide antibiotics)  Social History   Socioeconomic History  . Marital status: Widowed  Tobacco Use  . Smoking status: Never  . Smokeless tobacco: Never  Vaping Use  . Vaping status: Never Used  Substance and Sexual Activity  . Alcohol use: No    Alcohol/week: 0.0 standard drinks of alcohol    Comment: 1 drink a month or less  . Drug use: No  . Sexual activity: Not Currently  Social History Narrative   [place] a house by herself, no pets.  She comes to the appointment with a good friend and neighbor, Rolan Shine, who is privy to her medical history.  He does not have pets either.  She was widowed in 11/2004.  She worked for [employer] for a couple of years, also for [employer] for a couple of years, Investment banker, corporate work; last worked outside the home a long time ago.  She walks every day, takes care of her house.  She travels to see family in [place]  (Dan's family) and [place]  (her family) intermittently, about 3 times a year.    Family History  Problem Relation Name Age of Onset  . Stroke Mother    . Lung cancer Father    . Seizures Sister    . Epilepsy Sister      A comprehensive ROS was negative except for HPI  PE: BP 134/70   Pulse 68   Ht 152.4 cm (5')   SpO2 95%   BMI 25.90 kg/m  General: Alert oriented x3  Skin: No suspicious lesions or moles.  Facial rash noted Eyes: Sclera and conjunctiva clear; pupils equal round and reactive to light and accommodation; extraocular movements intact   Nose: Mucosa healthy without drainage or ulceration Oropharynx: No suspicious  lesions Neck: No swelling, masses, stiffness, pain, limited movement, carotid pulses normal bilaterally, thyroid  normal size, no masses palpated. No bruits heard Lungs: Respirations unlabored; clear to auscultation bilaterally Back: No spinal deformity Cardiovascular: Heart regular rate and rhythm without murmurs, gallops, or rubs Abdomen: Soft; non tender; non distended;  no masses or organomegaly Lymph Nodes: No significant cervical, supraclavicular, or axillary lymphadenopathy noted Musculoskeletal: No active joint inflammation Extremities: Normal, no edema Pulses: Dorsalis pedis palpable and symmetric bilaterally Neurologic: Alert and oriented; speech intact; face symmetrical; moves all extremities well    Office Visit on 04/18/2022  Component Date Value Ref Range Status  . Sedimentation Rate-Automated 04/18/2022 13  0 - 30 mm/hr Final  . C Reactive Protein - [facility] 04/18/2022 7  0 - 10 mg/L Final  . Vitamin B12 04/18/2022 598  >300 pg/mL Final  . Vitamin D, 25-Hydroxy - [facility] 04/18/2022 25.9 (L)  30.0 - 100.0 ng/mL Final  Office Visit on 04/16/2022  Component Date Value Ref Range Status  . Cholesterol, Total 04/16/2022 213 (H)  100 - 200 mg/dL Final  . Triglyceride 98/76/7975 256 (H)  35 - 199 mg/dL Final  . HDL (High Density Lipoprotein) Cho* 04/16/2022 49.1  35.0 - 85.0 mg/dL Final  . LDL Calculated 04/16/2022 886  0 - 130 mg/dL Final  . VLDL Cholesterol 04/16/2022 51  mg/dL Final  . Cholesterol/HDL Ratio 04/16/2022 4.3   Final  . WBC (White Blood Cell Count) 04/16/2022 6.8  4.1 - 10.2 10^3/uL Final  . RBC (Red Blood Cell Count) 04/16/2022 3.95 (L)  4.04 - 5.48 10^6/uL Final  . Hemoglobin 04/16/2022 12.0  12.0 - 15.0 gm/dL Final  . Hematocrit 98/76/7975 36.6  35.0 - 47.0 % Final  . MCV (Mean Corpuscular Volume) 04/16/2022 92.7  80.0 - 100.0 fl Final  . MCH (Mean Corpuscular Hemoglobin) 04/16/2022 30.4  27.0 - 31.2 pg Final  . MCHC (Mean Corpuscular Hemoglobin * 04/16/2022  32.8  32.0 - 36.0 gm/dL Final  . Platelet Count 04/16/2022 227  150 - 450 10^3/uL Final  . RDW-CV (Red Cell Distribution Widt* 04/16/2022 14.5  11.6 - 14.8 % Final  . MPV (Mean Platelet Volume) 04/16/2022 9.8  9.4 - 12.4 fl Final  . Neutrophils 04/16/2022 3.76  1.50 - 7.80 10^3/uL Final  . Lymphocytes 04/16/2022 2.25  1.00 - 3.60 10^3/uL Final  . Monocytes 04/16/2022 0.48  0.00 - 1.50 10^3/uL Final  . Eosinophils 04/16/2022 0.19  0.00 - 0.55 10^3/uL Final  . Basophils 04/16/2022 0.05  0.00 - 0.09 10^3/uL Final  . Neutrophil % 04/16/2022 55.8  32.0 - 70.0 % Final  . Lymphocyte % 04/16/2022 33.3  10.0 - 50.0 % Final  . Monocyte % 04/16/2022 7.1  4.0 - 13.0 % Final  . Eosinophil % 04/16/2022 2.8  1.0 - 5.0 % Final  . Basophil% 04/16/2022 0.7  0.0 - 2.0 % Final  . Immature Granulocyte % 04/16/2022 0.3  <=0.7 % Final  . Immature Granulocyte Count 04/16/2022 0.02  <=0.06 10^3/L Final  . Glucose 04/16/2022 89  70 - 110 mg/dL Final  . Sodium 98/76/7975 139  136 - 145 mmol/L Final  . Potassium 04/16/2022 4.6  3.6 - 5.1 mmol/L Final  . Chloride 04/16/2022 106  97 - 109 mmol/L Final  . Carbon Dioxide (CO2) 04/16/2022 26.4  22.0 - 32.0 mmol/L Final  . Urea Nitrogen (BUN) 04/16/2022 25  7 - 25 mg/dL Final  . Creatinine 98/76/7975 0.8  0.6 - 1.1 mg/dL Final  . Glomerular Filtration Rate (eGFR) 04/16/2022 71  >60 mL/min/1.73sq m Final  . Calcium 04/16/2022 9.4  8.7 - 10.3 mg/dL Final  . AST  98/76/7975 17  8 - 39 U/L Final  . ALT  04/16/2022 12  5 - 38 U/L Final  . Alk Phos (alkaline Phosphatase) 04/16/2022 151 (H)  34 - 104 U/L Final  . Albumin 04/16/2022 3.9  3.5 - 4.8 g/dL Final  . Bilirubin, Total 04/16/2022 0.2 (L)  0.3 - 1.2 mg/dL Final  . Protein, Total 04/16/2022 6.7  6.1 - 7.9 g/dL Final  . A/G Ratio 98/76/7975 1.4  1.0 - 5.0 gm/dL Final  . Thyroid  Stimulating Hormone (TSH)  04/16/2022 1.146  0.450-5.330 uIU/ml uIU/mL Final  . Color 04/17/2022 Yellow  Colorless, Straw, Light Yellow,  Yellow, Dark Yellow Final  . Clarity 04/17/2022 Clear  Clear Final  . Specific Gravity 04/17/2022 1.030  1.005 - 1.030 Final  . pH, Urine 04/17/2022 5.5  5.0 - 8.0 Final  . Protein, Urinalysis 04/17/2022 Trace (!)  Negative mg/dL Final  . Glucose, Urinalysis 04/17/2022 Negative  Negative mg/dL Final  . Ketones, Urinalysis 04/17/2022 Negative  Negative mg/dL Final  . Blood, Urinalysis 04/17/2022 Negative  Negative Final  . Nitrite, Urinalysis 04/17/2022 Negative  Negative Final  . Leukocyte Esterase, Urinalysis 04/17/2022 2+ (!)  Negative Final  . Bilirubin, Urinalysis 04/17/2022 Negative  Negative Final  . Urobilinogen, Urinalysis 04/17/2022 0.2  0.2 - 1.0 mg/dL Final  . WBC, UA 98/75/7975 9 (H)  <=5 /hpf Final  . Red Blood Cells, Urinalysis 04/17/2022 3  <=3 /hpf Final  . Bacteria, Urinalysis 04/17/2022 0-5  0 - 5 /hpf Final  . Squamous Epithelial Cells, Urinaly* 04/17/2022 3  /hpf Final  . Calcium Oxalate Crystals 04/17/2022 PRESENT (!)  None Seen Final  . Hemoglobin A1C 04/16/2022 6.2 (H)  4.2 - 5.6 % Final  . Average Blood Glucose (Calc) 04/16/2022 131  mg/dL Final  Office Visit on 11/12/2021  Component Date Value Ref Range Status  . WBC (White Blood Cell Count) 11/12/2021 6.1  4.1 - 10.2 10^3/uL Final  . RBC (Red Blood Cell Count) 11/12/2021 3.95 (L)  4.04 - 5.48 10^6/uL Final  . Hemoglobin 11/12/2021 11.9 (L)  12.0 - 15.0 gm/dL Final  . Hematocrit 91/78/7976 37.2  35.0 - 47.0 % Final  . MCV (Mean Corpuscular Volume) 11/12/2021 94.2  80.0 - 100.0 fl Final  . MCH (Mean Corpuscular Hemoglobin) 11/12/2021 30.1  27.0 - 31.2 pg Final  . MCHC (Mean Corpuscular Hemoglobin * 11/12/2021 32.0  32.0 - 36.0 gm/dL Final  . Platelet Count 11/12/2021 234  150 - 450 10^3/uL Final  . RDW-CV (Red Cell Distribution Widt* 11/12/2021 13.2  11.6 - 14.8 % Final  . MPV (Mean Platelet Volume) 11/12/2021 10.2  9.4 - 12.4 fl Final  . Neutrophils 11/12/2021 2.73  1.50 - 7.80 10^3/uL Final  . Lymphocytes  11/12/2021 2.40  1.00 - 3.60 10^3/uL Final  . Monocytes 11/12/2021 0.59  0.00 - 1.50 10^3/uL Final  . Eosinophils 11/12/2021 0.29  0.00 - 0.55 10^3/uL Final  . Basophils 11/12/2021 0.05  0.00 - 0.09 10^3/uL Final  . Neutrophil % 11/12/2021 44.9  32.0 - 70.0 % Final  . Lymphocyte % 11/12/2021 39.5  10.0 - 50.0 % Final  . Monocyte % 11/12/2021 9.7  4.0 - 13.0 % Final  . Eosinophil % 11/12/2021 4.8  1.0 - 5.0 % Final  . Basophil% 11/12/2021 0.8  0.0 - 2.0 % Final  . Immature Granulocyte % 11/12/2021 0.3  <=0.7 % Final  . Immature Granulocyte Count 11/12/2021 0.02  <=0.06 10^3/L Final  . Glucose 11/12/2021 96  70 - 110 mg/dL Final  . Sodium 91/78/7976 139  136 - 145 mmol/L Final  . Potassium 11/12/2021 4.3  3.6 - 5.1 mmol/L Final  . Chloride 11/12/2021 108  97 - 109 mmol/L Final  . Carbon Dioxide (CO2) 11/12/2021 24.5  22.0 - 32.0 mmol/L Final  . Urea Nitrogen (BUN) 11/12/2021 23  7 - 25 mg/dL Final  . Creatinine 91/78/7976 0.8  0.6 - 1.1 mg/dL Final  . Glomerular Filtration Rate (eGFR) 11/12/2021 68  >60 mL/min/1.73sq m Final  . Calcium 11/12/2021  9.4  8.7 - 10.3 mg/dL Final  . AST  91/78/7976 13  8 - 39 U/L Final  . ALT  11/12/2021 13  5 - 38 U/L Final  . Alk Phos (alkaline Phosphatase) 11/12/2021 127 (H)  34 - 104 U/L Final  . Albumin 11/12/2021 3.8  3.5 - 4.8 g/dL Final  . Bilirubin, Total 11/12/2021 0.3  0.3 - 1.2 mg/dL Final  . Protein, Total 11/12/2021 6.5  6.1 - 7.9 g/dL Final  . A/G Ratio 91/78/7976 1.4  1.0 - 5.0 gm/dL Final  . Color 91/78/7976 Yellow  Colorless, Straw, Light Yellow, Yellow, Dark Yellow Final  . Clarity 11/12/2021 Cloudy (!)  Clear Final  . Specific Gravity 11/12/2021 1.024  1.005 - 1.030 Final  . pH, Urine 11/12/2021 5.5  5.0 - 8.0 Final  . Protein, Urinalysis 11/12/2021 Trace (!)  Negative mg/dL Final  . Glucose, Urinalysis 11/12/2021 Negative  Negative mg/dL Final  . Ketones, Urinalysis 11/12/2021 Negative  Negative mg/dL Final  . Blood, Urinalysis  11/12/2021 Negative  Negative Final  . Nitrite, Urinalysis 11/12/2021 Negative  Negative Final  . Leukocyte Esterase, Urinalysis 11/12/2021 3+ (!)  Negative Final  . Bilirubin, Urinalysis 11/12/2021 Negative  Negative Final  . Urobilinogen, Urinalysis 11/12/2021 0.2  0.2 - 1.0 mg/dL Final  . Mucous, Urine 11/12/2021 PRESENT (!)  None Seen Final  . WBC, UA 11/12/2021 25 (H)  <=5 /hpf Final  . Red Blood Cells, Urinalysis 11/12/2021 33 (H)  <=3 /hpf Final  . Bacteria, Urinalysis 11/12/2021 0-5  0 - 5 /hpf Final  . Squamous Epithelial Cells, Urinaly* 11/12/2021 15  /hpf Final  . Hemoglobin A1C 11/12/2021 6.4 (H)  4.2 - 5.6 % Final  . 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 (TP)  Reyes JONETTA Costa, MD, MD

## 2022-09-23 ENCOUNTER — Ambulatory Visit: Payer: PPO | Admitting: Urology

## 2022-09-23 DIAGNOSIS — N1832 Chronic kidney disease, stage 3b: Secondary | ICD-10-CM | POA: Diagnosis not present

## 2022-09-23 DIAGNOSIS — R270 Ataxia, unspecified: Secondary | ICD-10-CM | POA: Diagnosis not present

## 2022-09-23 DIAGNOSIS — G301 Alzheimer's disease with late onset: Secondary | ICD-10-CM | POA: Diagnosis not present

## 2022-09-23 DIAGNOSIS — R627 Adult failure to thrive: Secondary | ICD-10-CM | POA: Diagnosis not present

## 2022-09-23 DIAGNOSIS — F02C4 Dementia in other diseases classified elsewhere, severe, with anxiety: Secondary | ICD-10-CM | POA: Diagnosis not present

## 2022-09-27 DIAGNOSIS — G309 Alzheimer's disease, unspecified: Secondary | ICD-10-CM | POA: Diagnosis not present

## 2022-09-27 DIAGNOSIS — F0280 Dementia in other diseases classified elsewhere without behavioral disturbance: Secondary | ICD-10-CM | POA: Diagnosis not present

## 2022-09-28 DIAGNOSIS — Z9181 History of falling: Secondary | ICD-10-CM | POA: Diagnosis not present

## 2022-09-28 DIAGNOSIS — R2689 Other abnormalities of gait and mobility: Secondary | ICD-10-CM | POA: Diagnosis not present

## 2022-10-14 ENCOUNTER — Ambulatory Visit: Payer: PPO | Admitting: Urology

## 2022-10-14 ENCOUNTER — Encounter: Payer: Self-pay | Admitting: Urology

## 2022-10-14 DIAGNOSIS — N3946 Mixed incontinence: Secondary | ICD-10-CM | POA: Diagnosis not present

## 2022-10-14 NOTE — Addendum Note (Signed)
Addended by: Sueanne Margarita on: 10/14/2022 03:33 PM   Modules accepted: Orders

## 2022-10-14 NOTE — Progress Notes (Signed)
10/14/2022 2:41 PM   Gigi Gin Fuller Song 04-12-1932 [id number]  Referring provider: Marguarite Arbour, MD [address] [facility] [place],  [place] [address]  Chief Complaint  Patient presents with   Follow-up    6 week follow-up   Urinary Incontinence    HPI: Reviewed note.  Mixed incontinence and predominant urge incontinence 50% better on Myrbetriq but stopped due to dry mouth.  Was doing well on Detrol monotherapy.  Discontinued percutaneous tibial nerve stimulation.  Added Gemtesa to Detrol.  I saw him in January 2024 and culture was negative   Still has urge incontinence.  She wet the bed once.  She cannot leave a urine sample.  Frequency persisting.  Still on Detrol.     Unfortunately were reaching the end of the treatment pathway for the patient.  She is elderly in a wheelchair and I do not think Botox is in her best interest.  This can always be discussed.  She is certainly not going to have InterStim.  Reassess on Vesicare 5 mg 30 x 11.  Get urine culture next visit.  I think we need to have reasonable treatment goals.  Hopefully the patient understands the treatment pathway   Today Frequency stable.  Culture in January was negative.minium benefit but would like to stay on it  PMH: Past Medical History:  Diagnosis Date   Allergy    Arthritis    Cataract    GERD (gastroesophageal reflux disease)    Glaucoma    Heart murmur    Myocardial infarction (HCC)    Seizures (HCC)    Sleep apnea    Stroke [facility])     Surgical History: Past Surgical History:  Procedure Laterality Date   BREAST BIOPSY Right    CORE W/CLIP X 2 - NEG    Home Medications:  Allergies as of 10/14/2022       Reactions   Amoxicillin-pot Clavulanate Rash   Ciprofloxacin    Other reaction(s): Unknown Pt doesn't remember   Clindamycin    Other reaction(s): Unknown   Minocycline    Other reaction(s): Unknown Pt states made her feel "funny"   Sucralfate    Other reaction(s):  Other (See Comments) "can't take"   Sulfa Antibiotics Rash   Had a small area of rash about 30 years ago when she took sulfa, has not taken since.   Tape Rash        Medication List        Accurate as of October 14, 2022  2:41 PM. If you have any questions, ask your nurse or doctor.          acetaminophen 325 MG tablet Commonly known as: TYLENOL Take 650 mg by mouth every 6 (six) hours as needed.   azelastine 0.1 % nasal spray Commonly known as: ASTELIN   dorzolamide-timolol 2-0.5 % ophthalmic solution Commonly known as: COSOPT 1 drop daily in the afternoon.   fludrocortisone 0.1 MG tablet Commonly known as: FLORINEF Take 0.1 mg by mouth daily.   fluticasone 50 MCG/ACT nasal spray Commonly known as: FLONASE 1 spray 2 (two) times daily at 10 AM and 5 PM.   lamoTRIgine 100 MG tablet Commonly known as: LAMICTAL Take 100 mg by mouth daily.   latanoprost 0.005 % ophthalmic solution Commonly known as: XALATAN at bedtime.   memantine 5 MG tablet Commonly known as: NAMENDA Take 5 mg by mouth 2 (two) times daily.   montelukast 10 MG tablet Commonly known as: SINGULAIR  TAKE ONE TABLET DAILY EACH EVENING PRIOR TO BEDTIME   omeprazole 40 MG capsule Commonly known as: PRILOSEC Take 40 mg by mouth daily.   sertraline 50 MG tablet Commonly known as: ZOLOFT Take 50 mg by mouth daily.   solifenacin 5 MG tablet Commonly known as: VESICARE Take 1 tablet (5 mg total) by mouth daily.        Allergies:  Allergies  Allergen Reactions   Amoxicillin-Pot Clavulanate Rash   Ciprofloxacin     Other reaction(s): Unknown Pt doesn't remember   Clindamycin     Other reaction(s): Unknown   Minocycline     Other reaction(s): Unknown Pt states made her feel "funny"   Sucralfate     Other reaction(s): Other (See Comments) "can't take"   Sulfa Antibiotics Rash    Had a small area of rash about 30 years ago when she took sulfa, has not taken since.   Tape Rash     Family History: Family History  Problem Relation Age of Onset   Stroke Mother    Stroke Father    Breast cancer Neg Hx     Social History:  reports that she has never smoked. She has never been exposed to tobacco smoke. She has never used smokeless tobacco. She reports that she does not drink alcohol and does not use drugs.  ROS:                                        Physical Exam: There were no vitals taken for this visit.  Constitutional:  Alert and oriented, No acute distress. HEENT: [place] AT, moist mucus membranes.  Trachea midline, no masses.   Laboratory Data: Lab Results  Component Value Date   WBC 6.2 09/12/2022   HGB 11.2 (L) 09/12/2022   HCT 34.1 (L) 09/12/2022   MCV 91.9 09/12/2022   PLT 219 09/12/2022    Lab Results  Component Value Date   CREATININE 0.73 08/06/2022    No results found for: "PSA"  No results found for: "TESTOSTERONE"  No results found for: "HGBA1C"  Urinalysis    Component Value Date/Time   COLORURINE STRAW (A) 10/18/2021 1518   APPEARANCEUR Hazy (A) 04/22/2022 1318   LABSPEC 1.008 10/18/2021 1518   PHURINE 6.0 10/18/2021 1518   GLUCOSEU Negative 04/22/2022 1318   HGBUR NEGATIVE 10/18/2021 1518   BILIRUBINUR Negative 04/22/2022 1318   KETONESUR NEGATIVE 10/18/2021 1518   PROTEINUR 1+ (A) 04/22/2022 1318   PROTEINUR NEGATIVE 10/18/2021 1518   NITRITE Negative 04/22/2022 1318   NITRITE NEGATIVE 10/18/2021 1518   LEUKOCYTESUR 1+ (A) 04/22/2022 1318   LEUKOCYTESUR MODERATE (A) 10/18/2021 1518    Pertinent Imaging:   Assessment & Plan:  30 x 11 and see in one year; reached end of treatment pathway No botox  There are no diagnoses linked to this encounter.  No follow-ups on file.  Martina Sinner, MD  [facility] [address] [place], [place] [address] [phone]

## 2022-10-15 LAB — URINALYSIS, COMPLETE
Bilirubin, UA: NEGATIVE
Glucose, UA: NEGATIVE
Nitrite, UA: NEGATIVE
Specific Gravity, UA: 1.02 (ref 1.005–1.030)
Urobilinogen, Ur: 2 mg/dL — ABNORMAL HIGH (ref 0.2–1.0)
pH, UA: 6.5 (ref 5.0–7.5)

## 2022-10-15 LAB — MICROSCOPIC EXAMINATION: WBC, UA: >30 /hpf — AB (ref 0–5)

## 2022-10-21 ENCOUNTER — Encounter: Payer: Self-pay | Admitting: Urology

## 2022-10-22 DIAGNOSIS — M47816 Spondylosis without myelopathy or radiculopathy, lumbar region: Secondary | ICD-10-CM | POA: Diagnosis not present

## 2022-10-22 DIAGNOSIS — F0280 Dementia in other diseases classified elsewhere without behavioral disturbance: Secondary | ICD-10-CM | POA: Diagnosis not present

## 2022-10-22 DIAGNOSIS — M5136 Other intervertebral disc degeneration, lumbar region: Secondary | ICD-10-CM | POA: Diagnosis not present

## 2022-10-22 DIAGNOSIS — G894 Chronic pain syndrome: Secondary | ICD-10-CM | POA: Diagnosis not present

## 2022-10-22 DIAGNOSIS — M1990 Unspecified osteoarthritis, unspecified site: Secondary | ICD-10-CM | POA: Diagnosis not present

## 2022-10-22 DIAGNOSIS — G309 Alzheimer's disease, unspecified: Secondary | ICD-10-CM | POA: Diagnosis not present

## 2022-10-22 DIAGNOSIS — G4730 Sleep apnea, unspecified: Secondary | ICD-10-CM | POA: Diagnosis not present

## 2022-10-25 DIAGNOSIS — M533 Sacrococcygeal disorders, not elsewhere classified: Secondary | ICD-10-CM | POA: Diagnosis not present

## 2022-10-25 DIAGNOSIS — M545 Low back pain, unspecified: Secondary | ICD-10-CM | POA: Diagnosis not present

## 2022-10-25 DIAGNOSIS — M47816 Spondylosis without myelopathy or radiculopathy, lumbar region: Secondary | ICD-10-CM | POA: Diagnosis not present

## 2022-10-25 DIAGNOSIS — M5136 Other intervertebral disc degeneration, lumbar region: Secondary | ICD-10-CM | POA: Diagnosis not present

## 2022-10-25 DIAGNOSIS — M47818 Spondylosis without myelopathy or radiculopathy, sacral and sacrococcygeal region: Secondary | ICD-10-CM | POA: Diagnosis not present

## 2022-10-25 DIAGNOSIS — G8929 Other chronic pain: Secondary | ICD-10-CM | POA: Diagnosis not present

## 2022-10-29 DIAGNOSIS — Z9181 History of falling: Secondary | ICD-10-CM | POA: Diagnosis not present

## 2022-10-29 DIAGNOSIS — R2689 Other abnormalities of gait and mobility: Secondary | ICD-10-CM | POA: Diagnosis not present

## 2022-11-19 DIAGNOSIS — Z9181 History of falling: Secondary | ICD-10-CM | POA: Diagnosis not present

## 2022-11-19 DIAGNOSIS — M25551 Pain in right hip: Secondary | ICD-10-CM | POA: Diagnosis not present

## 2022-11-19 DIAGNOSIS — F0390 Unspecified dementia without behavioral disturbance: Secondary | ICD-10-CM | POA: Diagnosis not present

## 2022-11-19 DIAGNOSIS — J309 Allergic rhinitis, unspecified: Secondary | ICD-10-CM | POA: Diagnosis not present

## 2022-11-19 DIAGNOSIS — R262 Difficulty in walking, not elsewhere classified: Secondary | ICD-10-CM | POA: Diagnosis not present

## 2022-11-19 DIAGNOSIS — M545 Low back pain, unspecified: Secondary | ICD-10-CM | POA: Diagnosis not present

## 2022-11-20 DIAGNOSIS — F0390 Unspecified dementia without behavioral disturbance: Secondary | ICD-10-CM | POA: Diagnosis not present

## 2022-11-22 DIAGNOSIS — J309 Allergic rhinitis, unspecified: Secondary | ICD-10-CM | POA: Diagnosis not present

## 2022-11-22 DIAGNOSIS — M545 Low back pain, unspecified: Secondary | ICD-10-CM | POA: Diagnosis not present

## 2022-11-22 DIAGNOSIS — E785 Hyperlipidemia, unspecified: Secondary | ICD-10-CM | POA: Diagnosis not present

## 2022-11-22 DIAGNOSIS — F0390 Unspecified dementia without behavioral disturbance: Secondary | ICD-10-CM | POA: Diagnosis not present

## 2022-11-22 DIAGNOSIS — Z9181 History of falling: Secondary | ICD-10-CM | POA: Diagnosis not present

## 2022-11-28 DIAGNOSIS — H353132 Nonexudative age-related macular degeneration, bilateral, intermediate dry stage: Secondary | ICD-10-CM | POA: Diagnosis not present

## 2022-11-28 DIAGNOSIS — H401122 Primary open-angle glaucoma, left eye, moderate stage: Secondary | ICD-10-CM | POA: Diagnosis not present

## 2022-11-28 DIAGNOSIS — H401111 Primary open-angle glaucoma, right eye, mild stage: Secondary | ICD-10-CM | POA: Diagnosis not present

## 2022-11-28 DIAGNOSIS — H10502 Unspecified blepharoconjunctivitis, left eye: Secondary | ICD-10-CM | POA: Diagnosis not present

## 2022-11-29 DIAGNOSIS — Z9181 History of falling: Secondary | ICD-10-CM | POA: Diagnosis not present

## 2022-11-29 DIAGNOSIS — R2689 Other abnormalities of gait and mobility: Secondary | ICD-10-CM | POA: Diagnosis not present

## 2022-12-05 DIAGNOSIS — K5900 Constipation, unspecified: Secondary | ICD-10-CM | POA: Diagnosis not present

## 2022-12-05 DIAGNOSIS — R2681 Unsteadiness on feet: Secondary | ICD-10-CM | POA: Diagnosis not present

## 2022-12-05 DIAGNOSIS — D649 Anemia, unspecified: Secondary | ICD-10-CM | POA: Diagnosis not present

## 2022-12-05 DIAGNOSIS — G8929 Other chronic pain: Secondary | ICD-10-CM | POA: Diagnosis not present

## 2022-12-19 DIAGNOSIS — K5900 Constipation, unspecified: Secondary | ICD-10-CM | POA: Diagnosis not present

## 2022-12-19 DIAGNOSIS — W19XXXA Unspecified fall, initial encounter: Secondary | ICD-10-CM | POA: Diagnosis not present

## 2022-12-19 DIAGNOSIS — R2681 Unsteadiness on feet: Secondary | ICD-10-CM | POA: Diagnosis not present

## 2022-12-19 DIAGNOSIS — D649 Anemia, unspecified: Secondary | ICD-10-CM | POA: Diagnosis not present

## 2022-12-19 DIAGNOSIS — G301 Alzheimer's disease with late onset: Secondary | ICD-10-CM | POA: Diagnosis not present

## 2022-12-19 DIAGNOSIS — J309 Allergic rhinitis, unspecified: Secondary | ICD-10-CM | POA: Diagnosis not present

## 2022-12-19 DIAGNOSIS — E559 Vitamin D deficiency, unspecified: Secondary | ICD-10-CM | POA: Diagnosis not present

## 2022-12-19 DIAGNOSIS — G8929 Other chronic pain: Secondary | ICD-10-CM | POA: Diagnosis not present

## 2022-12-20 DIAGNOSIS — F331 Major depressive disorder, recurrent, moderate: Secondary | ICD-10-CM | POA: Diagnosis not present

## 2022-12-20 DIAGNOSIS — G309 Alzheimer's disease, unspecified: Secondary | ICD-10-CM | POA: Diagnosis not present

## 2022-12-23 DIAGNOSIS — G309 Alzheimer's disease, unspecified: Secondary | ICD-10-CM | POA: Diagnosis not present

## 2022-12-29 DIAGNOSIS — R2689 Other abnormalities of gait and mobility: Secondary | ICD-10-CM | POA: Diagnosis not present

## 2022-12-29 DIAGNOSIS — Z9181 History of falling: Secondary | ICD-10-CM | POA: Diagnosis not present

## 2022-12-30 DIAGNOSIS — M545 Low back pain, unspecified: Secondary | ICD-10-CM | POA: Diagnosis not present

## 2022-12-30 DIAGNOSIS — M25551 Pain in right hip: Secondary | ICD-10-CM | POA: Diagnosis not present

## 2022-12-30 DIAGNOSIS — F03B0 Unspecified dementia, moderate, without behavioral disturbance, psychotic disturbance, mood disturbance, and anxiety: Secondary | ICD-10-CM | POA: Diagnosis not present

## 2022-12-30 DIAGNOSIS — Z79899 Other long term (current) drug therapy: Secondary | ICD-10-CM | POA: Diagnosis not present

## 2022-12-30 DIAGNOSIS — Z9181 History of falling: Secondary | ICD-10-CM | POA: Diagnosis not present

## 2022-12-30 DIAGNOSIS — Z87891 Personal history of nicotine dependence: Secondary | ICD-10-CM | POA: Diagnosis not present

## 2022-12-30 DIAGNOSIS — J309 Allergic rhinitis, unspecified: Secondary | ICD-10-CM | POA: Diagnosis not present

## 2022-12-30 DIAGNOSIS — Z7952 Long term (current) use of systemic steroids: Secondary | ICD-10-CM | POA: Diagnosis not present

## 2022-12-30 DIAGNOSIS — G8929 Other chronic pain: Secondary | ICD-10-CM | POA: Diagnosis not present

## 2022-12-31 DIAGNOSIS — E876 Hypokalemia: Secondary | ICD-10-CM | POA: Diagnosis not present

## 2023-01-06 DIAGNOSIS — E782 Mixed hyperlipidemia: Secondary | ICD-10-CM | POA: Diagnosis not present

## 2023-01-07 DIAGNOSIS — G4700 Insomnia, unspecified: Secondary | ICD-10-CM | POA: Diagnosis not present

## 2023-01-07 DIAGNOSIS — G309 Alzheimer's disease, unspecified: Secondary | ICD-10-CM | POA: Diagnosis not present

## 2023-01-07 DIAGNOSIS — G26 Extrapyramidal and movement disorders in diseases classified elsewhere: Secondary | ICD-10-CM | POA: Diagnosis not present

## 2023-01-07 DIAGNOSIS — F331 Major depressive disorder, recurrent, moderate: Secondary | ICD-10-CM | POA: Diagnosis not present

## 2023-01-07 DIAGNOSIS — F419 Anxiety disorder, unspecified: Secondary | ICD-10-CM | POA: Diagnosis not present

## 2023-01-15 DIAGNOSIS — E876 Hypokalemia: Secondary | ICD-10-CM | POA: Diagnosis not present

## 2023-01-16 DIAGNOSIS — M25551 Pain in right hip: Secondary | ICD-10-CM | POA: Diagnosis not present

## 2023-01-16 DIAGNOSIS — F03B0 Unspecified dementia, moderate, without behavioral disturbance, psychotic disturbance, mood disturbance, and anxiety: Secondary | ICD-10-CM | POA: Diagnosis not present

## 2023-01-16 DIAGNOSIS — Z6820 Body mass index (BMI) 20.0-20.9, adult: Secondary | ICD-10-CM | POA: Diagnosis not present

## 2023-01-16 DIAGNOSIS — N3281 Overactive bladder: Secondary | ICD-10-CM | POA: Diagnosis not present

## 2023-01-16 DIAGNOSIS — J309 Allergic rhinitis, unspecified: Secondary | ICD-10-CM | POA: Diagnosis not present

## 2023-01-16 DIAGNOSIS — M549 Dorsalgia, unspecified: Secondary | ICD-10-CM | POA: Diagnosis not present

## 2023-01-16 DIAGNOSIS — R2681 Unsteadiness on feet: Secondary | ICD-10-CM | POA: Diagnosis not present

## 2023-01-16 DIAGNOSIS — E876 Hypokalemia: Secondary | ICD-10-CM | POA: Diagnosis not present

## 2023-01-16 DIAGNOSIS — M6281 Muscle weakness (generalized): Secondary | ICD-10-CM | POA: Diagnosis not present

## 2023-01-16 DIAGNOSIS — R634 Abnormal weight loss: Secondary | ICD-10-CM | POA: Diagnosis not present

## 2023-01-16 DIAGNOSIS — M545 Low back pain, unspecified: Secondary | ICD-10-CM | POA: Diagnosis not present

## 2023-01-16 DIAGNOSIS — H409 Unspecified glaucoma: Secondary | ICD-10-CM | POA: Diagnosis not present

## 2023-01-22 DIAGNOSIS — M79651 Pain in right thigh: Secondary | ICD-10-CM | POA: Diagnosis not present

## 2023-01-22 DIAGNOSIS — G309 Alzheimer's disease, unspecified: Secondary | ICD-10-CM | POA: Diagnosis not present

## 2023-01-22 DIAGNOSIS — F331 Major depressive disorder, recurrent, moderate: Secondary | ICD-10-CM | POA: Diagnosis not present

## 2023-01-22 DIAGNOSIS — M25551 Pain in right hip: Secondary | ICD-10-CM | POA: Diagnosis not present

## 2023-01-22 DIAGNOSIS — W19XXXA Unspecified fall, initial encounter: Secondary | ICD-10-CM | POA: Diagnosis not present

## 2023-01-23 DIAGNOSIS — R350 Frequency of micturition: Secondary | ICD-10-CM | POA: Diagnosis not present

## 2023-01-23 DIAGNOSIS — M79604 Pain in right leg: Secondary | ICD-10-CM | POA: Diagnosis not present

## 2023-01-23 DIAGNOSIS — W19XXXA Unspecified fall, initial encounter: Secondary | ICD-10-CM | POA: Diagnosis not present

## 2023-01-23 DIAGNOSIS — R030 Elevated blood-pressure reading, without diagnosis of hypertension: Secondary | ICD-10-CM | POA: Diagnosis not present

## 2023-01-23 DIAGNOSIS — M25551 Pain in right hip: Secondary | ICD-10-CM | POA: Diagnosis not present

## 2023-01-27 DIAGNOSIS — F411 Generalized anxiety disorder: Secondary | ICD-10-CM | POA: Diagnosis not present

## 2023-01-27 DIAGNOSIS — F331 Major depressive disorder, recurrent, moderate: Secondary | ICD-10-CM | POA: Diagnosis not present

## 2023-01-27 DIAGNOSIS — F5105 Insomnia due to other mental disorder: Secondary | ICD-10-CM | POA: Diagnosis not present

## 2023-01-27 DIAGNOSIS — G301 Alzheimer's disease with late onset: Secondary | ICD-10-CM | POA: Diagnosis not present

## 2023-01-27 DIAGNOSIS — F0280 Dementia in other diseases classified elsewhere without behavioral disturbance: Secondary | ICD-10-CM | POA: Diagnosis not present

## 2023-01-29 ENCOUNTER — Emergency Department
Admission: EM | Admit: 2023-01-29 | Discharge: 2023-01-30 | Disposition: A | Discharge status: Skilled Nursing Facility | Payer: PPO | Attending: Emergency Medicine | Admitting: Emergency Medicine

## 2023-01-29 ENCOUNTER — Emergency Department: Payer: PPO

## 2023-01-29 ENCOUNTER — Other Ambulatory Visit: Payer: Self-pay

## 2023-01-29 DIAGNOSIS — R9089 Other abnormal findings on diagnostic imaging of central nervous system: Secondary | ICD-10-CM | POA: Diagnosis not present

## 2023-01-29 DIAGNOSIS — Y92129 Unspecified place in nursing home as the place of occurrence of the external cause: Secondary | ICD-10-CM | POA: Diagnosis not present

## 2023-01-29 DIAGNOSIS — W19XXXA Unspecified fall, initial encounter: Secondary | ICD-10-CM | POA: Insufficient documentation

## 2023-01-29 DIAGNOSIS — M25551 Pain in right hip: Secondary | ICD-10-CM | POA: Insufficient documentation

## 2023-01-29 DIAGNOSIS — S199XXA Unspecified injury of neck, initial encounter: Secondary | ICD-10-CM | POA: Diagnosis not present

## 2023-01-29 DIAGNOSIS — S3210XD Unspecified fracture of sacrum, subsequent encounter for fracture with routine healing: Secondary | ICD-10-CM | POA: Diagnosis not present

## 2023-01-29 DIAGNOSIS — M51369 Other intervertebral disc degeneration, lumbar region without mention of lumbar back pain or lower extremity pain: Secondary | ICD-10-CM | POA: Diagnosis not present

## 2023-01-29 DIAGNOSIS — M47816 Spondylosis without myelopathy or radiculopathy, lumbar region: Secondary | ICD-10-CM | POA: Diagnosis not present

## 2023-01-29 DIAGNOSIS — R2689 Other abnormalities of gait and mobility: Secondary | ICD-10-CM | POA: Diagnosis not present

## 2023-01-29 DIAGNOSIS — R410 Disorientation, unspecified: Secondary | ICD-10-CM | POA: Insufficient documentation

## 2023-01-29 DIAGNOSIS — R9431 Abnormal electrocardiogram [ECG] [EKG]: Secondary | ICD-10-CM | POA: Diagnosis not present

## 2023-01-29 DIAGNOSIS — I10 Essential (primary) hypertension: Secondary | ICD-10-CM | POA: Diagnosis not present

## 2023-01-29 DIAGNOSIS — Z9181 History of falling: Secondary | ICD-10-CM | POA: Diagnosis not present

## 2023-01-29 DIAGNOSIS — S0990XA Unspecified injury of head, initial encounter: Secondary | ICD-10-CM | POA: Diagnosis not present

## 2023-01-29 DIAGNOSIS — M549 Dorsalgia, unspecified: Secondary | ICD-10-CM | POA: Diagnosis not present

## 2023-01-29 DIAGNOSIS — M25552 Pain in left hip: Secondary | ICD-10-CM | POA: Diagnosis not present

## 2023-01-29 DIAGNOSIS — M4316 Spondylolisthesis, lumbar region: Secondary | ICD-10-CM | POA: Diagnosis not present

## 2023-01-29 DIAGNOSIS — M25572 Pain in left ankle and joints of left foot: Secondary | ICD-10-CM | POA: Diagnosis not present

## 2023-01-29 DIAGNOSIS — Z043 Encounter for examination and observation following other accident: Secondary | ICD-10-CM | POA: Diagnosis not present

## 2023-01-29 HISTORY — DX: Dementia in other diseases classified elsewhere, unspecified severity, without behavioral disturbance, psychotic disturbance, mood disturbance, and anxiety: F02.80

## 2023-01-29 NOTE — ED Triage Notes (Signed)
Patient comes from [place] by [facility] after and unwitnessed fall.  No LOC, No blood thinners.  Patient was supine on floor, calling for help.  Patient complains of right hip pain.  She has chronic hip pain, but is normally able to ambulate.  She has some rotation of the right leg now, and severe pain on movement.

## 2023-01-30 ENCOUNTER — Emergency Department: Payer: PPO

## 2023-01-30 DIAGNOSIS — M549 Dorsalgia, unspecified: Secondary | ICD-10-CM | POA: Diagnosis not present

## 2023-01-30 DIAGNOSIS — M25551 Pain in right hip: Secondary | ICD-10-CM | POA: Diagnosis not present

## 2023-01-30 DIAGNOSIS — M25552 Pain in left hip: Secondary | ICD-10-CM | POA: Diagnosis not present

## 2023-01-30 DIAGNOSIS — M47816 Spondylosis without myelopathy or radiculopathy, lumbar region: Secondary | ICD-10-CM | POA: Diagnosis not present

## 2023-01-30 DIAGNOSIS — M4316 Spondylolisthesis, lumbar region: Secondary | ICD-10-CM | POA: Diagnosis not present

## 2023-01-30 DIAGNOSIS — Z043 Encounter for examination and observation following other accident: Secondary | ICD-10-CM | POA: Diagnosis not present

## 2023-01-30 DIAGNOSIS — S3210XD Unspecified fracture of sacrum, subsequent encounter for fracture with routine healing: Secondary | ICD-10-CM | POA: Diagnosis not present

## 2023-01-30 DIAGNOSIS — M51369 Other intervertebral disc degeneration, lumbar region without mention of lumbar back pain or lower extremity pain: Secondary | ICD-10-CM | POA: Diagnosis not present

## 2023-01-30 MED ORDER — LACTULOSE 10 GM/15ML PO SOLN
10.0000 g | Freq: Once | ORAL | Status: AC
  Administered 2023-01-30: 10 g via ORAL

## 2023-01-30 MED ORDER — FENTANYL CITRATE PF 50 MCG/ML IJ SOSY: 50.0000 ug | PREFILLED_SYRINGE | Freq: Once | INTRAMUSCULAR | Status: DC

## 2023-01-30 MED ORDER — HYDROCODONE-ACETAMINOPHEN 5-325 MG PO TABS
1.0000 | ORAL_TABLET | Freq: Once | ORAL | Status: AC
  Administered 2023-01-30: 1 via ORAL
  Filled 2023-01-30: qty 1

## 2023-01-30 NOTE — ED Provider Notes (Signed)
Patient received in signout from Dr. Rosalia Hammers pending follow-up communication with facility to ensure that they have staffing and resources to care for patient.  She is appropriate for discharge back to their facility and they are comfortable with receiving her.   Willy Eddy, MD 01/30/23 [phone]

## 2023-01-30 NOTE — ED Notes (Signed)
Patient requested to get up and use the toilet, but is again unable to bear weight on her right side.  She says it hurts a lot when she moves her leg.

## 2023-01-30 NOTE — ED Notes (Signed)
At the direction of Dr. Rosalia Hammers, a call was placed to the nurse's station at [facility] to inquire about the ability of the facility to accommodate the patient if she is unable to bear weight.  Judeth Cornfield said that supervisors Whitney Post or Pam would need to make a decision about a non-weightbearing patient returning.  Neither would not be in until after 0800 this morning.  Judeth Cornfield said she would put in her notes that the x-ray and CT were negative for fractures, but it would still be up to the supervisor.

## 2023-01-30 NOTE — ED Notes (Signed)
This tech assisted pt using the bedpan. Pt voided and peri care was done. Pt repositioned in bed and call button put within reach.

## 2023-01-30 NOTE — ED Provider Notes (Signed)
[facility] Provider Note    Event Date/Time   First MD Initiated Contact with Patient 01/29/23 2257     (approximate)   History   Fall   HPI  Stacey Warner is a 89 year old female presenting to the emergency department for evaluation after an unwitnessed fall.  Patient resides in assisted living memory care at [facility].  There, patient was found on the floor calling for help.  She reported right hip pain.  Facility reported a history of chronic hip pain, but reports that patient is normally ambulatory.  Patient confused on my evaluation, initially denied pain anywhere, later reports right hip pain.    Physical Exam   Triage Vital Signs: ED Triage Vitals  Encounter Vitals Group     BP 01/29/23 2244 (!) 148/70     Systolic BP Percentile --      Diastolic BP Percentile --      Pulse Rate 01/29/23 2244 68     Resp 01/29/23 2244 16     Temp 01/29/23 2244 97.6 F (36.4 C)     Temp Source 01/29/23 2244 Oral     SpO2 01/29/23 2244 100 %     Weight --      Height --      Head Circumference --      Peak Flow --      Pain Score 01/30/23 0231 Asleep     Pain Loc --      Pain Education --      Exclude from Growth Chart --     Most recent vital signs: Vitals:   01/30/23 0320 01/30/23 0530  BP: (!) 173/83 (!) 156/90  Pulse: 63 (!) 58  Resp: 17 14  Temp: 97.8 F (36.6 C)   SpO2: 96% 99%    Nursing notes and vital signs reviewed.  General: Adult female, laying in bed, awake interactive Head: Atraumatic Chest: Symmetric chest rise, no tenderness to palpation.  Cardiac: Regular rhythm and rate.  Respiratory: Lungs clear to auscultation Abdomen: Soft, nondistended. No tenderness to palpation.  Pelvis: Stable in AP and lateral compression. No tenderness to palpation. MSK: No deformity to bilateral upper and lower extremity.  Does not clearly follow commands.  Some discomfort with range of motion of right hip, otherwise no pain with passive range  of motion. Back: Tenderness to palpation of the lower lumbar spine, remainder of spine nontender Neuro: Alert, oriented to self, confused about situation.  Sensation appears intact in bilateral upper and lower extremities.   Skin: No evidence of burns or lacerations.  ED Results / Procedures / Treatments   Labs (all labs ordered are listed, but only abnormal results are displayed) Labs Reviewed - No data to display   EKG EKG independently reviewed interpreted by myself (ER attending) demonstrates:    RADIOLOGY Imaging independently reviewed and interpreted by myself demonstrates:  CT head without acute bleed CT C-spine without acute fracture Lumbar x-Anneta Rounds and hip x-Corvette Orser without acute fracture CT pelvis without acute fracture, radiology notes subacute sacral fracture and subacute to chronic sacral ala fractures  PROCEDURES:  Critical Care performed: No  Procedures   MEDICATIONS ORDERED IN ED: Medications  lactulose (CHRONULAC) 10 GM/15ML solution 10 g (has no administration in time range)  HYDROcodone-acetaminophen (NORCO/VICODIN) 5-325 MG per tablet 1 tablet (1 tablet Oral Given 01/30/23 1610)     IMPRESSION / MDM / ASSESSMENT AND PLAN / ED COURSE  I reviewed the triage vital signs and the nursing notes.  Differential diagnosis includes, but is not limited to, fracture, dislocation, soft tissue injury, intracranial bleed, spine fracture, no evidence of thoracoabdominal trauma  Patient's presentation is most consistent with acute presentation with potential threat to life or bodily function.  89 year old female presenting to the emergency department for evaluation after a fall.  Imaging without obvious acute injury.  Did have patient attempt to ambulate here and had significant difficulty attempting to bear weight on her right side, so CT pelvis was obtained which also was without acute fracture.  Radiology did note possible fecal impaction.  Patient without GI complaints,  will order some lactulose.  Case was discussed with facility by RN.  They reported that patient potentially could return with her inability to ambulate but this would need to be reviewed with the supervisor who would not be available until 8 AM.  Will trial pain control to see if this assists patient's ability to ambulate.  Signed out to oncoming provider pending further ambulation trial and disposition.    FINAL CLINICAL IMPRESSION(S) / ED DIAGNOSES   Final diagnoses:  Pain of right hip  Fall, initial encounter     Rx / DC Orders   ED Discharge Orders     None        Note:  This document was prepared using Dragon voice recognition software and may include unintentional dictation errors.   Trinna Post, MD 01/30/23 [phone]

## 2023-01-30 NOTE — ED Notes (Signed)
At the request of provider, patient was assisted to a standing position.  Upon standing, patient experienced discomfort on the right side of her pelvis.  When attempting to ambulate, the patient was unable to bear weight on her right side due to the discomfort when landing on her right foot.  Two nurses and a gait belt were used to assist in her ambulation test.  Provider has been notified.

## 2023-01-31 DIAGNOSIS — M25551 Pain in right hip: Secondary | ICD-10-CM | POA: Diagnosis not present

## 2023-02-03 DIAGNOSIS — M25551 Pain in right hip: Secondary | ICD-10-CM | POA: Diagnosis not present

## 2023-02-03 DIAGNOSIS — R350 Frequency of micturition: Secondary | ICD-10-CM | POA: Diagnosis not present

## 2023-02-03 DIAGNOSIS — R030 Elevated blood-pressure reading, without diagnosis of hypertension: Secondary | ICD-10-CM | POA: Diagnosis not present

## 2023-02-06 DIAGNOSIS — F02B3 Dementia in other diseases classified elsewhere, moderate, with mood disturbance: Secondary | ICD-10-CM | POA: Diagnosis not present

## 2023-02-06 DIAGNOSIS — R634 Abnormal weight loss: Secondary | ICD-10-CM | POA: Diagnosis not present

## 2023-02-06 DIAGNOSIS — W19XXXA Unspecified fall, initial encounter: Secondary | ICD-10-CM | POA: Diagnosis not present

## 2023-02-06 DIAGNOSIS — E876 Hypokalemia: Secondary | ICD-10-CM | POA: Diagnosis not present

## 2023-02-06 DIAGNOSIS — G301 Alzheimer's disease with late onset: Secondary | ICD-10-CM | POA: Diagnosis not present

## 2023-02-06 DIAGNOSIS — M6281 Muscle weakness (generalized): Secondary | ICD-10-CM | POA: Diagnosis not present

## 2023-02-11 DIAGNOSIS — F411 Generalized anxiety disorder: Secondary | ICD-10-CM | POA: Diagnosis not present

## 2023-02-11 DIAGNOSIS — G309 Alzheimer's disease, unspecified: Secondary | ICD-10-CM | POA: Diagnosis not present

## 2023-02-11 DIAGNOSIS — F331 Major depressive disorder, recurrent, moderate: Secondary | ICD-10-CM | POA: Diagnosis not present

## 2023-02-13 DIAGNOSIS — Z9181 History of falling: Secondary | ICD-10-CM | POA: Diagnosis not present

## 2023-02-13 DIAGNOSIS — K219 Gastro-esophageal reflux disease without esophagitis: Secondary | ICD-10-CM | POA: Diagnosis not present

## 2023-02-13 DIAGNOSIS — E46 Unspecified protein-calorie malnutrition: Secondary | ICD-10-CM | POA: Diagnosis not present

## 2023-02-13 DIAGNOSIS — Z515 Encounter for palliative care: Secondary | ICD-10-CM | POA: Diagnosis not present

## 2023-02-13 DIAGNOSIS — M6281 Muscle weakness (generalized): Secondary | ICD-10-CM | POA: Diagnosis not present

## 2023-02-13 DIAGNOSIS — D519 Vitamin B12 deficiency anemia, unspecified: Secondary | ICD-10-CM | POA: Diagnosis not present

## 2023-02-13 DIAGNOSIS — E785 Hyperlipidemia, unspecified: Secondary | ICD-10-CM | POA: Diagnosis not present

## 2023-02-13 DIAGNOSIS — F0390 Unspecified dementia without behavioral disturbance: Secondary | ICD-10-CM | POA: Diagnosis not present

## 2023-02-13 DIAGNOSIS — E876 Hypokalemia: Secondary | ICD-10-CM | POA: Diagnosis not present

## 2023-02-13 DIAGNOSIS — H409 Unspecified glaucoma: Secondary | ICD-10-CM | POA: Diagnosis not present

## 2023-02-13 DIAGNOSIS — R633 Feeding difficulties, unspecified: Secondary | ICD-10-CM | POA: Diagnosis not present

## 2023-02-13 DIAGNOSIS — G8929 Other chronic pain: Secondary | ICD-10-CM | POA: Diagnosis not present

## 2023-02-14 DIAGNOSIS — F0390 Unspecified dementia without behavioral disturbance: Secondary | ICD-10-CM | POA: Diagnosis not present

## 2023-02-14 DIAGNOSIS — Z9181 History of falling: Secondary | ICD-10-CM | POA: Diagnosis not present

## 2023-02-14 DIAGNOSIS — J309 Allergic rhinitis, unspecified: Secondary | ICD-10-CM | POA: Diagnosis not present

## 2023-02-14 DIAGNOSIS — E785 Hyperlipidemia, unspecified: Secondary | ICD-10-CM | POA: Diagnosis not present

## 2023-02-14 DIAGNOSIS — G8929 Other chronic pain: Secondary | ICD-10-CM | POA: Diagnosis not present

## 2023-02-17 DIAGNOSIS — Z515 Encounter for palliative care: Secondary | ICD-10-CM | POA: Diagnosis not present

## 2023-02-17 DIAGNOSIS — F0390 Unspecified dementia without behavioral disturbance: Secondary | ICD-10-CM | POA: Diagnosis not present

## 2023-02-25 DIAGNOSIS — F331 Major depressive disorder, recurrent, moderate: Secondary | ICD-10-CM | POA: Diagnosis not present

## 2023-02-25 DIAGNOSIS — F411 Generalized anxiety disorder: Secondary | ICD-10-CM | POA: Diagnosis not present

## 2023-02-27 DIAGNOSIS — Z79899 Other long term (current) drug therapy: Secondary | ICD-10-CM | POA: Diagnosis not present

## 2023-02-27 DIAGNOSIS — R630 Anorexia: Secondary | ICD-10-CM | POA: Diagnosis not present

## 2023-02-27 DIAGNOSIS — R627 Adult failure to thrive: Secondary | ICD-10-CM | POA: Diagnosis not present

## 2023-02-28 DIAGNOSIS — Z9181 History of falling: Secondary | ICD-10-CM | POA: Diagnosis not present

## 2023-02-28 DIAGNOSIS — R2689 Other abnormalities of gait and mobility: Secondary | ICD-10-CM | POA: Diagnosis not present

## 2023-03-02 DIAGNOSIS — M25551 Pain in right hip: Secondary | ICD-10-CM | POA: Diagnosis not present

## 2023-03-03 DIAGNOSIS — F411 Generalized anxiety disorder: Secondary | ICD-10-CM | POA: Diagnosis not present

## 2023-03-03 DIAGNOSIS — F0280 Dementia in other diseases classified elsewhere without behavioral disturbance: Secondary | ICD-10-CM | POA: Diagnosis not present

## 2023-03-03 DIAGNOSIS — F5105 Insomnia due to other mental disorder: Secondary | ICD-10-CM | POA: Diagnosis not present

## 2023-03-03 DIAGNOSIS — F331 Major depressive disorder, recurrent, moderate: Secondary | ICD-10-CM | POA: Diagnosis not present

## 2023-03-03 DIAGNOSIS — G301 Alzheimer's disease with late onset: Secondary | ICD-10-CM | POA: Diagnosis not present

## 2023-03-05 DIAGNOSIS — L2089 Other atopic dermatitis: Secondary | ICD-10-CM | POA: Diagnosis not present

## 2023-03-05 DIAGNOSIS — L853 Xerosis cutis: Secondary | ICD-10-CM | POA: Diagnosis not present

## 2023-03-06 DIAGNOSIS — G8929 Other chronic pain: Secondary | ICD-10-CM | POA: Diagnosis not present

## 2023-03-06 DIAGNOSIS — M549 Dorsalgia, unspecified: Secondary | ICD-10-CM | POA: Diagnosis not present

## 2023-03-10 DIAGNOSIS — H401111 Primary open-angle glaucoma, right eye, mild stage: Secondary | ICD-10-CM | POA: Diagnosis not present

## 2023-03-10 DIAGNOSIS — H401122 Primary open-angle glaucoma, left eye, moderate stage: Secondary | ICD-10-CM | POA: Diagnosis not present

## 2023-03-10 DIAGNOSIS — H353132 Nonexudative age-related macular degeneration, bilateral, intermediate dry stage: Secondary | ICD-10-CM | POA: Diagnosis not present

## 2023-03-24 DIAGNOSIS — F331 Major depressive disorder, recurrent, moderate: Secondary | ICD-10-CM | POA: Diagnosis not present

## 2023-03-24 DIAGNOSIS — M25562 Pain in left knee: Secondary | ICD-10-CM | POA: Diagnosis not present

## 2023-03-24 DIAGNOSIS — M6281 Muscle weakness (generalized): Secondary | ICD-10-CM | POA: Diagnosis not present

## 2023-03-24 DIAGNOSIS — G309 Alzheimer's disease, unspecified: Secondary | ICD-10-CM | POA: Diagnosis not present

## 2023-03-24 DIAGNOSIS — M25561 Pain in right knee: Secondary | ICD-10-CM | POA: Diagnosis not present

## 2023-03-27 DIAGNOSIS — R600 Localized edema: Secondary | ICD-10-CM | POA: Diagnosis not present

## 2023-03-27 DIAGNOSIS — L600 Ingrowing nail: Secondary | ICD-10-CM | POA: Diagnosis not present

## 2023-03-27 DIAGNOSIS — J309 Allergic rhinitis, unspecified: Secondary | ICD-10-CM | POA: Diagnosis not present

## 2023-03-27 DIAGNOSIS — E538 Deficiency of other specified B group vitamins: Secondary | ICD-10-CM | POA: Diagnosis not present

## 2023-03-27 DIAGNOSIS — M2041 Other hammer toe(s) (acquired), right foot: Secondary | ICD-10-CM | POA: Diagnosis not present

## 2023-03-27 DIAGNOSIS — K5900 Constipation, unspecified: Secondary | ICD-10-CM | POA: Diagnosis not present

## 2023-03-27 DIAGNOSIS — L219 Seborrheic dermatitis, unspecified: Secondary | ICD-10-CM | POA: Diagnosis not present

## 2023-03-27 DIAGNOSIS — R5381 Other malaise: Secondary | ICD-10-CM | POA: Diagnosis not present

## 2023-03-27 DIAGNOSIS — H409 Unspecified glaucoma: Secondary | ICD-10-CM | POA: Diagnosis not present

## 2023-03-27 DIAGNOSIS — G894 Chronic pain syndrome: Secondary | ICD-10-CM | POA: Diagnosis not present

## 2023-03-27 DIAGNOSIS — B351 Tinea unguium: Secondary | ICD-10-CM | POA: Diagnosis not present

## 2023-03-27 DIAGNOSIS — M2042 Other hammer toe(s) (acquired), left foot: Secondary | ICD-10-CM | POA: Diagnosis not present

## 2023-03-27 DIAGNOSIS — N3281 Overactive bladder: Secondary | ICD-10-CM | POA: Diagnosis not present

## 2023-03-27 DIAGNOSIS — M79674 Pain in right toe(s): Secondary | ICD-10-CM | POA: Diagnosis not present

## 2023-03-27 DIAGNOSIS — K219 Gastro-esophageal reflux disease without esophagitis: Secondary | ICD-10-CM | POA: Diagnosis not present

## 2023-03-27 DIAGNOSIS — M79675 Pain in left toe(s): Secondary | ICD-10-CM | POA: Diagnosis not present

## 2023-03-28 DIAGNOSIS — M25562 Pain in left knee: Secondary | ICD-10-CM | POA: Diagnosis not present

## 2023-03-28 DIAGNOSIS — M6281 Muscle weakness (generalized): Secondary | ICD-10-CM | POA: Diagnosis not present

## 2023-03-28 DIAGNOSIS — M25561 Pain in right knee: Secondary | ICD-10-CM | POA: Diagnosis not present

## 2023-03-31 DIAGNOSIS — M25561 Pain in right knee: Secondary | ICD-10-CM | POA: Diagnosis not present

## 2023-03-31 DIAGNOSIS — R2689 Other abnormalities of gait and mobility: Secondary | ICD-10-CM | POA: Diagnosis not present

## 2023-03-31 DIAGNOSIS — M25562 Pain in left knee: Secondary | ICD-10-CM | POA: Diagnosis not present

## 2023-03-31 DIAGNOSIS — M6281 Muscle weakness (generalized): Secondary | ICD-10-CM | POA: Diagnosis not present

## 2023-03-31 DIAGNOSIS — Z9181 History of falling: Secondary | ICD-10-CM | POA: Diagnosis not present

## 2023-04-02 DIAGNOSIS — J309 Allergic rhinitis, unspecified: Secondary | ICD-10-CM | POA: Diagnosis not present

## 2023-04-02 DIAGNOSIS — N3281 Overactive bladder: Secondary | ICD-10-CM | POA: Diagnosis not present

## 2023-04-02 DIAGNOSIS — E538 Deficiency of other specified B group vitamins: Secondary | ICD-10-CM | POA: Diagnosis not present

## 2023-04-02 DIAGNOSIS — K5900 Constipation, unspecified: Secondary | ICD-10-CM | POA: Diagnosis not present

## 2023-04-02 DIAGNOSIS — K219 Gastro-esophageal reflux disease without esophagitis: Secondary | ICD-10-CM | POA: Diagnosis not present

## 2023-04-02 DIAGNOSIS — M25551 Pain in right hip: Secondary | ICD-10-CM | POA: Diagnosis not present

## 2023-04-02 DIAGNOSIS — R600 Localized edema: Secondary | ICD-10-CM | POA: Diagnosis not present

## 2023-04-02 DIAGNOSIS — F419 Anxiety disorder, unspecified: Secondary | ICD-10-CM | POA: Diagnosis not present

## 2023-04-02 DIAGNOSIS — M6281 Muscle weakness (generalized): Secondary | ICD-10-CM | POA: Diagnosis not present

## 2023-04-02 DIAGNOSIS — N390 Urinary tract infection, site not specified: Secondary | ICD-10-CM | POA: Diagnosis not present

## 2023-04-02 DIAGNOSIS — R4689 Other symptoms and signs involving appearance and behavior: Secondary | ICD-10-CM | POA: Diagnosis not present

## 2023-04-02 DIAGNOSIS — F03918 Unspecified dementia, unspecified severity, with other behavioral disturbance: Secondary | ICD-10-CM | POA: Diagnosis not present

## 2023-04-02 DIAGNOSIS — G894 Chronic pain syndrome: Secondary | ICD-10-CM | POA: Diagnosis not present

## 2023-04-02 DIAGNOSIS — M25562 Pain in left knee: Secondary | ICD-10-CM | POA: Diagnosis not present

## 2023-04-02 DIAGNOSIS — F32A Depression, unspecified: Secondary | ICD-10-CM | POA: Diagnosis not present

## 2023-04-02 DIAGNOSIS — M25561 Pain in right knee: Secondary | ICD-10-CM | POA: Diagnosis not present

## 2023-04-03 DIAGNOSIS — K219 Gastro-esophageal reflux disease without esophagitis: Secondary | ICD-10-CM | POA: Diagnosis not present

## 2023-04-03 DIAGNOSIS — S41111A Laceration without foreign body of right upper arm, initial encounter: Secondary | ICD-10-CM | POA: Diagnosis not present

## 2023-04-03 DIAGNOSIS — R4689 Other symptoms and signs involving appearance and behavior: Secondary | ICD-10-CM | POA: Diagnosis not present

## 2023-04-03 DIAGNOSIS — R600 Localized edema: Secondary | ICD-10-CM | POA: Diagnosis not present

## 2023-04-03 DIAGNOSIS — K5900 Constipation, unspecified: Secondary | ICD-10-CM | POA: Diagnosis not present

## 2023-04-03 DIAGNOSIS — L219 Seborrheic dermatitis, unspecified: Secondary | ICD-10-CM | POA: Diagnosis not present

## 2023-04-03 DIAGNOSIS — J309 Allergic rhinitis, unspecified: Secondary | ICD-10-CM | POA: Diagnosis not present

## 2023-04-03 DIAGNOSIS — G894 Chronic pain syndrome: Secondary | ICD-10-CM | POA: Diagnosis not present

## 2023-04-03 DIAGNOSIS — N3281 Overactive bladder: Secondary | ICD-10-CM | POA: Diagnosis not present

## 2023-04-03 DIAGNOSIS — H409 Unspecified glaucoma: Secondary | ICD-10-CM | POA: Diagnosis not present

## 2023-04-03 DIAGNOSIS — N390 Urinary tract infection, site not specified: Secondary | ICD-10-CM | POA: Diagnosis not present

## 2023-04-03 DIAGNOSIS — F0390 Unspecified dementia without behavioral disturbance: Secondary | ICD-10-CM | POA: Diagnosis not present

## 2023-04-04 DIAGNOSIS — G309 Alzheimer's disease, unspecified: Secondary | ICD-10-CM | POA: Diagnosis not present

## 2023-04-04 DIAGNOSIS — F411 Generalized anxiety disorder: Secondary | ICD-10-CM | POA: Diagnosis not present

## 2023-04-07 DIAGNOSIS — Z79899 Other long term (current) drug therapy: Secondary | ICD-10-CM | POA: Diagnosis not present

## 2023-04-07 DIAGNOSIS — M6281 Muscle weakness (generalized): Secondary | ICD-10-CM | POA: Diagnosis not present

## 2023-04-07 DIAGNOSIS — E038 Other specified hypothyroidism: Secondary | ICD-10-CM | POA: Diagnosis not present

## 2023-04-07 DIAGNOSIS — M25561 Pain in right knee: Secondary | ICD-10-CM | POA: Diagnosis not present

## 2023-04-07 DIAGNOSIS — M25562 Pain in left knee: Secondary | ICD-10-CM | POA: Diagnosis not present

## 2023-04-07 DIAGNOSIS — E559 Vitamin D deficiency, unspecified: Secondary | ICD-10-CM | POA: Diagnosis not present

## 2023-04-07 DIAGNOSIS — D519 Vitamin B12 deficiency anemia, unspecified: Secondary | ICD-10-CM | POA: Diagnosis not present

## 2023-04-07 DIAGNOSIS — E782 Mixed hyperlipidemia: Secondary | ICD-10-CM | POA: Diagnosis not present

## 2023-04-07 DIAGNOSIS — E119 Type 2 diabetes mellitus without complications: Secondary | ICD-10-CM | POA: Diagnosis not present

## 2023-04-10 DIAGNOSIS — M25562 Pain in left knee: Secondary | ICD-10-CM | POA: Diagnosis not present

## 2023-04-10 DIAGNOSIS — M25561 Pain in right knee: Secondary | ICD-10-CM | POA: Diagnosis not present

## 2023-04-10 DIAGNOSIS — M6281 Muscle weakness (generalized): Secondary | ICD-10-CM | POA: Diagnosis not present

## 2023-04-15 DIAGNOSIS — M6281 Muscle weakness (generalized): Secondary | ICD-10-CM | POA: Diagnosis not present

## 2023-04-15 DIAGNOSIS — M25562 Pain in left knee: Secondary | ICD-10-CM | POA: Diagnosis not present

## 2023-04-15 DIAGNOSIS — M25561 Pain in right knee: Secondary | ICD-10-CM | POA: Diagnosis not present

## 2023-04-18 DIAGNOSIS — M25562 Pain in left knee: Secondary | ICD-10-CM | POA: Diagnosis not present

## 2023-04-18 DIAGNOSIS — M6281 Muscle weakness (generalized): Secondary | ICD-10-CM | POA: Diagnosis not present

## 2023-04-18 DIAGNOSIS — M25561 Pain in right knee: Secondary | ICD-10-CM | POA: Diagnosis not present

## 2023-04-21 ENCOUNTER — Ambulatory Visit: Payer: PPO | Admitting: Urology

## 2023-04-21 DIAGNOSIS — M25562 Pain in left knee: Secondary | ICD-10-CM | POA: Diagnosis not present

## 2023-04-21 DIAGNOSIS — M25561 Pain in right knee: Secondary | ICD-10-CM | POA: Diagnosis not present

## 2023-04-21 DIAGNOSIS — M6281 Muscle weakness (generalized): Secondary | ICD-10-CM | POA: Diagnosis not present

## 2023-04-21 DIAGNOSIS — R7309 Other abnormal glucose: Secondary | ICD-10-CM | POA: Diagnosis not present

## 2023-04-22 DIAGNOSIS — F4325 Adjustment disorder with mixed disturbance of emotions and conduct: Secondary | ICD-10-CM | POA: Diagnosis not present

## 2023-04-24 DIAGNOSIS — E559 Vitamin D deficiency, unspecified: Secondary | ICD-10-CM | POA: Diagnosis not present

## 2023-04-24 DIAGNOSIS — E119 Type 2 diabetes mellitus without complications: Secondary | ICD-10-CM | POA: Diagnosis not present

## 2023-04-24 DIAGNOSIS — F0390 Unspecified dementia without behavioral disturbance: Secondary | ICD-10-CM | POA: Diagnosis not present

## 2023-04-24 DIAGNOSIS — E038 Other specified hypothyroidism: Secondary | ICD-10-CM | POA: Diagnosis not present

## 2023-04-24 DIAGNOSIS — D519 Vitamin B12 deficiency anemia, unspecified: Secondary | ICD-10-CM | POA: Diagnosis not present

## 2023-04-24 DIAGNOSIS — E782 Mixed hyperlipidemia: Secondary | ICD-10-CM | POA: Diagnosis not present

## 2023-04-24 DIAGNOSIS — H409 Unspecified glaucoma: Secondary | ICD-10-CM | POA: Diagnosis not present

## 2023-04-28 DIAGNOSIS — M6281 Muscle weakness (generalized): Secondary | ICD-10-CM | POA: Diagnosis not present

## 2023-04-28 DIAGNOSIS — M25561 Pain in right knee: Secondary | ICD-10-CM | POA: Diagnosis not present

## 2023-04-28 DIAGNOSIS — M25562 Pain in left knee: Secondary | ICD-10-CM | POA: Diagnosis not present

## 2023-04-29 DIAGNOSIS — H409 Unspecified glaucoma: Secondary | ICD-10-CM | POA: Diagnosis not present

## 2023-04-29 DIAGNOSIS — E038 Other specified hypothyroidism: Secondary | ICD-10-CM | POA: Diagnosis not present

## 2023-04-29 DIAGNOSIS — D519 Vitamin B12 deficiency anemia, unspecified: Secondary | ICD-10-CM | POA: Diagnosis not present

## 2023-04-29 DIAGNOSIS — E559 Vitamin D deficiency, unspecified: Secondary | ICD-10-CM | POA: Diagnosis not present

## 2023-04-29 DIAGNOSIS — E119 Type 2 diabetes mellitus without complications: Secondary | ICD-10-CM | POA: Diagnosis not present

## 2023-04-29 DIAGNOSIS — F0390 Unspecified dementia without behavioral disturbance: Secondary | ICD-10-CM | POA: Diagnosis not present

## 2023-04-29 DIAGNOSIS — E782 Mixed hyperlipidemia: Secondary | ICD-10-CM | POA: Diagnosis not present

## 2023-05-01 DIAGNOSIS — Z9181 History of falling: Secondary | ICD-10-CM | POA: Diagnosis not present

## 2023-05-01 DIAGNOSIS — R2689 Other abnormalities of gait and mobility: Secondary | ICD-10-CM | POA: Diagnosis not present

## 2023-05-02 DIAGNOSIS — M6281 Muscle weakness (generalized): Secondary | ICD-10-CM | POA: Diagnosis not present

## 2023-05-02 DIAGNOSIS — M25561 Pain in right knee: Secondary | ICD-10-CM | POA: Diagnosis not present

## 2023-05-02 DIAGNOSIS — M25562 Pain in left knee: Secondary | ICD-10-CM | POA: Diagnosis not present

## 2023-05-03 DIAGNOSIS — M25551 Pain in right hip: Secondary | ICD-10-CM | POA: Diagnosis not present

## 2023-05-05 DIAGNOSIS — M25562 Pain in left knee: Secondary | ICD-10-CM | POA: Diagnosis not present

## 2023-05-05 DIAGNOSIS — M6281 Muscle weakness (generalized): Secondary | ICD-10-CM | POA: Diagnosis not present

## 2023-05-05 DIAGNOSIS — M25561 Pain in right knee: Secondary | ICD-10-CM | POA: Diagnosis not present

## 2023-05-06 DIAGNOSIS — M25561 Pain in right knee: Secondary | ICD-10-CM | POA: Diagnosis not present

## 2023-05-06 DIAGNOSIS — M6281 Muscle weakness (generalized): Secondary | ICD-10-CM | POA: Diagnosis not present

## 2023-05-06 DIAGNOSIS — M25562 Pain in left knee: Secondary | ICD-10-CM | POA: Diagnosis not present

## 2023-05-07 DIAGNOSIS — F02C3 Dementia in other diseases classified elsewhere, severe, with mood disturbance: Secondary | ICD-10-CM | POA: Diagnosis not present

## 2023-05-07 DIAGNOSIS — G309 Alzheimer's disease, unspecified: Secondary | ICD-10-CM | POA: Diagnosis not present

## 2023-05-07 DIAGNOSIS — F02C2 Dementia in other diseases classified elsewhere, severe, with psychotic disturbance: Secondary | ICD-10-CM | POA: Diagnosis not present

## 2023-05-07 DIAGNOSIS — F02C4 Dementia in other diseases classified elsewhere, severe, with anxiety: Secondary | ICD-10-CM | POA: Diagnosis not present

## 2023-05-07 DIAGNOSIS — F02C18 Dementia in other diseases classified elsewhere, severe, with other behavioral disturbance: Secondary | ICD-10-CM | POA: Diagnosis not present

## 2023-05-08 DIAGNOSIS — J309 Allergic rhinitis, unspecified: Secondary | ICD-10-CM | POA: Diagnosis not present

## 2023-05-08 DIAGNOSIS — H409 Unspecified glaucoma: Secondary | ICD-10-CM | POA: Diagnosis not present

## 2023-05-08 DIAGNOSIS — K5900 Constipation, unspecified: Secondary | ICD-10-CM | POA: Diagnosis not present

## 2023-05-08 DIAGNOSIS — B351 Tinea unguium: Secondary | ICD-10-CM | POA: Diagnosis not present

## 2023-05-08 DIAGNOSIS — E538 Deficiency of other specified B group vitamins: Secondary | ICD-10-CM | POA: Diagnosis not present

## 2023-05-08 DIAGNOSIS — G894 Chronic pain syndrome: Secondary | ICD-10-CM | POA: Diagnosis not present

## 2023-05-08 DIAGNOSIS — N3281 Overactive bladder: Secondary | ICD-10-CM | POA: Diagnosis not present

## 2023-05-08 DIAGNOSIS — R443 Hallucinations, unspecified: Secondary | ICD-10-CM | POA: Diagnosis not present

## 2023-05-08 DIAGNOSIS — R600 Localized edema: Secondary | ICD-10-CM | POA: Diagnosis not present

## 2023-05-08 DIAGNOSIS — K219 Gastro-esophageal reflux disease without esophagitis: Secondary | ICD-10-CM | POA: Diagnosis not present

## 2023-05-08 DIAGNOSIS — R5381 Other malaise: Secondary | ICD-10-CM | POA: Diagnosis not present

## 2023-05-08 DIAGNOSIS — F419 Anxiety disorder, unspecified: Secondary | ICD-10-CM | POA: Diagnosis not present

## 2023-05-12 DIAGNOSIS — M6281 Muscle weakness (generalized): Secondary | ICD-10-CM | POA: Diagnosis not present

## 2023-05-12 DIAGNOSIS — M25562 Pain in left knee: Secondary | ICD-10-CM | POA: Diagnosis not present

## 2023-05-12 DIAGNOSIS — M25561 Pain in right knee: Secondary | ICD-10-CM | POA: Diagnosis not present

## 2023-05-16 DIAGNOSIS — M25562 Pain in left knee: Secondary | ICD-10-CM | POA: Diagnosis not present

## 2023-05-16 DIAGNOSIS — M6281 Muscle weakness (generalized): Secondary | ICD-10-CM | POA: Diagnosis not present

## 2023-05-16 DIAGNOSIS — M25561 Pain in right knee: Secondary | ICD-10-CM | POA: Diagnosis not present

## 2023-05-19 DIAGNOSIS — G309 Alzheimer's disease, unspecified: Secondary | ICD-10-CM | POA: Diagnosis not present

## 2023-05-19 DIAGNOSIS — F02C18 Dementia in other diseases classified elsewhere, severe, with other behavioral disturbance: Secondary | ICD-10-CM | POA: Diagnosis not present

## 2023-05-19 DIAGNOSIS — F02C2 Dementia in other diseases classified elsewhere, severe, with psychotic disturbance: Secondary | ICD-10-CM | POA: Diagnosis not present

## 2023-05-19 DIAGNOSIS — F02C3 Dementia in other diseases classified elsewhere, severe, with mood disturbance: Secondary | ICD-10-CM | POA: Diagnosis not present

## 2023-05-19 DIAGNOSIS — F02C4 Dementia in other diseases classified elsewhere, severe, with anxiety: Secondary | ICD-10-CM | POA: Diagnosis not present

## 2023-05-22 DIAGNOSIS — R7309 Other abnormal glucose: Secondary | ICD-10-CM | POA: Diagnosis not present

## 2023-05-29 DIAGNOSIS — K5900 Constipation, unspecified: Secondary | ICD-10-CM | POA: Diagnosis not present

## 2023-05-29 DIAGNOSIS — R2689 Other abnormalities of gait and mobility: Secondary | ICD-10-CM | POA: Diagnosis not present

## 2023-05-29 DIAGNOSIS — K219 Gastro-esophageal reflux disease without esophagitis: Secondary | ICD-10-CM | POA: Diagnosis not present

## 2023-05-29 DIAGNOSIS — I10 Essential (primary) hypertension: Secondary | ICD-10-CM | POA: Diagnosis not present

## 2023-05-29 DIAGNOSIS — G894 Chronic pain syndrome: Secondary | ICD-10-CM | POA: Diagnosis not present

## 2023-05-29 DIAGNOSIS — R5381 Other malaise: Secondary | ICD-10-CM | POA: Diagnosis not present

## 2023-05-29 DIAGNOSIS — F32A Depression, unspecified: Secondary | ICD-10-CM | POA: Diagnosis not present

## 2023-05-29 DIAGNOSIS — F22 Delusional disorders: Secondary | ICD-10-CM | POA: Diagnosis not present

## 2023-05-29 DIAGNOSIS — R443 Hallucinations, unspecified: Secondary | ICD-10-CM | POA: Diagnosis not present

## 2023-05-29 DIAGNOSIS — E538 Deficiency of other specified B group vitamins: Secondary | ICD-10-CM | POA: Diagnosis not present

## 2023-05-29 DIAGNOSIS — H409 Unspecified glaucoma: Secondary | ICD-10-CM | POA: Diagnosis not present

## 2023-05-29 DIAGNOSIS — F03918 Unspecified dementia, unspecified severity, with other behavioral disturbance: Secondary | ICD-10-CM | POA: Diagnosis not present

## 2023-05-29 DIAGNOSIS — Z9181 History of falling: Secondary | ICD-10-CM | POA: Diagnosis not present

## 2023-05-29 DIAGNOSIS — N3281 Overactive bladder: Secondary | ICD-10-CM | POA: Diagnosis not present

## 2023-05-31 DIAGNOSIS — M25551 Pain in right hip: Secondary | ICD-10-CM | POA: Diagnosis not present

## 2023-06-04 DIAGNOSIS — F02C2 Dementia in other diseases classified elsewhere, severe, with psychotic disturbance: Secondary | ICD-10-CM | POA: Diagnosis not present

## 2023-06-04 DIAGNOSIS — F02C3 Dementia in other diseases classified elsewhere, severe, with mood disturbance: Secondary | ICD-10-CM | POA: Diagnosis not present

## 2023-06-04 DIAGNOSIS — F02C18 Dementia in other diseases classified elsewhere, severe, with other behavioral disturbance: Secondary | ICD-10-CM | POA: Diagnosis not present

## 2023-06-04 DIAGNOSIS — F02C4 Dementia in other diseases classified elsewhere, severe, with anxiety: Secondary | ICD-10-CM | POA: Diagnosis not present

## 2023-06-04 DIAGNOSIS — G309 Alzheimer's disease, unspecified: Secondary | ICD-10-CM | POA: Diagnosis not present

## 2023-06-06 DIAGNOSIS — G309 Alzheimer's disease, unspecified: Secondary | ICD-10-CM | POA: Diagnosis not present

## 2023-06-06 DIAGNOSIS — F4325 Adjustment disorder with mixed disturbance of emotions and conduct: Secondary | ICD-10-CM | POA: Diagnosis not present

## 2023-06-09 DIAGNOSIS — Z79899 Other long term (current) drug therapy: Secondary | ICD-10-CM | POA: Diagnosis not present

## 2023-06-12 DIAGNOSIS — I10 Essential (primary) hypertension: Secondary | ICD-10-CM | POA: Diagnosis not present

## 2023-06-12 DIAGNOSIS — K5900 Constipation, unspecified: Secondary | ICD-10-CM | POA: Diagnosis not present

## 2023-06-12 DIAGNOSIS — K219 Gastro-esophageal reflux disease without esophagitis: Secondary | ICD-10-CM | POA: Diagnosis not present

## 2023-06-12 DIAGNOSIS — G894 Chronic pain syndrome: Secondary | ICD-10-CM | POA: Diagnosis not present

## 2023-06-12 DIAGNOSIS — H409 Unspecified glaucoma: Secondary | ICD-10-CM | POA: Diagnosis not present

## 2023-06-12 DIAGNOSIS — E538 Deficiency of other specified B group vitamins: Secondary | ICD-10-CM | POA: Diagnosis not present

## 2023-06-12 DIAGNOSIS — N3281 Overactive bladder: Secondary | ICD-10-CM | POA: Diagnosis not present

## 2023-06-12 DIAGNOSIS — J309 Allergic rhinitis, unspecified: Secondary | ICD-10-CM | POA: Diagnosis not present

## 2023-06-12 DIAGNOSIS — R600 Localized edema: Secondary | ICD-10-CM | POA: Diagnosis not present

## 2023-06-16 DIAGNOSIS — R7309 Other abnormal glucose: Secondary | ICD-10-CM | POA: Diagnosis not present

## 2023-06-16 DIAGNOSIS — E038 Other specified hypothyroidism: Secondary | ICD-10-CM | POA: Diagnosis not present

## 2023-06-16 DIAGNOSIS — E782 Mixed hyperlipidemia: Secondary | ICD-10-CM | POA: Diagnosis not present

## 2023-06-16 DIAGNOSIS — Z79899 Other long term (current) drug therapy: Secondary | ICD-10-CM | POA: Diagnosis not present

## 2023-06-16 DIAGNOSIS — D519 Vitamin B12 deficiency anemia, unspecified: Secondary | ICD-10-CM | POA: Diagnosis not present

## 2023-06-19 DIAGNOSIS — E038 Other specified hypothyroidism: Secondary | ICD-10-CM | POA: Diagnosis not present

## 2023-06-19 DIAGNOSIS — E782 Mixed hyperlipidemia: Secondary | ICD-10-CM | POA: Diagnosis not present

## 2023-06-19 DIAGNOSIS — E119 Type 2 diabetes mellitus without complications: Secondary | ICD-10-CM | POA: Diagnosis not present

## 2023-06-19 DIAGNOSIS — F0390 Unspecified dementia without behavioral disturbance: Secondary | ICD-10-CM | POA: Diagnosis not present

## 2023-06-19 DIAGNOSIS — E559 Vitamin D deficiency, unspecified: Secondary | ICD-10-CM | POA: Diagnosis not present

## 2023-06-19 DIAGNOSIS — H409 Unspecified glaucoma: Secondary | ICD-10-CM | POA: Diagnosis not present

## 2023-06-19 DIAGNOSIS — D519 Vitamin B12 deficiency anemia, unspecified: Secondary | ICD-10-CM | POA: Diagnosis not present

## 2023-06-23 DIAGNOSIS — D519 Vitamin B12 deficiency anemia, unspecified: Secondary | ICD-10-CM | POA: Diagnosis not present

## 2023-06-23 DIAGNOSIS — E782 Mixed hyperlipidemia: Secondary | ICD-10-CM | POA: Diagnosis not present

## 2023-06-23 DIAGNOSIS — Z79899 Other long term (current) drug therapy: Secondary | ICD-10-CM | POA: Diagnosis not present

## 2023-06-23 DIAGNOSIS — R7309 Other abnormal glucose: Secondary | ICD-10-CM | POA: Diagnosis not present

## 2023-06-29 DIAGNOSIS — Z9181 History of falling: Secondary | ICD-10-CM | POA: Diagnosis not present

## 2023-06-29 DIAGNOSIS — R2689 Other abnormalities of gait and mobility: Secondary | ICD-10-CM | POA: Diagnosis not present

## 2023-06-30 DIAGNOSIS — M6281 Muscle weakness (generalized): Secondary | ICD-10-CM | POA: Diagnosis not present

## 2023-06-30 DIAGNOSIS — M25561 Pain in right knee: Secondary | ICD-10-CM | POA: Diagnosis not present

## 2023-06-30 DIAGNOSIS — M25562 Pain in left knee: Secondary | ICD-10-CM | POA: Diagnosis not present

## 2023-07-01 DIAGNOSIS — M25551 Pain in right hip: Secondary | ICD-10-CM | POA: Diagnosis not present

## 2023-07-03 DIAGNOSIS — B351 Tinea unguium: Secondary | ICD-10-CM | POA: Diagnosis not present

## 2023-07-03 DIAGNOSIS — I7091 Generalized atherosclerosis: Secondary | ICD-10-CM | POA: Diagnosis not present

## 2023-07-04 DIAGNOSIS — G309 Alzheimer's disease, unspecified: Secondary | ICD-10-CM | POA: Diagnosis not present

## 2023-07-04 DIAGNOSIS — F02C2 Dementia in other diseases classified elsewhere, severe, with psychotic disturbance: Secondary | ICD-10-CM | POA: Diagnosis not present

## 2023-07-04 DIAGNOSIS — F02C18 Dementia in other diseases classified elsewhere, severe, with other behavioral disturbance: Secondary | ICD-10-CM | POA: Diagnosis not present

## 2023-07-04 DIAGNOSIS — F02C3 Dementia in other diseases classified elsewhere, severe, with mood disturbance: Secondary | ICD-10-CM | POA: Diagnosis not present

## 2023-07-04 DIAGNOSIS — F02C4 Dementia in other diseases classified elsewhere, severe, with anxiety: Secondary | ICD-10-CM | POA: Diagnosis not present

## 2023-07-10 DIAGNOSIS — G894 Chronic pain syndrome: Secondary | ICD-10-CM | POA: Diagnosis not present

## 2023-07-10 DIAGNOSIS — F32A Depression, unspecified: Secondary | ICD-10-CM | POA: Diagnosis not present

## 2023-07-10 DIAGNOSIS — H409 Unspecified glaucoma: Secondary | ICD-10-CM | POA: Diagnosis not present

## 2023-07-10 DIAGNOSIS — E538 Deficiency of other specified B group vitamins: Secondary | ICD-10-CM | POA: Diagnosis not present

## 2023-07-10 DIAGNOSIS — K5900 Constipation, unspecified: Secondary | ICD-10-CM | POA: Diagnosis not present

## 2023-07-10 DIAGNOSIS — F419 Anxiety disorder, unspecified: Secondary | ICD-10-CM | POA: Diagnosis not present

## 2023-07-10 DIAGNOSIS — R600 Localized edema: Secondary | ICD-10-CM | POA: Diagnosis not present

## 2023-07-10 DIAGNOSIS — J309 Allergic rhinitis, unspecified: Secondary | ICD-10-CM | POA: Diagnosis not present

## 2023-07-10 DIAGNOSIS — N3281 Overactive bladder: Secondary | ICD-10-CM | POA: Diagnosis not present

## 2023-07-10 DIAGNOSIS — K219 Gastro-esophageal reflux disease without esophagitis: Secondary | ICD-10-CM | POA: Diagnosis not present

## 2023-07-10 DIAGNOSIS — I10 Essential (primary) hypertension: Secondary | ICD-10-CM | POA: Diagnosis not present

## 2023-07-17 DIAGNOSIS — D519 Vitamin B12 deficiency anemia, unspecified: Secondary | ICD-10-CM | POA: Diagnosis not present

## 2023-07-17 DIAGNOSIS — H409 Unspecified glaucoma: Secondary | ICD-10-CM | POA: Diagnosis not present

## 2023-07-17 DIAGNOSIS — E559 Vitamin D deficiency, unspecified: Secondary | ICD-10-CM | POA: Diagnosis not present

## 2023-07-17 DIAGNOSIS — E038 Other specified hypothyroidism: Secondary | ICD-10-CM | POA: Diagnosis not present

## 2023-07-17 DIAGNOSIS — F0390 Unspecified dementia without behavioral disturbance: Secondary | ICD-10-CM | POA: Diagnosis not present

## 2023-07-17 DIAGNOSIS — E782 Mixed hyperlipidemia: Secondary | ICD-10-CM | POA: Diagnosis not present

## 2023-07-17 DIAGNOSIS — E119 Type 2 diabetes mellitus without complications: Secondary | ICD-10-CM | POA: Diagnosis not present

## 2023-07-26 DIAGNOSIS — D519 Vitamin B12 deficiency anemia, unspecified: Secondary | ICD-10-CM | POA: Diagnosis not present

## 2023-07-26 DIAGNOSIS — E782 Mixed hyperlipidemia: Secondary | ICD-10-CM | POA: Diagnosis not present

## 2023-07-26 DIAGNOSIS — H409 Unspecified glaucoma: Secondary | ICD-10-CM | POA: Diagnosis not present

## 2023-07-26 DIAGNOSIS — E038 Other specified hypothyroidism: Secondary | ICD-10-CM | POA: Diagnosis not present

## 2023-07-26 DIAGNOSIS — E119 Type 2 diabetes mellitus without complications: Secondary | ICD-10-CM | POA: Diagnosis not present

## 2023-07-26 DIAGNOSIS — E559 Vitamin D deficiency, unspecified: Secondary | ICD-10-CM | POA: Diagnosis not present

## 2023-07-26 DIAGNOSIS — F0390 Unspecified dementia without behavioral disturbance: Secondary | ICD-10-CM | POA: Diagnosis not present

## 2023-07-28 DIAGNOSIS — L209 Atopic dermatitis, unspecified: Secondary | ICD-10-CM | POA: Diagnosis not present

## 2023-07-29 DIAGNOSIS — R2689 Other abnormalities of gait and mobility: Secondary | ICD-10-CM | POA: Diagnosis not present

## 2023-07-29 DIAGNOSIS — Z9181 History of falling: Secondary | ICD-10-CM | POA: Diagnosis not present

## 2023-07-31 DIAGNOSIS — M25551 Pain in right hip: Secondary | ICD-10-CM | POA: Diagnosis not present

## 2023-08-04 DIAGNOSIS — F02C4 Dementia in other diseases classified elsewhere, severe, with anxiety: Secondary | ICD-10-CM | POA: Diagnosis not present

## 2023-08-04 DIAGNOSIS — F02C18 Dementia in other diseases classified elsewhere, severe, with other behavioral disturbance: Secondary | ICD-10-CM | POA: Diagnosis not present

## 2023-08-04 DIAGNOSIS — G309 Alzheimer's disease, unspecified: Secondary | ICD-10-CM | POA: Diagnosis not present

## 2023-08-04 DIAGNOSIS — F419 Anxiety disorder, unspecified: Secondary | ICD-10-CM | POA: Diagnosis not present

## 2023-08-07 DIAGNOSIS — R600 Localized edema: Secondary | ICD-10-CM | POA: Diagnosis not present

## 2023-08-07 DIAGNOSIS — E538 Deficiency of other specified B group vitamins: Secondary | ICD-10-CM | POA: Diagnosis not present

## 2023-08-07 DIAGNOSIS — H409 Unspecified glaucoma: Secondary | ICD-10-CM | POA: Diagnosis not present

## 2023-08-07 DIAGNOSIS — G894 Chronic pain syndrome: Secondary | ICD-10-CM | POA: Diagnosis not present

## 2023-08-07 DIAGNOSIS — I10 Essential (primary) hypertension: Secondary | ICD-10-CM | POA: Diagnosis not present

## 2023-08-07 DIAGNOSIS — L209 Atopic dermatitis, unspecified: Secondary | ICD-10-CM | POA: Diagnosis not present

## 2023-08-07 DIAGNOSIS — N3281 Overactive bladder: Secondary | ICD-10-CM | POA: Diagnosis not present

## 2023-08-07 DIAGNOSIS — K5900 Constipation, unspecified: Secondary | ICD-10-CM | POA: Diagnosis not present

## 2023-08-07 DIAGNOSIS — J309 Allergic rhinitis, unspecified: Secondary | ICD-10-CM | POA: Diagnosis not present

## 2023-08-07 DIAGNOSIS — K219 Gastro-esophageal reflux disease without esophagitis: Secondary | ICD-10-CM | POA: Diagnosis not present

## 2023-08-07 DIAGNOSIS — F03918 Unspecified dementia, unspecified severity, with other behavioral disturbance: Secondary | ICD-10-CM | POA: Diagnosis not present

## 2023-08-20 DIAGNOSIS — R7309 Other abnormal glucose: Secondary | ICD-10-CM | POA: Diagnosis not present

## 2023-08-20 DIAGNOSIS — E782 Mixed hyperlipidemia: Secondary | ICD-10-CM | POA: Diagnosis not present

## 2023-08-20 DIAGNOSIS — D519 Vitamin B12 deficiency anemia, unspecified: Secondary | ICD-10-CM | POA: Diagnosis not present

## 2023-08-20 DIAGNOSIS — Z79899 Other long term (current) drug therapy: Secondary | ICD-10-CM | POA: Diagnosis not present

## 2023-08-26 DIAGNOSIS — F0390 Unspecified dementia without behavioral disturbance: Secondary | ICD-10-CM | POA: Diagnosis not present

## 2023-08-26 DIAGNOSIS — E559 Vitamin D deficiency, unspecified: Secondary | ICD-10-CM | POA: Diagnosis not present

## 2023-08-26 DIAGNOSIS — E119 Type 2 diabetes mellitus without complications: Secondary | ICD-10-CM | POA: Diagnosis not present

## 2023-08-26 DIAGNOSIS — E038 Other specified hypothyroidism: Secondary | ICD-10-CM | POA: Diagnosis not present

## 2023-08-26 DIAGNOSIS — D519 Vitamin B12 deficiency anemia, unspecified: Secondary | ICD-10-CM | POA: Diagnosis not present

## 2023-08-26 DIAGNOSIS — H409 Unspecified glaucoma: Secondary | ICD-10-CM | POA: Diagnosis not present

## 2023-08-26 DIAGNOSIS — E782 Mixed hyperlipidemia: Secondary | ICD-10-CM | POA: Diagnosis not present

## 2023-08-31 DIAGNOSIS — M25551 Pain in right hip: Secondary | ICD-10-CM | POA: Diagnosis not present

## 2023-09-04 DIAGNOSIS — N3281 Overactive bladder: Secondary | ICD-10-CM | POA: Diagnosis not present

## 2023-09-04 DIAGNOSIS — R600 Localized edema: Secondary | ICD-10-CM | POA: Diagnosis not present

## 2023-09-04 DIAGNOSIS — J309 Allergic rhinitis, unspecified: Secondary | ICD-10-CM | POA: Diagnosis not present

## 2023-09-04 DIAGNOSIS — E538 Deficiency of other specified B group vitamins: Secondary | ICD-10-CM | POA: Diagnosis not present

## 2023-09-04 DIAGNOSIS — F419 Anxiety disorder, unspecified: Secondary | ICD-10-CM | POA: Diagnosis not present

## 2023-09-04 DIAGNOSIS — F0394 Unspecified dementia, unspecified severity, with anxiety: Secondary | ICD-10-CM | POA: Diagnosis not present

## 2023-09-04 DIAGNOSIS — L209 Atopic dermatitis, unspecified: Secondary | ICD-10-CM | POA: Diagnosis not present

## 2023-09-04 DIAGNOSIS — H409 Unspecified glaucoma: Secondary | ICD-10-CM | POA: Diagnosis not present

## 2023-09-04 DIAGNOSIS — K219 Gastro-esophageal reflux disease without esophagitis: Secondary | ICD-10-CM | POA: Diagnosis not present

## 2023-09-04 DIAGNOSIS — F22 Delusional disorders: Secondary | ICD-10-CM | POA: Diagnosis not present

## 2023-09-04 DIAGNOSIS — F32A Depression, unspecified: Secondary | ICD-10-CM | POA: Diagnosis not present

## 2023-09-04 DIAGNOSIS — K5900 Constipation, unspecified: Secondary | ICD-10-CM | POA: Diagnosis not present

## 2023-09-04 DIAGNOSIS — G894 Chronic pain syndrome: Secondary | ICD-10-CM | POA: Diagnosis not present

## 2023-09-11 DIAGNOSIS — F02C18 Dementia in other diseases classified elsewhere, severe, with other behavioral disturbance: Secondary | ICD-10-CM | POA: Diagnosis not present

## 2023-09-11 DIAGNOSIS — F02C4 Dementia in other diseases classified elsewhere, severe, with anxiety: Secondary | ICD-10-CM | POA: Diagnosis not present

## 2023-09-11 DIAGNOSIS — G309 Alzheimer's disease, unspecified: Secondary | ICD-10-CM | POA: Diagnosis not present

## 2023-09-11 DIAGNOSIS — F419 Anxiety disorder, unspecified: Secondary | ICD-10-CM | POA: Diagnosis not present

## 2023-09-23 DIAGNOSIS — H401122 Primary open-angle glaucoma, left eye, moderate stage: Secondary | ICD-10-CM | POA: Diagnosis not present

## 2023-09-23 DIAGNOSIS — H401111 Primary open-angle glaucoma, right eye, mild stage: Secondary | ICD-10-CM | POA: Diagnosis not present

## 2023-09-23 DIAGNOSIS — H353132 Nonexudative age-related macular degeneration, bilateral, intermediate dry stage: Secondary | ICD-10-CM | POA: Diagnosis not present

## 2023-09-23 DIAGNOSIS — Z961 Presence of intraocular lens: Secondary | ICD-10-CM | POA: Diagnosis not present

## 2023-09-24 DIAGNOSIS — E038 Other specified hypothyroidism: Secondary | ICD-10-CM | POA: Diagnosis not present

## 2023-09-24 DIAGNOSIS — E119 Type 2 diabetes mellitus without complications: Secondary | ICD-10-CM | POA: Diagnosis not present

## 2023-09-24 DIAGNOSIS — F0390 Unspecified dementia without behavioral disturbance: Secondary | ICD-10-CM | POA: Diagnosis not present

## 2023-09-24 DIAGNOSIS — H409 Unspecified glaucoma: Secondary | ICD-10-CM | POA: Diagnosis not present

## 2023-09-24 DIAGNOSIS — D519 Vitamin B12 deficiency anemia, unspecified: Secondary | ICD-10-CM | POA: Diagnosis not present

## 2023-09-24 DIAGNOSIS — E559 Vitamin D deficiency, unspecified: Secondary | ICD-10-CM | POA: Diagnosis not present

## 2023-09-24 DIAGNOSIS — E782 Mixed hyperlipidemia: Secondary | ICD-10-CM | POA: Diagnosis not present

## 2023-09-25 DIAGNOSIS — B351 Tinea unguium: Secondary | ICD-10-CM | POA: Diagnosis not present

## 2023-09-25 DIAGNOSIS — I7091 Generalized atherosclerosis: Secondary | ICD-10-CM | POA: Diagnosis not present

## 2023-09-30 DIAGNOSIS — M25551 Pain in right hip: Secondary | ICD-10-CM | POA: Diagnosis not present

## 2023-10-02 DIAGNOSIS — I10 Essential (primary) hypertension: Secondary | ICD-10-CM | POA: Diagnosis not present

## 2023-10-02 DIAGNOSIS — L209 Atopic dermatitis, unspecified: Secondary | ICD-10-CM | POA: Diagnosis not present

## 2023-10-02 DIAGNOSIS — H409 Unspecified glaucoma: Secondary | ICD-10-CM | POA: Diagnosis not present

## 2023-10-02 DIAGNOSIS — J309 Allergic rhinitis, unspecified: Secondary | ICD-10-CM | POA: Diagnosis not present

## 2023-10-02 DIAGNOSIS — N3281 Overactive bladder: Secondary | ICD-10-CM | POA: Diagnosis not present

## 2023-10-02 DIAGNOSIS — G309 Alzheimer's disease, unspecified: Secondary | ICD-10-CM | POA: Diagnosis not present

## 2023-10-02 DIAGNOSIS — R600 Localized edema: Secondary | ICD-10-CM | POA: Diagnosis not present

## 2023-10-02 DIAGNOSIS — F02C4 Dementia in other diseases classified elsewhere, severe, with anxiety: Secondary | ICD-10-CM | POA: Diagnosis not present

## 2023-10-02 DIAGNOSIS — F419 Anxiety disorder, unspecified: Secondary | ICD-10-CM | POA: Diagnosis not present

## 2023-10-02 DIAGNOSIS — F02C18 Dementia in other diseases classified elsewhere, severe, with other behavioral disturbance: Secondary | ICD-10-CM | POA: Diagnosis not present

## 2023-10-02 DIAGNOSIS — G894 Chronic pain syndrome: Secondary | ICD-10-CM | POA: Diagnosis not present

## 2023-10-02 DIAGNOSIS — K219 Gastro-esophageal reflux disease without esophagitis: Secondary | ICD-10-CM | POA: Diagnosis not present

## 2023-10-02 DIAGNOSIS — E538 Deficiency of other specified B group vitamins: Secondary | ICD-10-CM | POA: Diagnosis not present

## 2023-10-02 DIAGNOSIS — K5900 Constipation, unspecified: Secondary | ICD-10-CM | POA: Diagnosis not present

## 2023-10-06 ENCOUNTER — Ambulatory Visit: Payer: Self-pay | Admitting: Urology

## 2023-10-06 DIAGNOSIS — E782 Mixed hyperlipidemia: Secondary | ICD-10-CM | POA: Diagnosis not present

## 2023-10-06 DIAGNOSIS — R7309 Other abnormal glucose: Secondary | ICD-10-CM | POA: Diagnosis not present

## 2023-10-06 DIAGNOSIS — E038 Other specified hypothyroidism: Secondary | ICD-10-CM | POA: Diagnosis not present

## 2023-10-06 DIAGNOSIS — Z79899 Other long term (current) drug therapy: Secondary | ICD-10-CM | POA: Diagnosis not present

## 2023-10-06 DIAGNOSIS — D519 Vitamin B12 deficiency anemia, unspecified: Secondary | ICD-10-CM | POA: Diagnosis not present

## 2023-10-30 DIAGNOSIS — G894 Chronic pain syndrome: Secondary | ICD-10-CM | POA: Diagnosis not present

## 2023-10-30 DIAGNOSIS — E038 Other specified hypothyroidism: Secondary | ICD-10-CM | POA: Diagnosis not present

## 2023-10-30 DIAGNOSIS — K5900 Constipation, unspecified: Secondary | ICD-10-CM | POA: Diagnosis not present

## 2023-10-30 DIAGNOSIS — F32A Depression, unspecified: Secondary | ICD-10-CM | POA: Diagnosis not present

## 2023-10-30 DIAGNOSIS — D519 Vitamin B12 deficiency anemia, unspecified: Secondary | ICD-10-CM | POA: Diagnosis not present

## 2023-10-30 DIAGNOSIS — I10 Essential (primary) hypertension: Secondary | ICD-10-CM | POA: Diagnosis not present

## 2023-10-30 DIAGNOSIS — N3281 Overactive bladder: Secondary | ICD-10-CM | POA: Diagnosis not present

## 2023-10-30 DIAGNOSIS — L209 Atopic dermatitis, unspecified: Secondary | ICD-10-CM | POA: Diagnosis not present

## 2023-10-30 DIAGNOSIS — F0390 Unspecified dementia without behavioral disturbance: Secondary | ICD-10-CM | POA: Diagnosis not present

## 2023-10-30 DIAGNOSIS — E559 Vitamin D deficiency, unspecified: Secondary | ICD-10-CM | POA: Diagnosis not present

## 2023-10-30 DIAGNOSIS — E119 Type 2 diabetes mellitus without complications: Secondary | ICD-10-CM | POA: Diagnosis not present

## 2023-10-30 DIAGNOSIS — J309 Allergic rhinitis, unspecified: Secondary | ICD-10-CM | POA: Diagnosis not present

## 2023-10-30 DIAGNOSIS — K219 Gastro-esophageal reflux disease without esophagitis: Secondary | ICD-10-CM | POA: Diagnosis not present

## 2023-10-30 DIAGNOSIS — E782 Mixed hyperlipidemia: Secondary | ICD-10-CM | POA: Diagnosis not present

## 2023-10-30 DIAGNOSIS — F419 Anxiety disorder, unspecified: Secondary | ICD-10-CM | POA: Diagnosis not present

## 2023-10-30 DIAGNOSIS — R600 Localized edema: Secondary | ICD-10-CM | POA: Diagnosis not present

## 2023-10-30 DIAGNOSIS — E538 Deficiency of other specified B group vitamins: Secondary | ICD-10-CM | POA: Diagnosis not present

## 2023-10-30 DIAGNOSIS — H409 Unspecified glaucoma: Secondary | ICD-10-CM | POA: Diagnosis not present

## 2023-10-31 DIAGNOSIS — M25551 Pain in right hip: Secondary | ICD-10-CM | POA: Diagnosis not present

## 2023-11-18 DIAGNOSIS — G309 Alzheimer's disease, unspecified: Secondary | ICD-10-CM | POA: Diagnosis not present

## 2023-11-18 DIAGNOSIS — F411 Generalized anxiety disorder: Secondary | ICD-10-CM | POA: Diagnosis not present

## 2023-11-18 DIAGNOSIS — F02C18 Dementia in other diseases classified elsewhere, severe, with other behavioral disturbance: Secondary | ICD-10-CM | POA: Diagnosis not present

## 2023-11-18 DIAGNOSIS — F02C4 Dementia in other diseases classified elsewhere, severe, with anxiety: Secondary | ICD-10-CM | POA: Diagnosis not present

## 2023-11-27 DIAGNOSIS — H409 Unspecified glaucoma: Secondary | ICD-10-CM | POA: Diagnosis not present

## 2023-11-27 DIAGNOSIS — F419 Anxiety disorder, unspecified: Secondary | ICD-10-CM | POA: Diagnosis not present

## 2023-11-27 DIAGNOSIS — K219 Gastro-esophageal reflux disease without esophagitis: Secondary | ICD-10-CM | POA: Diagnosis not present

## 2023-11-27 DIAGNOSIS — K5900 Constipation, unspecified: Secondary | ICD-10-CM | POA: Diagnosis not present

## 2023-11-27 DIAGNOSIS — F32A Depression, unspecified: Secondary | ICD-10-CM | POA: Diagnosis not present

## 2023-11-27 DIAGNOSIS — G894 Chronic pain syndrome: Secondary | ICD-10-CM | POA: Diagnosis not present

## 2023-11-27 DIAGNOSIS — I10 Essential (primary) hypertension: Secondary | ICD-10-CM | POA: Diagnosis not present

## 2023-11-27 DIAGNOSIS — E782 Mixed hyperlipidemia: Secondary | ICD-10-CM | POA: Diagnosis not present

## 2023-11-27 DIAGNOSIS — D519 Vitamin B12 deficiency anemia, unspecified: Secondary | ICD-10-CM | POA: Diagnosis not present

## 2023-11-27 DIAGNOSIS — J309 Allergic rhinitis, unspecified: Secondary | ICD-10-CM | POA: Diagnosis not present

## 2023-11-27 DIAGNOSIS — R600 Localized edema: Secondary | ICD-10-CM | POA: Diagnosis not present

## 2023-11-27 DIAGNOSIS — F0390 Unspecified dementia without behavioral disturbance: Secondary | ICD-10-CM | POA: Diagnosis not present

## 2023-11-27 DIAGNOSIS — E538 Deficiency of other specified B group vitamins: Secondary | ICD-10-CM | POA: Diagnosis not present

## 2023-11-27 DIAGNOSIS — L209 Atopic dermatitis, unspecified: Secondary | ICD-10-CM | POA: Diagnosis not present

## 2023-11-27 DIAGNOSIS — E038 Other specified hypothyroidism: Secondary | ICD-10-CM | POA: Diagnosis not present

## 2023-11-27 DIAGNOSIS — E559 Vitamin D deficiency, unspecified: Secondary | ICD-10-CM | POA: Diagnosis not present

## 2023-11-27 DIAGNOSIS — E119 Type 2 diabetes mellitus without complications: Secondary | ICD-10-CM | POA: Diagnosis not present

## 2023-11-27 DIAGNOSIS — N3281 Overactive bladder: Secondary | ICD-10-CM | POA: Diagnosis not present

## 2023-12-01 ENCOUNTER — Ambulatory Visit: Admitting: Urology

## 2023-12-01 DIAGNOSIS — M25551 Pain in right hip: Secondary | ICD-10-CM | POA: Diagnosis not present

## 2023-12-09 DIAGNOSIS — R944 Abnormal results of kidney function studies: Secondary | ICD-10-CM | POA: Diagnosis not present

## 2023-12-18 DIAGNOSIS — F02B11 Dementia in other diseases classified elsewhere, moderate, with agitation: Secondary | ICD-10-CM | POA: Diagnosis not present

## 2023-12-18 DIAGNOSIS — G309 Alzheimer's disease, unspecified: Secondary | ICD-10-CM | POA: Diagnosis not present

## 2023-12-18 DIAGNOSIS — F02B4 Dementia in other diseases classified elsewhere, moderate, with anxiety: Secondary | ICD-10-CM | POA: Diagnosis not present

## 2023-12-18 DIAGNOSIS — F411 Generalized anxiety disorder: Secondary | ICD-10-CM | POA: Diagnosis not present

## 2023-12-22 DIAGNOSIS — E038 Other specified hypothyroidism: Secondary | ICD-10-CM | POA: Diagnosis not present

## 2023-12-22 DIAGNOSIS — E782 Mixed hyperlipidemia: Secondary | ICD-10-CM | POA: Diagnosis not present

## 2023-12-22 DIAGNOSIS — D519 Vitamin B12 deficiency anemia, unspecified: Secondary | ICD-10-CM | POA: Diagnosis not present

## 2023-12-22 DIAGNOSIS — R7309 Other abnormal glucose: Secondary | ICD-10-CM | POA: Diagnosis not present

## 2023-12-22 DIAGNOSIS — Z79899 Other long term (current) drug therapy: Secondary | ICD-10-CM | POA: Diagnosis not present

## 2023-12-25 DIAGNOSIS — K219 Gastro-esophageal reflux disease without esophagitis: Secondary | ICD-10-CM | POA: Diagnosis not present

## 2023-12-25 DIAGNOSIS — J309 Allergic rhinitis, unspecified: Secondary | ICD-10-CM | POA: Diagnosis not present

## 2023-12-25 DIAGNOSIS — F32A Depression, unspecified: Secondary | ICD-10-CM | POA: Diagnosis not present

## 2023-12-25 DIAGNOSIS — G894 Chronic pain syndrome: Secondary | ICD-10-CM | POA: Diagnosis not present

## 2023-12-25 DIAGNOSIS — I10 Essential (primary) hypertension: Secondary | ICD-10-CM | POA: Diagnosis not present

## 2023-12-25 DIAGNOSIS — E538 Deficiency of other specified B group vitamins: Secondary | ICD-10-CM | POA: Diagnosis not present

## 2023-12-25 DIAGNOSIS — R600 Localized edema: Secondary | ICD-10-CM | POA: Diagnosis not present

## 2023-12-25 DIAGNOSIS — K5900 Constipation, unspecified: Secondary | ICD-10-CM | POA: Diagnosis not present

## 2023-12-25 DIAGNOSIS — N3281 Overactive bladder: Secondary | ICD-10-CM | POA: Diagnosis not present

## 2023-12-25 DIAGNOSIS — L309 Dermatitis, unspecified: Secondary | ICD-10-CM | POA: Diagnosis not present

## 2023-12-25 DIAGNOSIS — H409 Unspecified glaucoma: Secondary | ICD-10-CM | POA: Diagnosis not present

## 2023-12-29 ENCOUNTER — Ambulatory Visit: Admitting: Urology

## 2023-12-31 DIAGNOSIS — M25551 Pain in right hip: Secondary | ICD-10-CM | POA: Diagnosis not present

## 2024-01-07 ENCOUNTER — Inpatient Hospital Stay

## 2024-01-07 ENCOUNTER — Other Ambulatory Visit: Payer: Self-pay

## 2024-01-07 ENCOUNTER — Emergency Department

## 2024-01-07 ENCOUNTER — Inpatient Hospital Stay
Admission: EM | Admit: 2024-01-07 | Discharge: 2024-01-09 | DRG: 617 | Disposition: A | Discharge status: Skilled Nursing Facility | Attending: Family Medicine | Admitting: Family Medicine

## 2024-01-07 DIAGNOSIS — M1990 Unspecified osteoarthritis, unspecified site: Secondary | ICD-10-CM | POA: Diagnosis present

## 2024-01-07 DIAGNOSIS — F0390 Unspecified dementia without behavioral disturbance: Secondary | ICD-10-CM | POA: Insufficient documentation

## 2024-01-07 DIAGNOSIS — R609 Edema, unspecified: Secondary | ICD-10-CM | POA: Diagnosis not present

## 2024-01-07 DIAGNOSIS — F32A Depression, unspecified: Secondary | ICD-10-CM | POA: Diagnosis not present

## 2024-01-07 DIAGNOSIS — N3281 Overactive bladder: Secondary | ICD-10-CM | POA: Diagnosis not present

## 2024-01-07 DIAGNOSIS — Z882 Allergy status to sulfonamides status: Secondary | ICD-10-CM | POA: Diagnosis not present

## 2024-01-07 DIAGNOSIS — Z79899 Other long term (current) drug therapy: Secondary | ICD-10-CM

## 2024-01-07 DIAGNOSIS — B351 Tinea unguium: Secondary | ICD-10-CM | POA: Diagnosis not present

## 2024-01-07 DIAGNOSIS — L089 Local infection of the skin and subcutaneous tissue, unspecified: Secondary | ICD-10-CM | POA: Diagnosis not present

## 2024-01-07 DIAGNOSIS — Z7952 Long term (current) use of systemic steroids: Secondary | ICD-10-CM | POA: Diagnosis not present

## 2024-01-07 DIAGNOSIS — H409 Unspecified glaucoma: Secondary | ICD-10-CM | POA: Diagnosis present

## 2024-01-07 DIAGNOSIS — Z823 Family history of stroke: Secondary | ICD-10-CM

## 2024-01-07 DIAGNOSIS — G4730 Sleep apnea, unspecified: Secondary | ICD-10-CM | POA: Diagnosis not present

## 2024-01-07 DIAGNOSIS — R410 Disorientation, unspecified: Secondary | ICD-10-CM | POA: Diagnosis not present

## 2024-01-07 DIAGNOSIS — G894 Chronic pain syndrome: Secondary | ICD-10-CM | POA: Diagnosis not present

## 2024-01-07 DIAGNOSIS — Z888 Allergy status to other drugs, medicaments and biological substances status: Secondary | ICD-10-CM | POA: Diagnosis not present

## 2024-01-07 DIAGNOSIS — E1169 Type 2 diabetes mellitus with other specified complication: Principal | ICD-10-CM | POA: Diagnosis present

## 2024-01-07 DIAGNOSIS — G309 Alzheimer's disease, unspecified: Secondary | ICD-10-CM | POA: Diagnosis present

## 2024-01-07 DIAGNOSIS — Z881 Allergy status to other antibiotic agents status: Secondary | ICD-10-CM

## 2024-01-07 DIAGNOSIS — G2401 Drug induced subacute dyskinesia: Secondary | ICD-10-CM | POA: Diagnosis present

## 2024-01-07 DIAGNOSIS — E785 Hyperlipidemia, unspecified: Secondary | ICD-10-CM | POA: Diagnosis not present

## 2024-01-07 DIAGNOSIS — L03115 Cellulitis of right lower limb: Secondary | ICD-10-CM | POA: Diagnosis present

## 2024-01-07 DIAGNOSIS — I10 Essential (primary) hypertension: Secondary | ICD-10-CM | POA: Diagnosis not present

## 2024-01-07 DIAGNOSIS — M86171 Other acute osteomyelitis, right ankle and foot: Secondary | ICD-10-CM | POA: Diagnosis not present

## 2024-01-07 DIAGNOSIS — R5381 Other malaise: Secondary | ICD-10-CM | POA: Diagnosis not present

## 2024-01-07 DIAGNOSIS — M869 Osteomyelitis, unspecified: Secondary | ICD-10-CM | POA: Diagnosis not present

## 2024-01-07 DIAGNOSIS — I252 Old myocardial infarction: Secondary | ICD-10-CM

## 2024-01-07 DIAGNOSIS — Z7401 Bed confinement status: Secondary | ICD-10-CM | POA: Diagnosis not present

## 2024-01-07 DIAGNOSIS — F0280 Dementia in other diseases classified elsewhere without behavioral disturbance: Secondary | ICD-10-CM | POA: Diagnosis not present

## 2024-01-07 DIAGNOSIS — I7091 Generalized atherosclerosis: Secondary | ICD-10-CM | POA: Diagnosis not present

## 2024-01-07 DIAGNOSIS — R600 Localized edema: Secondary | ICD-10-CM | POA: Diagnosis not present

## 2024-01-07 DIAGNOSIS — F0283 Dementia in other diseases classified elsewhere, unspecified severity, with mood disturbance: Secondary | ICD-10-CM | POA: Diagnosis not present

## 2024-01-07 DIAGNOSIS — L03031 Cellulitis of right toe: Secondary | ICD-10-CM | POA: Diagnosis not present

## 2024-01-07 DIAGNOSIS — K219 Gastro-esophageal reflux disease without esophagitis: Secondary | ICD-10-CM | POA: Diagnosis not present

## 2024-01-07 DIAGNOSIS — M86671 Other chronic osteomyelitis, right ankle and foot: Secondary | ICD-10-CM | POA: Diagnosis not present

## 2024-01-07 DIAGNOSIS — Z8673 Personal history of transient ischemic attack (TIA), and cerebral infarction without residual deficits: Secondary | ICD-10-CM | POA: Diagnosis not present

## 2024-01-07 DIAGNOSIS — K5900 Constipation, unspecified: Secondary | ICD-10-CM | POA: Diagnosis not present

## 2024-01-07 DIAGNOSIS — M79674 Pain in right toe(s): Secondary | ICD-10-CM | POA: Diagnosis not present

## 2024-01-07 DIAGNOSIS — J309 Allergic rhinitis, unspecified: Secondary | ICD-10-CM | POA: Diagnosis not present

## 2024-01-07 LAB — CBC WITH DIFFERENTIAL/PLATELET
Abs Immature Granulocytes: 0.03 K/uL (ref 0.00–0.07)
Basophils Absolute: 0 K/uL (ref 0.0–0.1)
Basophils Relative: 0 %
Eosinophils Absolute: 0.3 K/uL (ref 0.0–0.5)
Eosinophils Relative: 4 %
HCT: 31.2 % — ABNORMAL LOW (ref 36.0–46.0)
Hemoglobin: 10.3 g/dL — ABNORMAL LOW (ref 12.0–15.0)
Immature Granulocytes: 1 %
Lymphocytes Relative: 33 %
Lymphs Abs: 2.1 K/uL (ref 0.7–4.0)
MCH: 30.9 pg (ref 26.0–34.0)
MCHC: 33 g/dL (ref 30.0–36.0)
MCV: 93.7 fL (ref 80.0–100.0)
Monocytes Absolute: 0.5 K/uL (ref 0.1–1.0)
Monocytes Relative: 7 %
Neutro Abs: 3.5 K/uL (ref 1.7–7.7)
Neutrophils Relative %: 55 %
Platelets: 209 K/uL (ref 150–400)
RBC: 3.33 MIL/uL — ABNORMAL LOW (ref 3.87–5.11)
RDW: 14 % (ref 11.5–15.5)
WBC: 6.3 K/uL (ref 4.0–10.5)
nRBC: 0 % (ref 0.0–0.2)

## 2024-01-07 LAB — LACTIC ACID, PLASMA
Lactic Acid, Venous: 1.8 mmol/L (ref 0.5–1.9)
Lactic Acid, Venous: 1 mmol/L (ref 0.5–1.9)

## 2024-01-07 LAB — COMPREHENSIVE METABOLIC PANEL WITH GFR
ALT: 13 U/L (ref 0–44)
AST: 19 U/L (ref 15–41)
Albumin: 3.7 g/dL (ref 3.5–5.0)
Alkaline Phosphatase: 98 U/L (ref 38–126)
Anion gap: 10 (ref 5–15)
BUN: 25 mg/dL — ABNORMAL HIGH (ref 8–23)
CO2: 26 mmol/L (ref 22–32)
Calcium: 9 mg/dL (ref 8.9–10.3)
Chloride: 102 mmol/L (ref 98–111)
Creatinine, Ser: 1.14 mg/dL — ABNORMAL HIGH (ref 0.44–1.00)
GFR, Estimated: 46 mL/min — ABNORMAL LOW (ref >60)
Glucose, Bld: 117 mg/dL — ABNORMAL HIGH (ref 70–99)
Potassium: 3.9 mmol/L (ref 3.5–5.1)
Sodium: 138 mmol/L (ref 135–145)
Total Bilirubin: 0.5 mg/dL (ref 0.0–1.2)
Total Protein: 6.8 g/dL (ref 6.5–8.1)

## 2024-01-07 MED ORDER — VANCOMYCIN HCL IN DEXTROSE 1-5 GM/200ML-% IV SOLN: 1000.0000 mg | Freq: Once | INTRAVENOUS | Status: DC

## 2024-01-07 MED ORDER — METRONIDAZOLE 500 MG/100ML IV SOLN
500.0000 mg | Freq: Once | INTRAVENOUS | Status: AC
  Administered 2024-01-07: 500 mg via INTRAVENOUS
  Filled 2024-01-07: qty 100

## 2024-01-07 MED ORDER — SODIUM CHLORIDE 0.9 % IV SOLN: 2.0000 g | INTRAVENOUS | Status: DC

## 2024-01-07 MED ORDER — LORAZEPAM 0.5 MG PO TABS: 0.5000 mg | ORAL_TABLET | Freq: Four times a day (QID) | ORAL | Status: DC | PRN

## 2024-01-07 MED ORDER — FUROSEMIDE 40 MG PO TABS
40.0000 mg | ORAL_TABLET | Freq: Every day | ORAL | Status: DC
  Administered 2024-01-08 – 2024-01-09 (×2): 40 mg via ORAL
  Filled 2024-01-07 (×2): qty 1

## 2024-01-07 MED ORDER — ONDANSETRON HCL 4 MG PO TABS: 4.0000 mg | ORAL_TABLET | Freq: Four times a day (QID) | ORAL | Status: DC | PRN

## 2024-01-07 MED ORDER — ACETAMINOPHEN 325 MG PO TABS
650.0000 mg | ORAL_TABLET | Freq: Four times a day (QID) | ORAL | Status: DC | PRN
  Administered 2024-01-08 (×2): 650 mg via ORAL
  Filled 2024-01-07 (×2): qty 2

## 2024-01-07 MED ORDER — ONDANSETRON HCL 4 MG/2ML IJ SOLN: 4.0000 mg | Freq: Four times a day (QID) | INTRAMUSCULAR | Status: DC | PRN

## 2024-01-07 MED ORDER — FLUDROCORTISONE ACETATE 0.1 MG PO TABS
0.1000 mg | ORAL_TABLET | Freq: Every day | ORAL | Status: DC
  Administered 2024-01-07 – 2024-01-09 (×3): 0.1 mg via ORAL
  Filled 2024-01-07 (×3): qty 1

## 2024-01-07 MED ORDER — TRAMADOL HCL 50 MG PO TABS: 50.0000 mg | ORAL_TABLET | Freq: Four times a day (QID) | ORAL | Status: AC | PRN

## 2024-01-07 MED ORDER — MIRTAZAPINE 15 MG PO TABS
15.0000 mg | ORAL_TABLET | Freq: Every day | ORAL | Status: DC
  Administered 2024-01-07 – 2024-01-08 (×2): 15 mg via ORAL
  Filled 2024-01-07 (×2): qty 1

## 2024-01-07 MED ORDER — MORPHINE SULFATE (PF) 2 MG/ML IV SOLN: 2.0000 mg | INTRAVENOUS | Status: AC | PRN

## 2024-01-07 MED ORDER — HEPARIN SODIUM (PORCINE) 5000 UNIT/ML IJ SOLN: 5000.0000 [IU] | Freq: Three times a day (TID) | INTRAMUSCULAR | Status: DC

## 2024-01-07 MED ORDER — SODIUM CHLORIDE 0.9 % IV SOLN
2.0000 g | INTRAVENOUS | Status: DC
  Administered 2024-01-08: 2 g via INTRAVENOUS
  Filled 2024-01-07: qty 20

## 2024-01-07 MED ORDER — RISPERIDONE 0.5 MG PO TABS
0.2500 mg | ORAL_TABLET | Freq: Two times a day (BID) | ORAL | Status: DC
  Administered 2024-01-07 – 2024-01-09 (×4): 0.25 mg via ORAL
  Filled 2024-01-07 (×5): qty 1

## 2024-01-07 MED ORDER — SENNOSIDES-DOCUSATE SODIUM 8.6-50 MG PO TABS: 1.0000 | ORAL_TABLET | Freq: Every evening | ORAL | Status: DC | PRN

## 2024-01-07 MED ORDER — ENOXAPARIN SODIUM 40 MG/0.4ML IJ SOSY: 40.0000 mg | PREFILLED_SYRINGE | INTRAMUSCULAR | Status: DC

## 2024-01-07 MED ORDER — VANCOMYCIN HCL IN DEXTROSE 1-5 GM/200ML-% IV SOLN: 1000.0000 mg | INTRAVENOUS | Status: DC

## 2024-01-07 MED ORDER — SERTRALINE HCL 50 MG PO TABS
50.0000 mg | ORAL_TABLET | Freq: Every day | ORAL | Status: DC
  Administered 2024-01-07 – 2024-01-09 (×3): 50 mg via ORAL
  Filled 2024-01-07 (×3): qty 1

## 2024-01-07 MED ORDER — POLYETHYLENE GLYCOL 3350 17 G PO PACK
17.0000 g | PACK | Freq: Every day | ORAL | Status: DC
  Administered 2024-01-08 – 2024-01-09 (×2): 17 g via ORAL
  Filled 2024-01-07 (×2): qty 1

## 2024-01-07 MED ORDER — ACETAMINOPHEN 650 MG RE SUPP: 650.0000 mg | Freq: Four times a day (QID) | RECTAL | Status: DC | PRN

## 2024-01-07 MED ORDER — SODIUM CHLORIDE 0.9 % IV SOLN
2.0000 g | Freq: Once | INTRAVENOUS | Status: AC
  Administered 2024-01-07: 2 g via INTRAVENOUS
  Filled 2024-01-07: qty 20

## 2024-01-07 MED ORDER — MONTELUKAST SODIUM 10 MG PO TABS
10.0000 mg | ORAL_TABLET | Freq: Every day | ORAL | Status: DC
  Administered 2024-01-07 – 2024-01-08 (×2): 10 mg via ORAL
  Filled 2024-01-07 (×2): qty 1

## 2024-01-07 MED ORDER — MEMANTINE HCL 5 MG PO TABS: 5.0000 mg | ORAL_TABLET | Freq: Two times a day (BID) | ORAL | Status: DC

## 2024-01-07 NOTE — ED Notes (Signed)
 CALLED NURSE SYDNI INFORMED BED ASSIGNED

## 2024-01-07 NOTE — Assessment & Plan Note (Signed)
 PDMP reviewed Current active prescription includes tramadol 50 mg, quantity of 30 tablets for 30-day supply filled on 12/12/2023; lorazepam 0.5 mg tablet, 15 tablets for 10-day supply filled on 12/08/2023

## 2024-01-07 NOTE — Consult Note (Signed)
 Pharmacy Antibiotic Note  Stacey Warner is a 89 y.o. female admitted on 01/07/2024 with cellulitis.  Pharmacy has been consulted for vancomycin dosing. Patient is also ordered ceftriaxone.  Plan:  Vancomycin 1 g IV q48h --Calculated AUC: 481, Cmin: 10.7 --Scr 1.14, IBW, Vd 0.72 --Daily Scr per protocol --Levels at Css or as clinically indicated  Weight: 58.3 kg (128 lb 9.6 oz)  Temp (24hrs), Avg:97.7 F (36.5 C), Min:97.5 F (36.4 C), Max:97.9 F (36.6 C)  Recent Labs  Lab 01/07/24 1351  WBC 6.3  CREATININE 1.14*  LATICACIDVEN 1.8    CrCl cannot be calculated (Unknown ideal weight.).    Allergies  Allergen Reactions   Amoxicillin-Pot Clavulanate Rash   Ciprofloxacin      Other reaction(s): Unknown Pt doesn't remember   Clindamycin     Other reaction(s): Unknown   Minocycline     Other reaction(s): Unknown Pt states made her feel funny   Sucralfate     Other reaction(s): Other (See Comments) can't take   Sulfa Antibiotics Rash    Had a small area of rash about 30 years ago when she took sulfa, has not taken since.   Tape Rash    Antimicrobials this admission: Ceftriaxone 10/15 >>  Vancomycin 10/15 >>   Dose adjustments this admission: N/A  Microbiology results: 10/15 BCx: pending  Thank you for allowing pharmacy to be a part of this patient's care.  Marolyn KATHEE Mare 01/07/2024 5:13 PM

## 2024-01-07 NOTE — Assessment & Plan Note (Addendum)
 Not taking any medication at this time

## 2024-01-07 NOTE — ED Triage Notes (Signed)
 PT arrives via EMS from [facility]. PT was sent to ED to her her right middle toe and right foot evaluated for an infection. Pt does have a wound, redness, and swelling to right middle toe. She states it has been like this for about 1 week. Pt does have hx of dementia. She is oriented to name, dob, location, unsure current year. This is her reported baseline. She is not a diabetic.

## 2024-01-07 NOTE — Consult Note (Signed)
 PODIATRY CONSULTATION  NAME CHRYSTEN WOULFE MRN [id number] DOB 06-Jan-1933 DOA 01/07/2024   Reason for consult:  Chief Complaint  Patient presents with   Wound Check    Attending/Consulting physician: A. Cox DO  History of present illness: 89 y.o. female with history of dementia, no prior CKD presented with right foot third toe ulceration and redness.  Patient has dementia and therefore she is not able to provide me much of any useful history regarding the ulceration on the right third toe.  She has not seen any podiatrist for this outpatient.  Per report is approximately 75 week old.  She does report some pain in the right foot when I palpate.  She gave me permission to call her sister to discuss further.  I attempted to call went to voicemail.  Past Medical History:  Diagnosis Date   Allergy    Alzheimer dementia (HCC)    Arthritis    Cataract    GERD (gastroesophageal reflux disease)    Glaucoma    Heart murmur    Myocardial infarction (HCC)    Seizures (HCC)    Sleep apnea    Stroke (HCC)        Latest Ref Rng & Units 01/07/2024    1:51 PM 09/12/2022    3:47 PM 08/06/2022   10:38 AM  CBC  WBC 4.0 - 10.5 K/uL 6.3  6.2  7.0   Hemoglobin 12.0 - 15.0 g/dL 89.6  88.7  88.8   Hematocrit 36.0 - 46.0 % 31.2  34.1  33.9   Platelets 150 - 400 K/uL 209  219  232        Latest Ref Rng & Units 01/07/2024    1:51 PM 08/06/2022   10:38 AM 10/18/2021   12:03 PM  BMP  Glucose 70 - 99 mg/dL 882  869  96   BUN 8 - 23 mg/dL 25  25  29    Creatinine 0.44 - 1.00 mg/dL 8.85  9.26  9.12   Sodium 135 - 145 mmol/L 138  137  138   Potassium 3.5 - 5.1 mmol/L 3.9  2.7  4.2   Chloride 98 - 111 mmol/L 102  101  109   CO2 22 - 32 mmol/L 26  25  22    Calcium 8.9 - 10.3 mg/dL 9.0  9.2  9.2       Physical Exam: Lower Extremity Exam  Right third toe with concern for ulceration of the distal lateral aspect with erythema and edema.  Palpable DP and PT pulse on the right foot.  Patient  does have some sensation to the right forefoot though difficult to decipher how much sensation given dementia         ASSESSMENT/PLAN OF CARE 89 y.o. female with PMHx significant for dementia with right third toe cellulitis secondary to right third toe ulceration with concern for possible underlying osteomyelitis.    WBC 6.3 ESR/CRP, pending X-ray right foot: No evidence of acute osteomyelitis  - N.p.o. past midnight for possible amputation of the right third toe tomorrow pending MRI result.  Will need to discuss further with the patient's sister regarding amputation if that is recommended as I am not sure that the patient can provide consent. -MRI right foot without contrast ordered hopeful this will be completed tonight - Continue IV abx broad spectrum pending further culture data - Anticoagulation: Continue per primary recommendation - Wound care: Betadine gauze to the affected toe - WB status: Weightbearing as tolerated  -  Will continue to follow   Thank you for the consult.  Please contact me directly with any questions or concerns.           Marolyn JULIANNA Honour, DPM [facility]    [address] [place], [place] [address]                Office [phone]  Fax [phone]

## 2024-01-07 NOTE — Assessment & Plan Note (Signed)
 Sertraline 50 mg daily  Mirtazapine 15 mg qhs

## 2024-01-07 NOTE — Hospital Course (Addendum)
 Stacey Warner is an 89 year old female with history of dementia, no prior CKD, who presents ED for chief concerns of right toes pain.  Vitals in the ED showed t 97.9, rr 17, heart rate 61, blood pressure 134/73, SpO2 96 percent on room air.  Serum sodium is 138, potassium 2.9, chloride 102, bicarb 26, BUN of 25, serum creatinine 1.14, eGFR 46, nonfasting blood glucose 117, WBC 6.3, hemoglobin 10.3, platelets 209.  Lactic acid 1.8.  ED treatment: Vancomycin per pharmacy, ceftriaxone one-time dose, metronidazole 500 mg IV one-time dose.

## 2024-01-07 NOTE — H&P (Addendum)
 History and Physical   Stacey Warner FMW:[id number] DOB: 29-May-1932 DOA: 01/07/2024  PCP: Auston Reyes BIRCH, MD  Patient coming from: [facility]  I have personally briefly reviewed patient's old medical records in [facility] EMR.  Chief Concern: Right foot pain  HPI: Ms. Stacey Warner is an 89 year old female with history of dementia, no prior CKD, who presents ED for chief concerns of right toes pain.  Vitals in the ED showed t 97.9, rr 17, heart rate 61, blood pressure 134/73, SpO2 96 percent on room air.  Serum sodium is 138, potassium 2.9, chloride 102, bicarb 26, BUN of 25, serum creatinine 1.14, eGFR 46, nonfasting blood glucose 117, WBC 6.3, hemoglobin 10.3, platelets 209.  Lactic acid 1.8.  ED treatment: Vancomycin per pharmacy, ceftriaxone one-time dose, metronidazole 500 mg IV one-time dose.   ----------------------------------- At bedside, patient patient was able to tell me her first and last name.  She states she is 85 and then 87.  She does not know where she is.  She does not know the Calendar year.  She states that her right toe has been bothering her though she was not able to tell me for how long.  She states that she thinks it looks better than it did this morning.  Social history: She currently is at a memory unit.  ROS: Unable to accurately complete due to patient's moderate to advanced dementia  ED Course: Discussed with EDP, patient requiring hospitalization for concerns of osteomyelitis of the right third toe.  Assessment/Plan  Principal Problem:   Cellulitis of right foot Active Problems:   Chronic pain syndrome   Dementia (HCC)   Depression   Assessment and Plan:  * Cellulitis of right foot With concerns for osteomyelitis Continue with vancomycin per pharmacy and ceftriaxone 2 g IV daily to complete a 5-day course Tramadol 50 mg every 6 hours as needed for moderate pain, 2 days ordered; morphine 2 mg IV every 4 hours as needed for  severe pain, 1 day ordered Podiatry has been consulted by ED provider MRI right foot wo contrast ordered and pending at this time N.p.o. after midnight in anticipation of amputation  Depression Sertraline 50 mg daily  Mirtazapine 15 mg qhs  Dementia (HCC) Not taking any medication at this time  Chronic pain syndrome PDMP reviewed Current active prescription includes tramadol 50 mg, quantity of 30 tablets for 30-day supply filled on 12/12/2023; lorazepam 0.5 mg tablet, 15 tablets for 10-day supply filled on 12/08/2023  Chart reviewed.   DVT prophylaxis: Heparin 5000 units subcutaneous every 8 hours Code Status: Full code Diet: Heart healthy Family Communication: sister, Santana Bollman have been updated over the regarding possible toe amputation pending MRI Disposition Plan: Pending clinical course, MRI, possible podiatry evaluation Consults called: Podiatry, pharmacy Admission status: Telemetry medical, inpatient  Past Medical History:  Diagnosis Date   Allergy    Alzheimer dementia (HCC)    Arthritis    Cataract    GERD (gastroesophageal reflux disease)    Glaucoma    Heart murmur    Myocardial infarction (HCC)    Seizures (HCC)    Sleep apnea    Stroke [facility])    Past Surgical History:  Procedure Laterality Date   BREAST BIOPSY Right    CORE W/CLIP X 2 - NEG   Social History:  reports that she has never smoked. She has never been exposed to tobacco smoke. She has never used smokeless tobacco. She reports that she does not  drink alcohol and does not use drugs.  Allergies  Allergen Reactions   Amoxicillin-Pot Clavulanate Rash   Ciprofloxacin      Other reaction(s): Unknown Pt doesn't remember   Clindamycin     Other reaction(s): Unknown   Minocycline     Other reaction(s): Unknown Pt states made her feel funny   Sucralfate     Other reaction(s): Other (See Comments) can't take   Sulfa Antibiotics Rash    Had a small area of rash about 30 years ago when she  took sulfa, has not taken since.   Tape Rash   Family History  Problem Relation Age of Onset   Stroke Mother    Stroke Father    Breast cancer Neg Hx    Family history: Family history reviewed and not pertinent.  Prior to Admission medications   Medication Sig Start Date End Date Taking? Authorizing Provider  diclofenac Sodium (VOLTAREN) 1 % GEL Apply 1 Application topically 4 (four) times daily. 12/13/23  Yes [provider]  furosemide (LASIX) 40 MG tablet Take 40 mg by mouth daily. 12/12/23  Yes [provider]  LORazepam (ATIVAN) 0.5 MG tablet Take 0.5 mg by mouth every 6 (six) hours as needed for anxiety. 12/08/23  Yes [provider]  mirtazapine (REMERON) 15 MG tablet Take 15 mg by mouth at bedtime. 12/12/23  Yes [provider]  Polyethylene Glycol 3350 (PEG 3350) 17 GM/SCOOP POWD Take 17 g by mouth daily. 11/26/23  Yes [provider]  potassium chloride  SA (KLOR-CON  M) 20 MEQ tablet Take 20 mEq by mouth 2 (two) times daily. 12/12/23  Yes [provider]  risperiDONE (RISPERDAL) 0.25 MG tablet Take 0.25 mg by mouth 2 (two) times daily. 12/12/23  Yes [provider]  traMADol (ULTRAM) 50 MG tablet Take 50 mg by mouth daily as needed. 12/12/23  Yes [provider]  acetaminophen  (TYLENOL ) 325 MG tablet Take 650 mg by mouth every 6 (six) hours as needed.    [provider]  azelastine (ASTELIN) 0.1 % nasal spray  08/15/17   [provider]  dorzolamide-timolol (COSOPT) 22.3-6.8 MG/ML ophthalmic solution 1 drop daily in the afternoon. 07/18/17   [provider]  fludrocortisone (FLORINEF) 0.1 MG tablet Take 0.1 mg by mouth daily.    [provider]  fluticasone (FLONASE) 50 MCG/ACT nasal spray 1 spray 2 (two) times daily at 10 AM and 5 PM. 08/27/17   [provider]  lamoTRIgine  (LAMICTAL ) 100 MG tablet Take 100 mg by mouth daily.    [provider]  latanoprost (XALATAN)  0.005 % ophthalmic solution at bedtime. 08/25/17   [provider]  memantine (NAMENDA) 5 MG tablet Take 5 mg by mouth 2 (two) times daily. 09/04/19   [provider]  montelukast (SINGULAIR) 10 MG tablet TAKE ONE TABLET DAILY EACH EVENING PRIOR TO BEDTIME 07/12/17   [provider]  omeprazole (PRILOSEC) 40 MG capsule Take 40 mg by mouth daily.    [provider]  sertraline (ZOLOFT) 50 MG tablet Take 50 mg by mouth daily. 12/27/19   [provider]  solifenacin  (VESICARE ) 5 MG tablet Take 1 tablet (5 mg total) by mouth daily. 09/02/22   Gaston Hamilton, MD   Physical Exam: Vitals:   01/07/24 1331 01/07/24 1657 01/07/24 1709  BP: 134/73 127/69   Pulse: 61 (!) 56   Resp: 17 16   Temp: 97.9 F (36.6 C) (!) 97.5 F (36.4 C)   TempSrc:  Oral  SpO2: 96% 99%   Weight:   58.3 kg   Constitutional: appears frail, weak, chronically ill, poor hygiene Eyes: PERRL, lids and conjunctivae normal ENMT: Mucous membranes are moist. Posterior pharynx clear of any exudate or lesions. Age-appropriate dentition.  Hard of hearing Neck: normal, supple, no masses, no thyromegaly Respiratory: clear to auscultation bilaterally, no wheezing, no crackles. Normal respiratory effort. No accessory muscle use.  Cardiovascular: Regular rate and rhythm, no murmurs / rubs / gallops. No extremity edema. 2+ pedal pulses. No carotid bruits.  Abdomen: no tenderness, no masses palpated, no hepatosplenomegaly. Bowel sounds positive.  Musculoskeletal: no clubbing / cyanosis. No joint deformity upper and lower extremities. Good ROM, no contractures, no atrophy. Normal muscle tone.  Skin:     Neurologic: Sensation intact. Strength 5/5 in all 4.  Psychiatric: Lacks judgment and insight. Alert and oriented x self only. Normal mood.   EKG: not indicated at this time  X-ray on Admission: I personally reviewed and I agree with radiologist reading as below.  DG Foot Complete  Right Result Date: 01/07/2024 CLINICAL DATA:  Pain and swelling of the right third toe. EXAM: RIGHT FOOT COMPLETE - 3+ VIEW COMPARISON:  None Available. FINDINGS: Age related osteoporosis. Mild degenerative changes for age. No fracture or destructive bony changes to suggest osteomyelitis. IMPRESSION: 1. Age related osteoporosis and mild degenerative changes. 2. No acute bony findings. Electronically Signed   By: MYRTIS Stammer M.D.   On: 01/07/2024 14:35   Labs on Admission: I have personally reviewed following labs  CBC: Recent Labs  Lab 01/07/24 1351  WBC 6.3  NEUTROABS 3.5  HGB 10.3*  HCT 31.2*  MCV 93.7  PLT 209   Basic Metabolic Panel: Recent Labs  Lab 01/07/24 1351  NA 138  K 3.9  CL 102  CO2 26  GLUCOSE 117*  BUN 25*  CREATININE 1.14*  CALCIUM 9.0   GFR: CrCl cannot be calculated (Unknown ideal weight.).  Liver Function Tests: Recent Labs  Lab 01/07/24 1351  AST 19  ALT 13  ALKPHOS 98  BILITOT 0.5  PROT 6.8  ALBUMIN 3.7   Urine analysis:    Component Value Date/Time   COLORURINE STRAW (A) 10/18/2021 1518   APPEARANCEUR Cloudy (A) 10/14/2022 1548   LABSPEC 1.008 10/18/2021 1518   PHURINE 6.0 10/18/2021 1518   GLUCOSEU Negative 10/14/2022 1548   HGBUR NEGATIVE 10/18/2021 1518   BILIRUBINUR Negative 10/14/2022 1548   KETONESUR NEGATIVE 10/18/2021 1518   PROTEINUR 2+ (A) 10/14/2022 1548   PROTEINUR NEGATIVE 10/18/2021 1518   NITRITE Negative 10/14/2022 1548   NITRITE NEGATIVE 10/18/2021 1518   LEUKOCYTESUR 1+ (A) 10/14/2022 1548   LEUKOCYTESUR MODERATE (A) 10/18/2021 1518   This document was prepared using Dragon Voice Recognition software and may include unintentional dictation errors.  Dr. Sherre [facility]  If 7PM-7AM, please contact overnight-coverage provider If 7AM-7PM, please contact day attending provider [email or web address]  01/07/2024, 6:53 PM

## 2024-01-07 NOTE — ED Provider Notes (Signed)
 [facility] Provider Note    Event Date/Time   First MD Initiated Contact with Patient 01/07/24 1342     (approximate)   History   Wound Check   HPI  Stacey Warner is a 89 y.o. female past medical history significant for dementia who presents to the emergency department with foot infection.  Patient is unable to provide any history.  Patient presents from [facility] and was sent for the emergency department for right middle toe infection.  It has been present over the past week.  No history of diabetes.  Patient states that it does hurt.     Physical Exam   Triage Vital Signs: ED Triage Vitals  Encounter Vitals Group     BP 01/07/24 1331 134/73     Girls Systolic BP Percentile --      Girls Diastolic BP Percentile --      Boys Systolic BP Percentile --      Boys Diastolic BP Percentile --      Pulse Rate 01/07/24 1331 61     Resp 01/07/24 1331 17     Temp 01/07/24 1331 97.9 F (36.6 C)     Temp src --      SpO2 01/07/24 1331 96 %     Weight --      Height --      Head Circumference --      Peak Flow --      Pain Score 01/07/24 1332 8     Pain Loc --      Pain Education --      Exclude from Growth Chart --     Most recent vital signs: Vitals:   01/07/24 1331 01/07/24 1657  BP: 134/73 127/69  Pulse: 61 (!) 56  Resp: 17 16  Temp: 97.9 F (36.6 C) (!) 97.5 F (36.4 C)  SpO2: 96% 99%    Physical Exam Constitutional:      Appearance: She is well-developed.  HENT:     Head: Atraumatic.  Eyes:     Conjunctiva/sclera: Conjunctivae normal.  Cardiovascular:     Rate and Rhythm: Regular rhythm.     Comments: +2 DP pulses Pulmonary:     Effort: No respiratory distress.  Abdominal:     General: There is no distension.  Musculoskeletal:        General: Normal range of motion.     Cervical back: Normal range of motion.  Skin:    General: Skin is warm.     Capillary Refill: Capillary refill takes less than 2 seconds.      Comments: Significant erythema and tenderness to palpation to the right third toe.  No open wounds.  Appears to have underlying purulence  Neurological:     Mental Status: She is alert. Mental status is at baseline.         IMPRESSION / MDM / ASSESSMENT AND PLAN / ED COURSE  I reviewed the triage vital signs and the nursing notes.  Differential diagnosis including cellulitis, osteomyelitis, necrotizing infection   RADIOLOGY I independently reviewed imaging, my interpretation of imaging: X-ray of the foot with no acute findings concerning for osteomyelitis and no gas in the wound   Labs (all labs ordered are listed, but only abnormal results are displayed) Labs interpreted as -    Labs Reviewed  COMPREHENSIVE METABOLIC PANEL WITH GFR - Abnormal; Notable for the following components:      Result Value   Glucose, Bld 117 (*)  BUN 25 (*)    Creatinine, Ser 1.14 (*)    GFR, Estimated 46 (*)    All other components within normal limits  CBC WITH DIFFERENTIAL/PLATELET - Abnormal; Notable for the following components:   RBC 3.33 (*)    Hemoglobin 10.3 (*)    HCT 31.2 (*)    All other components within normal limits  CULTURE, BLOOD (ROUTINE X 2)  CULTURE, BLOOD (ROUTINE X 2)  LACTIC ACID, PLASMA  LACTIC ACID, PLASMA  BASIC METABOLIC PANEL WITH GFR  CBC    Lab work overall reassuring.  Creatinine at baseline with no significant hyperglycemia.  Has good peripheral pulses.  Significant concern for underlying osteomyelitis versus necrotic wound.  Started on IV antibiotics, does not have any history of diabetes or peripheral vascular disease so do not feel that she needs Pseudomonas coverage.  MRI ordered.  Consulted hospitalist for admission.  Dr. Malvin with podiatry was in the emergency department and have also evaluated the patient in the emergency department.     PROCEDURES:  Critical Care performed: yes  .Critical Care  Performed by: Suzanne Kirsch,  MD Authorized by: Suzanne Kirsch, MD   Critical care provider statement:    Critical care time (minutes):  30   Critical care time was exclusive of:  Separately billable procedures and treating other patients   Critical care was necessary to treat or prevent imminent or life-threatening deterioration of the following conditions: Complicated infection.   Critical care was time spent personally by me on the following activities:  Development of treatment plan with patient or surrogate, discussions with consultants, evaluation of patient's response to treatment, examination of patient, ordering and review of laboratory studies, ordering and review of radiographic studies, ordering and performing treatments and interventions, pulse oximetry, re-evaluation of patient's condition and review of old charts   Patient's presentation is most consistent with acute presentation with potential threat to life or bodily function.   MEDICATIONS ORDERED IN ED: Medications  cefTRIAXone (ROCEPHIN) 2 g in sodium chloride  0.9 % 100 mL IVPB (0 g Intravenous Stopped 01/07/24 1743)    And  metroNIDAZOLE (FLAGYL) IVPB 500 mg (has no administration in time range)    And  vancomycin (VANCOCIN) IVPB 1000 mg/200 mL premix (has no administration in time range)  memantine (NAMENDA) tablet 5 mg (has no administration in time range)  senna-docusate (Senokot-S) tablet 1 tablet (has no administration in time range)  acetaminophen  (TYLENOL ) tablet 650 mg (has no administration in time range)    Or  acetaminophen  (TYLENOL ) suppository 650 mg (has no administration in time range)  ondansetron (ZOFRAN) tablet 4 mg (has no administration in time range)    Or  ondansetron (ZOFRAN) injection 4 mg (has no administration in time range)  cefTRIAXone (ROCEPHIN) 2 g in sodium chloride  0.9 % 100 mL IVPB (has no administration in time range)  traMADol (ULTRAM) tablet 50 mg (has no administration in time range)  morphine (PF) 2 MG/ML  injection 2 mg (has no administration in time range)  vancomycin (VANCOCIN) IVPB 1000 mg/200 mL premix (has no administration in time range)  heparin injection 5,000 Units (has no administration in time range)    FINAL CLINICAL IMPRESSION(S) / ED DIAGNOSES   Final diagnoses:  Toe infection     Rx / DC Orders   ED Discharge Orders     None        Note:  This document was prepared using Dragon voice recognition software and may include unintentional dictation errors.  Suzanne Kirsch, MD 01/07/24 1747

## 2024-01-07 NOTE — ED Notes (Signed)
 CCMD called to admit patient on monitor.

## 2024-01-07 NOTE — Assessment & Plan Note (Addendum)
 With concerns for osteomyelitis Continue with vancomycin per pharmacy and ceftriaxone 2 g IV daily to complete a 5-day course Tramadol 50 mg every 6 hours as needed for moderate pain, 2 days ordered; morphine 2 mg IV every 4 hours as needed for severe pain, 1 day ordered Podiatry has been consulted by ED provider MRI right foot wo contrast ordered and pending at this time N.p.o. after midnight in anticipation of amputation

## 2024-01-07 NOTE — ED Notes (Signed)
 1C called and informed that pt is on the way.

## 2024-01-08 ENCOUNTER — Inpatient Hospital Stay

## 2024-01-08 ENCOUNTER — Inpatient Hospital Stay: Admitting: Anesthesiology

## 2024-01-08 ENCOUNTER — Encounter
Admission: EM | Disposition: A | Discharge status: Skilled Nursing Facility | Payer: Self-pay | Source: Home / Self Care | Attending: Family Medicine

## 2024-01-08 DIAGNOSIS — M869 Osteomyelitis, unspecified: Secondary | ICD-10-CM

## 2024-01-08 DIAGNOSIS — L03115 Cellulitis of right lower limb: Secondary | ICD-10-CM | POA: Diagnosis not present

## 2024-01-08 HISTORY — PX: AMPUTATION TOE: SHX6595

## 2024-01-08 LAB — BASIC METABOLIC PANEL WITH GFR
Anion gap: 11 (ref 5–15)
BUN: 25 mg/dL — ABNORMAL HIGH (ref 8–23)
CO2: 25 mmol/L (ref 22–32)
Calcium: 8.9 mg/dL (ref 8.9–10.3)
Chloride: 105 mmol/L (ref 98–111)
Creatinine, Ser: 0.93 mg/dL (ref 0.44–1.00)
GFR, Estimated: 59 mL/min — ABNORMAL LOW (ref >60)
Glucose, Bld: 88 mg/dL (ref 70–99)
Potassium: 3.6 mmol/L (ref 3.5–5.1)
Sodium: 141 mmol/L (ref 135–145)

## 2024-01-08 LAB — CBC
HCT: 29.3 % — ABNORMAL LOW (ref 36.0–46.0)
Hemoglobin: 9.3 g/dL — ABNORMAL LOW (ref 12.0–15.0)
MCH: 30.1 pg (ref 26.0–34.0)
MCHC: 31.7 g/dL (ref 30.0–36.0)
MCV: 94.8 fL (ref 80.0–100.0)
Platelets: 192 K/uL (ref 150–400)
RBC: 3.09 MIL/uL — ABNORMAL LOW (ref 3.87–5.11)
RDW: 13.9 % (ref 11.5–15.5)
WBC: 5.5 K/uL (ref 4.0–10.5)
nRBC: 0 % (ref 0.0–0.2)

## 2024-01-08 LAB — HEMOGLOBIN A1C
Hgb A1c MFr Bld: 5.5 % (ref 4.8–5.6)
Mean Plasma Glucose: 111.15 mg/dL

## 2024-01-08 LAB — MAGNESIUM: Magnesium: 2.2 mg/dL (ref 1.7–2.4)

## 2024-01-08 SURGERY — AMPUTATION, TOE
Anesthesia: Monitor Anesthesia Care | Site: Toe | Laterality: Right

## 2024-01-08 MED ORDER — VANCOMYCIN HCL IN DEXTROSE 1-5 GM/200ML-% IV SOLN
1000.0000 mg | Freq: Once | INTRAVENOUS | Status: AC
  Administered 2024-01-08: 1000 mg via INTRAVENOUS
  Filled 2024-01-08: qty 200

## 2024-01-08 MED ORDER — ONDANSETRON HCL 4 MG/2ML IJ SOLN
INTRAMUSCULAR | Status: AC
  Filled 2024-01-08: qty 2

## 2024-01-08 MED ORDER — FENTANYL CITRATE (PF) 100 MCG/2ML IJ SOLN
INTRAMUSCULAR | Status: DC | PRN
  Administered 2024-01-08: 25 ug via INTRAVENOUS

## 2024-01-08 MED ORDER — CEFUROXIME AXETIL 500 MG PO TABS
500.0000 mg | ORAL_TABLET | Freq: Two times a day (BID) | ORAL | Status: DC
  Administered 2024-01-09: 500 mg via ORAL
  Filled 2024-01-08 (×2): qty 1

## 2024-01-08 MED ORDER — LIDOCAINE HCL (CARDIAC) PF 100 MG/5ML IV SOSY
PREFILLED_SYRINGE | INTRAVENOUS | Status: DC | PRN
  Administered 2024-01-08: 40 mg via INTRAVENOUS

## 2024-01-08 MED ORDER — VANCOMYCIN HCL IN DEXTROSE 1-5 GM/200ML-% IV SOLN: 1000.0000 mg | INTRAVENOUS | Status: DC

## 2024-01-08 MED ORDER — ONDANSETRON HCL 4 MG/2ML IJ SOLN
INTRAMUSCULAR | Status: DC | PRN
  Administered 2024-01-08: 4 mg via INTRAVENOUS

## 2024-01-08 MED ORDER — ONDANSETRON HCL 4 MG/2ML IJ SOLN: 4.0000 mg | Freq: Once | INTRAMUSCULAR | Status: DC | PRN

## 2024-01-08 MED ORDER — BUPIVACAINE HCL 0.5 % IJ SOLN
INTRAMUSCULAR | Status: DC | PRN
Start: 2024-01-08 — End: 2024-01-08
  Administered 2024-01-08: 5 mL

## 2024-01-08 MED ORDER — METRONIDAZOLE 500 MG PO TABS
500.0000 mg | ORAL_TABLET | Freq: Two times a day (BID) | ORAL | Status: DC
  Administered 2024-01-08 – 2024-01-09 (×2): 500 mg via ORAL
  Filled 2024-01-08 (×2): qty 1

## 2024-01-08 MED ORDER — FENTANYL CITRATE (PF) 100 MCG/2ML IJ SOLN
INTRAMUSCULAR | Status: AC
  Filled 2024-01-08: qty 2

## 2024-01-08 MED ORDER — FENTANYL CITRATE (PF) 100 MCG/2ML IJ SOLN: 25.0000 ug | INTRAMUSCULAR | Status: DC | PRN

## 2024-01-08 MED ORDER — VANCOMYCIN HCL 1250 MG/250ML IV SOLN: 1250.0000 mg | INTRAVENOUS | Status: DC

## 2024-01-08 MED ORDER — SODIUM CHLORIDE 0.9 % IV SOLN: INTRAVENOUS | Status: DC | PRN

## 2024-01-08 MED ORDER — LIDOCAINE HCL 1 % IJ SOLN
INTRAMUSCULAR | Status: DC | PRN
  Administered 2024-01-08: 5 mL

## 2024-01-08 MED ORDER — BUPIVACAINE HCL (PF) 0.5 % IJ SOLN
INTRAMUSCULAR | Status: AC
Start: 2024-01-08 — End: 2024-01-08
  Filled 2024-01-08: qty 30

## 2024-01-08 MED ORDER — LIDOCAINE HCL (PF) 1 % IJ SOLN
INTRAMUSCULAR | Status: AC
  Filled 2024-01-08: qty 30

## 2024-01-08 MED ORDER — PROPOFOL 500 MG/50ML IV EMUL
INTRAVENOUS | Status: DC | PRN
  Administered 2024-01-08: 30 ug/kg/min via INTRAVENOUS

## 2024-01-08 SURGICAL SUPPLY — 47 items
BLADE MED AGGRESSIVE (BLADE) IMPLANT
BLADE OSC/SAGITTAL MD 5.5X18 (BLADE) IMPLANT
BLADE SURG 15 STRL LF DISP TIS (BLADE) ×2 IMPLANT
BLADE SURG MINI STRL (BLADE) IMPLANT
BNDG ELASTIC 4INX 5YD STR LF (GAUZE/BANDAGES/DRESSINGS) ×1 IMPLANT
BNDG ESMARCH 4X12 STRL LF (GAUZE/BANDAGES/DRESSINGS) IMPLANT
BNDG GAUZE DERMACEA FLUFF 4 (GAUZE/BANDAGES/DRESSINGS) ×1 IMPLANT
CNTNR URN SCR LID CUP LEK RST (MISCELLANEOUS) IMPLANT
CUFF TOURN SGL QUICK 12 (TOURNIQUET CUFF) IMPLANT
CUFF TOURN SGL QUICK 18X4 (TOURNIQUET CUFF) IMPLANT
DRAPE FLUOR MINI C-ARM 54X84 (DRAPES) IMPLANT
DRSG EMULSION OIL 3X3 NADH (GAUZE/BANDAGES/DRESSINGS) IMPLANT
DRSG TELFA 3X8 NADH STRL (GAUZE/BANDAGES/DRESSINGS) ×1 IMPLANT
DURAPREP 26ML APPLICATOR (WOUND CARE) IMPLANT
ELECTRODE REM PT RTRN 9FT ADLT (ELECTROSURGICAL) ×1 IMPLANT
GAUZE PAD ABD 8X10 STRL (GAUZE/BANDAGES/DRESSINGS) IMPLANT
GAUZE SPONGE 4X4 12PLY STRL (GAUZE/BANDAGES/DRESSINGS) ×1 IMPLANT
GAUZE STRETCH 2X75IN STRL (MISCELLANEOUS) IMPLANT
GAUZE XEROFORM 1X8 LF (GAUZE/BANDAGES/DRESSINGS) ×1 IMPLANT
GLOVE BIOGEL PI IND STRL 7.5 (GLOVE) ×1 IMPLANT
GLOVE SURG SYN 7.5 PF PI (GLOVE) ×1 IMPLANT
GOWN STRL REUS W/ TWL LRG LVL3 (GOWN DISPOSABLE) IMPLANT
GOWN STRL REUS W/ TWL XL LVL3 (GOWN DISPOSABLE) ×1 IMPLANT
HANDPIECE VERSAJET DEBRIDEMENT (MISCELLANEOUS) IMPLANT
KIT TURNOVER KIT A (KITS) ×1 IMPLANT
LABEL OR SOLS (LABEL) IMPLANT
MANIFOLD NEPTUNE II (INSTRUMENTS) ×1 IMPLANT
NDL FILTER BLUNT 18X1 1/2 (NEEDLE) IMPLANT
NDL HYPO 25X1 1.5 SAFETY (NEEDLE) ×1 IMPLANT
NEEDLE FILTER BLUNT 18X1 1/2 (NEEDLE) IMPLANT
NEEDLE HYPO 25X1 1.5 SAFETY (NEEDLE) ×1 IMPLANT
NS IRRIG 500ML POUR BTL (IV SOLUTION) ×1 IMPLANT
PACK EXTREMITY ARMC (MISCELLANEOUS) ×1 IMPLANT
PAD ABD DERMACEA PRESS 5X9 (GAUZE/BANDAGES/DRESSINGS) IMPLANT
PAD PREP OB/GYN DISP 24X41 (PERSONAL CARE ITEMS) ×1 IMPLANT
PENCIL SMOKE EVACUATOR (MISCELLANEOUS) ×1 IMPLANT
SOL .9 NS 3000ML IRR UROMATIC (IV SOLUTION) IMPLANT
SOLUTION PREP PVP 2OZ (MISCELLANEOUS) IMPLANT
STAPLER SKIN PROX 35W (STAPLE) IMPLANT
STOCKINETTE 48X4 2 PLY STRL (GAUZE/BANDAGES/DRESSINGS) ×1 IMPLANT
SUT MNCRL AB 3-0 PS2 27 (SUTURE) IMPLANT
SUT PROLENE 3 0 PS 2 (SUTURE) ×1 IMPLANT
SWAB CULTURE AMIES ANAERIB BLU (MISCELLANEOUS) IMPLANT
SYR 10ML LL (SYRINGE) ×1 IMPLANT
TIP FAN IRRIG PULSAVAC PLUS (DISPOSABLE) IMPLANT
TRAP FLUID SMOKE EVACUATOR (MISCELLANEOUS) ×1 IMPLANT
WATER STERILE IRR 500ML POUR (IV SOLUTION) IMPLANT

## 2024-01-08 NOTE — Consult Note (Signed)
 Pharmacy Antibiotic Note  Stacey Warner is a 89 y.o. female admitted on 01/07/2024 with cellulitis.  Pharmacy has been consulted for vancomycin dosing. Patient is also ordered ceftriaxone.  10/16: Patient did not received her Vancomycin loading dose that was scheduled yesterday. Will retime the dose to be given today and make adjustments to following maintenance schedule. Renal function has imrpoved (Scr 1.14 >> 0.93). Will continue to monitor.   Plan: -- Vancomycin loading dose 1 g IV x 1  -- Start vancomycin 1250 mg IV q48h following loading dose --Calculated eAUC: 522, Cmin: 10.4 --Scr 0.93, IBW, Vd 0.72 -- Goal AUC 400-550  --Daily Scr per protocol --Levels at Css or as clinically indicated -- Will continue to monitor renal function and signs of clinical improvement  Height: 5' (152.4 cm) Weight: 56.7 kg (125 lb) IBW/kg (Calculated) : 45.5  Temp (24hrs), Avg:97.6 F (36.4 C), Min:97.4 F (36.3 C), Max:98 F (36.7 C)  Recent Labs  Lab 01/07/24 1351 01/07/24 1656 01/08/24 0418  WBC 6.3  --  5.5  CREATININE 1.14*  --  0.93  LATICACIDVEN 1.8 1.0  --     Estimated Creatinine Clearance: 33 mL/min (by C-G formula based on SCr of 0.93 mg/dL).    Allergies  Allergen Reactions   Amoxicillin-Pot Clavulanate Rash   Ciprofloxacin      Other reaction(s): Unknown Pt doesn't remember   Clindamycin     Other reaction(s): Unknown   Minocycline     Other reaction(s): Unknown Pt states made her feel funny   Sucralfate     Other reaction(s): Other (See Comments) can't take   Sulfa Antibiotics Rash    Had a small area of rash about 30 years ago when she took sulfa, has not taken since.   Tape Rash    Antimicrobials this admission: Ceftriaxone 10/15 >>  Vancomycin 10/15 >>   Dose adjustments this admission: N/A  Microbiology results: 10/15 BCx: pending  Thank you for allowing pharmacy to be a part of this patient's care.  Ransom Blanch PGY-1 Pharmacy Resident   [facility]  01/08/2024 10:56 AM

## 2024-01-08 NOTE — Transfer of Care (Signed)
 Immediate Anesthesia Transfer of Care Note  Patient: Stacey Warner  Procedure(s) Performed: AMPUTATION, TOE (Right: Toe)  Patient Location: PACU  Anesthesia Type:MAC  Level of Consciousness: awake and alert   Airway & Oxygen Therapy: Patient Spontanous Breathing and Patient connected to nasal cannula oxygen  Post-op Assessment: Report given to RN and Post -op Vital signs reviewed and stable  Post vital signs: Reviewed and stable  Last Vitals:  Vitals Value Taken Time  BP 132/68 01/08/24 13:32  Temp    Pulse 52 01/08/24 13:39  Resp 10 01/08/24 13:39  SpO2 97 % 01/08/24 13:39  Vitals shown include unfiled device data.  Last Pain:  Vitals:   01/08/24 1238  TempSrc: Temporal  PainSc:          Complications: No notable events documented.

## 2024-01-08 NOTE — Progress Notes (Signed)
 Mobility Specialist - Progress Note   01/08/24 1600  Mobility  Activity Stood at bedside;Ambulated with assistance  Level of Assistance Minimal assist, patient does 75% or more  Assistive Device Front wheel walker  Distance Ambulated (ft) 10 ft  Range of Motion/Exercises Active  Activity Response Tolerated well  Mobility Referral Yes  Mobility visit 1 Mobility  Mobility Specialist Start Time (ACUTE ONLY) 1600  Mobility Specialist Stop Time (ACUTE ONLY) 1617  Mobility Specialist Time Calculation (min) (ACUTE ONLY) 17 min   Pt resting in bed on RA upon entry. Pt STS and ambulates to bathroom MinA with RW. Wiehgtbearing status: as tolerated. Pt pleasantly confused throughout session. Pt returned to bed and left with needs in reach. Bed alarm activated.   Guido Rumble Mobility Specialist 01/08/24, 4:31 PM

## 2024-01-08 NOTE — Progress Notes (Addendum)
 History and Physical Interval Note:  01/08/2024 12:43 PM  Stacey Warner  has presented today for surgery, with the diagnosis of osteomyelitis of  the distal right third toe.  The various methods of treatment have been discussed with the patient and family. After consideration of risks, benefits and other options for treatment, the patient has consented to   Procedure(s): AMPUTATION, TOE (Right) THIRD TOE as a surgical intervention.  The patient's history has been reviewed, patient examined, no change in status, stable for surgery.  I have reviewed the patient's chart and labs.  Questions were answered to the patient's satisfaction.     Marsa FALCON Priti Consoli

## 2024-01-08 NOTE — Progress Notes (Signed)
 Post op shoe provided. Dressing dry, clean, intact.

## 2024-01-08 NOTE — Progress Notes (Signed)
 PROGRESS NOTE    Stacey Warner  FMW:[id number] DOB: [birth year] DOA: 01/07/2024 PCP: Auston Reyes BIRCH, MD  Chief Complaint  Patient presents with   Wound Check    Hospital Course:  Stacey Warner is a 89 year old with Alzheimer dementia, chronic pain syndrome, depression, type 2 diabetes, hyperlipidemia, tardive dyskinesia, who presents to the ED on 10/15 with right toe pain.  In the ED labs and vitals are grossly unremarkable.  Patient was admitted due to concern of osteomyelitis which was later confirmed on MRI.  Podiatry was consulted and plans to take patient for amputation 10/16.  Patient was started on broad-spectrum antibiotics.  Subjective: Patient is seen preoperatively this morning.  She is pleasantly confused.  We discussed the upcoming surgical plan.  She is amendable to this.  She is worried about being able to walk.  She accurately reports that she is at the hospital in [place], cannot report the year or president.  Objective: Vitals:   01/08/24 1345 01/08/24 1400 01/08/24 1415 01/08/24 1532  BP: 122/73 134/69 (!) 150/76 126/77  Pulse: (!) 54 (!) 52 (!) 55 (!) 56  Resp: 10 12 12 17   Temp:    97.7 F (36.5 C)  TempSrc:      SpO2: 98% 97% 99% 97%  Weight:      Height:        Intake/Output Summary (Last 24 hours) at 01/08/2024 1534 Last data filed at 01/08/2024 1315 Gross per 24 hour  Intake 200 ml  Output --  Net 200 ml   Filed Weights   01/07/24 1709 01/07/24 2038  Weight: 58.3 kg 56.7 kg    Examination: General exam: Appears calm and comfortable, NAD  Respiratory system: No work of breathing, symmetric chest wall expansion Cardiovascular system: S1 & S2 heard, RRR.  Gastrointestinal system: Abdomen is nondistended, soft and nontender.  Neuro: Alert and disoriented.  Oriented to self, place, disoriented to president and year.  Assessment & Plan:  Principal Problem:   Cellulitis of right foot Active Problems:   Chronic pain syndrome   Dementia  (HCC)   Depression   Osteomyelitis of third toe of right foot (HCC)    Osteomyelitis right third toe - Currently on broad-spectrum antibiotics - Podiatry completing amputation today - Clean surgical margins expected.  Ideally discharged with Augmentin but patient has extensive allergy profile including Augmentin, ciprofloxacin , clindamycin, minocycline, sulfa antibiotics.  Will discuss with clinical Pharm. - WB AT postop shoe - PT/OT ordered  Diabetes - Hemoglobin A1c ordered and pending - Blood glucose thus far under control.  Does not appear she is on any home meds.  Hyperlipidemia - Continue statin  Alzheimer's dementia Tardive dyskinesia Depression - Continue home meds  Chronic pain syndrome - Receiving tramadol and lorazepam at home - PDMP reviewed  DVT prophylaxis: Heparin when cleared by podiatry   Code Status: Full Code Disposition: Pending PT evals.  Hopefully back to memory care facility tomorrow  Consultants:  Treatment Team:  Consulting Physician: Malvin Marsa FALCON, DPM  Procedures:  R third toe amputation  Antimicrobials:  Anti-infectives (From admission, onward)    Start     Dose/Rate Route Frequency Ordered Stop   01/10/24 1200  vancomycin (VANCOCIN) IVPB 1000 mg/200 mL premix  Status:  Discontinued        1,000 mg 200 mL/hr over 60 Minutes Intravenous Every 48 hours 01/08/24 1051 01/08/24 1052   01/10/24 1200  [MAR Hold]  vancomycin (VANCOREADY) IVPB 1250 mg/250 mL        (  MAR Hold since Thu 01/08/2024 at 1232.Hold Reason: Transfer to a Procedural area)   1,250 mg 166.7 mL/hr over 90 Minutes Intravenous Every 48 hours 01/08/24 1052     01/09/24 1800  vancomycin (VANCOCIN) IVPB 1000 mg/200 mL premix  Status:  Discontinued        1,000 mg 200 mL/hr over 60 Minutes Intravenous Every 48 hours 01/07/24 1715 01/08/24 1051   01/08/24 1145  vancomycin (VANCOCIN) IVPB 1000 mg/200 mL premix        1,000 mg 200 mL/hr over 60 Minutes Intravenous  Once  01/08/24 1051 01/08/24 1307   01/08/24 1000  [MAR Hold]  cefTRIAXone (ROCEPHIN) 2 g in sodium chloride  0.9 % 100 mL IVPB        (MAR Hold since Thu 01/08/2024 at 1232.Hold Reason: Transfer to a Procedural area)   2 g 200 mL/hr over 30 Minutes Intravenous Every 24 hours 01/07/24 1701 01/12/24 0959   01/07/24 1715  cefTRIAXone (ROCEPHIN) 2 g in sodium chloride  0.9 % 100 mL IVPB  Status:  Discontinued        2 g 200 mL/hr over 30 Minutes Intravenous Every 24 hours 01/07/24 1701 01/07/24 1701   01/07/24 1645  cefTRIAXone (ROCEPHIN) 2 g in sodium chloride  0.9 % 100 mL IVPB       Placed in And Linked Group   2 g 200 mL/hr over 30 Minutes Intravenous Once 01/07/24 1643 01/07/24 1743   01/07/24 1645  metroNIDAZOLE (FLAGYL) IVPB 500 mg       Placed in And Linked Group   500 mg 100 mL/hr over 60 Minutes Intravenous  Once 01/07/24 1643 01/07/24 1911   01/07/24 1645  vancomycin (VANCOCIN) IVPB 1000 mg/200 mL premix  Status:  Discontinued       Placed in And Linked Group   1,000 mg 200 mL/hr over 60 Minutes Intravenous  Once 01/07/24 1643 01/08/24 1051       Data Reviewed: I have personally reviewed following labs and imaging studies CBC: Recent Labs  Lab 01/07/24 1351 01/08/24 0418  WBC 6.3 5.5  NEUTROABS 3.5  --   HGB 10.3* 9.3*  HCT 31.2* 29.3*  MCV 93.7 94.8  PLT 209 192   Basic Metabolic Panel: Recent Labs  Lab 01/07/24 1351 01/08/24 0418  NA 138 141  K 3.9 3.6  CL 102 105  CO2 26 25  GLUCOSE 117* 88  BUN 25* 25*  CREATININE 1.14* 0.93  CALCIUM 9.0 8.9  MG  --  2.2   GFR: Estimated Creatinine Clearance: 33 mL/min (by C-G formula based on SCr of 0.93 mg/dL). Liver Function Tests: Recent Labs  Lab 01/07/24 1351  AST 19  ALT 13  ALKPHOS 98  BILITOT 0.5  PROT 6.8  ALBUMIN 3.7   CBG: No results for input(s): GLUCAP in the last 168 hours.  Recent Results (from the past 240 hours)  Blood Cultures x 2 sites     Status: None (Preliminary result)    Collection Time: 01/07/24  4:56 PM   Specimen: BLOOD  Result Value Ref Range Status   Specimen Description BLOOD RIGHT ANTECUBITAL  Final   Special Requests   Final    BOTTLES DRAWN AEROBIC AND ANAEROBIC Blood Culture results may not be optimal due to an inadequate volume of blood received in culture bottles   Culture   Final    NO GROWTH < 12 HOURS Performed at [facility], [address], [place], [place] [address]    Report Status PENDING  Incomplete  Blood Cultures x 2 sites     Status: None (Preliminary result)   Collection Time: 01/07/24  4:56 PM   Specimen: BLOOD  Result Value Ref Range Status   Specimen Description BLOOD LEFT ANTECUBITAL  Final   Special Requests   Final    BOTTLES DRAWN AEROBIC AND ANAEROBIC Blood Culture results may not be optimal due to an inadequate volume of blood received in culture bottles   Culture   Final    NO GROWTH < 12 HOURS Performed at [facility], [address] [place], [place] [address]    Report Status PENDING  Incomplete     Radiology Studies: MRI Right foot without contrast Result Date: 01/07/2024 CLINICAL DATA:  Pain and swelling of the right third toe. EXAM: MRI OF THE RIGHT FOREFOOT WITHOUT CONTRAST TECHNIQUE: Multiplanar, multisequence MR imaging of the right forefoot was performed. No intravenous contrast was administered. COMPARISON:  Right foot radiographs dated 01/07/2024. FINDINGS: Bones/Joint/Cartilage Marrow edema of the tuft of the distal phalanx of the third toe is suspicious for osteomyelitis. No marrow signal abnormality identified elsewhere to suggest acute osteomyelitis. No acute fracture or dislocation. No joint effusion. Mild osteoarthritis of the first MTP joint. Ligaments Collateral ligaments are intact.  Lisfranc ligament is intact. Muscles and Tendons No appreciable acute abnormality. Soft tissue Cutaneous irregularity with suspected wound at the tip of the third toe. No fluid collection.  IMPRESSION: 1. Cutaneous irregularity with suspected wound at the tip of the third toe. Marrow edema of the underlying tuft of the distal phalanx of the third toe is suspicious for acute osteomyelitis. 2. Mild osteoarthritis of the first MTP joint. Electronically Signed   By: Harrietta Sherry M.D.   On: 01/07/2024 20:12   DG Foot Complete Right Result Date: 01/07/2024 CLINICAL DATA:  Pain and swelling of the right third toe. EXAM: RIGHT FOOT COMPLETE - 3+ VIEW COMPARISON:  None Available. FINDINGS: Age related osteoporosis. Mild degenerative changes for age. No fracture or destructive bony changes to suggest osteomyelitis. IMPRESSION: 1. Age related osteoporosis and mild degenerative changes. 2. No acute bony findings. Electronically Signed   By: MYRTIS Stammer M.D.   On: 01/07/2024 14:35    Scheduled Meds:  [MAR Hold] fludrocortisone  0.1 mg Oral Daily   [MAR Hold] furosemide  40 mg Oral Daily   [MAR Hold] mirtazapine  15 mg Oral QHS   [MAR Hold] montelukast  10 mg Oral QHS   [MAR Hold] polyethylene glycol  17 g Oral Daily   [MAR Hold] risperiDONE  0.25 mg Oral BID   [MAR Hold] sertraline  50 mg Oral Daily   Continuous Infusions:  [MAR Hold] cefTRIAXone (ROCEPHIN)  IV 2 g (01/08/24 0841)   [MAR Hold] vancomycin       LOS: 1 day  MDM: Patient is high risk for one or more organ failure.  They necessitate ongoing hospitalization for continued IV therapies and subsequent lab monitoring. Total time spent interpreting labs and vitals, reviewing the medical record, coordinating care amongst consultants and care team members, directly assessing and discussing care with the patient and/or family: 55 min  Jorgen Wolfinger, DO [facility]  To contact the attending physician between 7A-7P please use Epic Chat. To contact the covering physician during after hours 7P-7A, please review Amion.  01/08/2024, 3:34 PM   *This document has been created with the assistance of dictation software. Please  excuse typographical errors. *

## 2024-01-08 NOTE — Plan of Care (Signed)
  Problem: Activity: Goal: Risk for activity intolerance will decrease Outcome: Progressing   Problem: Nutrition: Goal: Adequate nutrition will be maintained Outcome: Progressing   Problem: Elimination: Goal: Will not experience complications related to bowel motility Outcome: Progressing Goal: Will not experience complications related to urinary retention Outcome: Progressing   Problem: Safety: Goal: Ability to remain free from injury will improve Outcome: Progressing   Problem: Skin Integrity: Goal: Risk for impaired skin integrity will decrease Outcome: Progressing   Problem: Clinical Measurements: Goal: Ability to avoid or minimize complications of infection will improve Outcome: Progressing   Problem: Skin Integrity: Goal: Skin integrity will improve Outcome: Progressing

## 2024-01-08 NOTE — TOC Initial Note (Signed)
 Transition of Care [facility]) - Initial/Assessment Note    Patient Details  Name: Stacey Warner MRN: [id number] Date of Birth: 1933-03-19  [facility]) CM/SW Contact:    Dalia GORMAN Fuse, RN Phone Number: 01/08/2024, 11:12 AM  Clinical Narrative:                 Patient on IV antibiotics for cellulitis of R 3rd toe. Blood cultures are pending. Podiatry consulted. The patient is from [facility] [phone]. TOC spoke with Jamie @ the [facility]. The patient is in the memory care unit there. The facility will accept the patient back when she is medically ready for discharge.  TOC will continue to follow.  Expected Discharge Plan: Assisted Living Barriers to Discharge: Continued Medical Work up   Patient Goals and CMS Choice            Expected Discharge Plan and Services   Discharge Planning Services: CM Consult   Living arrangements for the past 2 months: Assisted Living Facility                                      Prior Living Arrangements/Services Living arrangements for the past 2 months: Assisted Living Facility Lives with:: Facility Resident                   Activities of Daily Living   ADL Screening (condition at time of admission) Independently performs ADLs?: Yes (appropriate for developmental age) Is the patient deaf or have difficulty hearing?: Yes Does the patient have difficulty seeing, even when wearing glasses/contacts?: No Does the patient have difficulty concentrating, remembering, or making decisions?: Yes  Permission Sought/Granted                  Emotional Assessment       Orientation: : Oriented to Self      Admission diagnosis:  Cellulitis of right foot [L03.115] Toe infection [L08.9] Patient Active Problem List   Diagnosis Date Noted   Cellulitis of right foot 01/07/2024   Dementia (HCC) 01/07/2024   Depression 01/07/2024   Lumbar spondylosis 10/11/2021   Lumbar degenerative disc disease 10/11/2021    Chronic bilateral low back pain without sciatica 10/11/2021   Chronic pain syndrome 10/11/2021   PCP:  Auston Reyes BIRCH, MD Pharmacy:   [phone] - [place], [place] - [address] [place] [address] Phone: [phone] Fax: [phone]     Social Drivers of Health (SDOH) Social History: SDOH Screenings   Food Insecurity: No Food Insecurity (01/07/2024)  Housing: Patient Declined (01/07/2024)  Transportation Needs: Patient Declined (01/07/2024)  Utilities: Not At Risk (01/07/2024)  Social Connections: Unknown (01/07/2024)  Tobacco Use: Low Risk  (01/07/2024)   SDOH Interventions:     Readmission Risk Interventions     No data to display

## 2024-01-08 NOTE — Op Note (Signed)
 Full Operative Report  Date of Operation: 12:44 PM, 01/08/2024   Patient: Stacey Warner - 89 y.o. female  Surgeon: Malvin Marsa FALCON, DPM   Assistant: None  Diagnosis: osteomyelitis right third toe  Procedure:  1.  Amputation right third toe mid proximal phalanx level    Anesthesia: Monitor Anesthesia Care  No responsible provider has been recorded for the case.  Anesthesiologist: Fleeta Blush, Arno FALCON, MD CRNA: Niki Manus SAUNDERS, CRNA   Estimated Blood Loss: Minimal  Hemostasis: 1) Anatomical dissection, mechanical compression, electrocautery 2) retractors used in procedure  Implants: * No implants in log *  Materials: Prolene 3-0  Injectables: 1) Pre-operatively: 10 cc of 50:50 mixture 1%lidocaine  plain and 0.5% marcaine plain 2) Post-operatively: None   Specimens: - Pathology: Right third toe - Microbiology: Tissue culture distal aspect right third toe   Antibiotics: IV antibiotics given per schedule on the floor  Drains: None  Complications: Patient tolerated the procedure well without complication.   Operative findings: As below in detailed report  Indications for Procedure: Stacey Warner presents to Malvin Marsa FALCON, DPM with a chief complaint of osteomyelitis of right third toe.  The patient has failed conservative treatments of various modalities. At this time the patient has elected to proceed with surgical correction. All alternatives, risks, and complications of the procedures were thoroughly explained to the patient. Patient exhibits appropriate understanding of all discussion points and informed consent was signed and obtained in the chart with no guarantees to surgical outcome given or implied.  Description of Procedure: Patient was brought to the operating room. Patient remained on their hospital bed in the supine position. A surgical timeout was performed and all members of the operating room, the procedure, and the surgical site  were identified. anesthesia occurred as per anesthesia record. Local anesthetic as previously described was then injected about the operative field in a local infiltrative block.  The operative lower extremity as noted above was then prepped and draped in the usual sterile manner. The following procedure then began.  Attention was directed to the third digit on the right foot. A full-thickness incision encompassing the entire digit was made using a #15 blade. Dissection was carried down to bone. The toe was secured with a towel clamp, further dissected in its entirety, and disarticulated at the PIPJ and passed to the back table as a gross specimen. This was then labled and sent to pathology. The bone was noted to be soft and eroded, and consistent with osteomyelitis. All remaining necrotic and devitalized soft tissue structures were visualized and dissected away using sharp and dull dissection. Care was taken to protect all neurovascular structures throughout the dissection. All bleeders were cauterized as necessary. The area was then flushed with copious amounts of sterile saline. Then using the suture materials previously described, the site was closed in anatomic layers and the skin was well approximated under minimal tension.   The surgical site was then dressed with Xeroform 4 x 4 gauze Kerlix Ace roll. The patient tolerated both the procedure and anesthesia well with vital signs stable throughout. The patient was transferred in good condition and all vital signs stable  from the OR to recovery under the discretion of anesthesia.  Condition: Vital signs stable, neurovascular status unchanged from preoperative   Surgical plan:  Expect clean surgical margin.  Weightbearing as tolerated postop shoe.  5 days Augmentin on discharge.  Stable for discharge home tomorrow.  Will have office call to arrange follow-up in the  office next week Thursday/friday.  Leave dressings clean dry and intact until  then.  The patient will be WBAT in a post op shoe to the operative limb until further instructed. The dressing is to remain clean, dry, and intact. Will continue to follow unless noted elsewhere.   Marsa Honour, DPM [facility]

## 2024-01-08 NOTE — Anesthesia Preprocedure Evaluation (Addendum)
 Anesthesia Evaluation  Patient identified by MRN, date of birth, ID band Patient awake  General Assessment Comment:Very hard of hearing  Reviewed: Allergy & Precautions, NPO status , Patient's Chart, lab work & pertinent test results  Airway Mallampati: II  TM Distance: >3 FB Neck ROM: full    Dental  (+) Teeth Intact   Pulmonary neg pulmonary ROS, sleep apnea    Pulmonary exam normal breath sounds clear to auscultation       Cardiovascular Exercise Tolerance: Good + Past MI  Normal cardiovascular exam Rhythm:Regular Rate:Normal     Neuro/Psych    Depression   Dementia CVA, No Residual Symptoms negative neurological ROS  negative psych ROS   GI/Hepatic negative GI ROS, Neg liver ROS,GERD  Medicated,,  Endo/Other  negative endocrine ROS    Renal/GU negative Renal ROS  negative genitourinary   Musculoskeletal   Abdominal Normal abdominal exam  (+)   Peds negative pediatric ROS (+)  Hematology negative hematology ROS (+)   Anesthesia Other Findings Past Medical History: No date: Allergy No date: Alzheimer dementia (HCC) No date: Arthritis No date: Cataract No date: GERD (gastroesophageal reflux disease) No date: Glaucoma No date: Heart murmur No date: Myocardial infarction (HCC) No date: Seizures (HCC) No date: Sleep apnea No date: Stroke [facility])  Past Surgical History: No date: BREAST BIOPSY; Right     Comment:  CORE W/CLIP X 2 - NEG  BMI    Body Mass Index: 24.41 kg/m      Reproductive/Obstetrics negative OB ROS                              Anesthesia Physical Anesthesia Plan  ASA: 3  Anesthesia Plan: MAC   Post-op Pain Management:    Induction: Intravenous  PONV Risk Score and Plan:   Airway Management Planned: Natural Airway and Nasal Cannula  Additional Equipment:   Intra-op Plan:   Post-operative Plan:   Informed Consent: I have reviewed the patients  History and Physical, chart, labs and discussed the procedure including the risks, benefits and alternatives for the proposed anesthesia with the patient or authorized representative who has indicated his/her understanding and acceptance.       Plan Discussed with: CRNA  Anesthesia Plan Comments:          Anesthesia Quick Evaluation

## 2024-01-08 NOTE — Anesthesia Postprocedure Evaluation (Signed)
 Anesthesia Post Note  Patient: Stacey Warner  Procedure(s) Performed: AMPUTATION, TOE (Right: Toe)  Patient location during evaluation: PACU Anesthesia Type: MAC Level of consciousness: awake Pain management: satisfactory to patient Vital Signs Assessment: post-procedure vital signs reviewed and stable Respiratory status: spontaneous breathing Cardiovascular status: stable Anesthetic complications: no   No notable events documented.   Last Vitals:  Vitals:   01/08/24 1345 01/08/24 1400  BP: 122/73 134/69  Pulse: (!) 54 (!) 52  Resp: 10 12  Temp:    SpO2: 98% 97%    Last Pain:  Vitals:   01/08/24 1415  TempSrc:   PainSc: 0-No pain                 VAN STAVEREN,Mahlik Lenn

## 2024-01-09 ENCOUNTER — Encounter: Payer: Self-pay | Admitting: Podiatry

## 2024-01-09 ENCOUNTER — Other Ambulatory Visit: Payer: Self-pay

## 2024-01-09 DIAGNOSIS — M869 Osteomyelitis, unspecified: Secondary | ICD-10-CM | POA: Diagnosis not present

## 2024-01-09 DIAGNOSIS — F0280 Dementia in other diseases classified elsewhere without behavioral disturbance: Secondary | ICD-10-CM | POA: Diagnosis not present

## 2024-01-09 DIAGNOSIS — G309 Alzheimer's disease, unspecified: Secondary | ICD-10-CM | POA: Diagnosis not present

## 2024-01-09 LAB — COMPREHENSIVE METABOLIC PANEL WITH GFR
ALT: 12 U/L (ref 0–44)
AST: 17 U/L (ref 15–41)
Albumin: 3.4 g/dL — ABNORMAL LOW (ref 3.5–5.0)
Alkaline Phosphatase: 88 U/L (ref 38–126)
Anion gap: 6 (ref 5–15)
BUN: 20 mg/dL (ref 8–23)
CO2: 27 mmol/L (ref 22–32)
Calcium: 9 mg/dL (ref 8.9–10.3)
Chloride: 103 mmol/L (ref 98–111)
Creatinine, Ser: 0.84 mg/dL (ref 0.44–1.00)
GFR, Estimated: >60 mL/min (ref >60)
Glucose, Bld: 140 mg/dL — ABNORMAL HIGH (ref 70–99)
Potassium: 3.4 mmol/L — ABNORMAL LOW (ref 3.5–5.1)
Sodium: 136 mmol/L (ref 135–145)
Total Bilirubin: 0.5 mg/dL (ref 0.0–1.2)
Total Protein: 6.9 g/dL (ref 6.5–8.1)

## 2024-01-09 LAB — CBC WITH DIFFERENTIAL/PLATELET
Abs Immature Granulocytes: 0.04 K/uL (ref 0.00–0.07)
Basophils Absolute: 0 K/uL (ref 0.0–0.1)
Basophils Relative: 1 %
Eosinophils Absolute: 0.2 K/uL (ref 0.0–0.5)
Eosinophils Relative: 3 %
HCT: 33.4 % — ABNORMAL LOW (ref 36.0–46.0)
Hemoglobin: 10.5 g/dL — ABNORMAL LOW (ref 12.0–15.0)
Immature Granulocytes: 1 %
Lymphocytes Relative: 20 %
Lymphs Abs: 1.1 K/uL (ref 0.7–4.0)
MCH: 29.6 pg (ref 26.0–34.0)
MCHC: 31.4 g/dL (ref 30.0–36.0)
MCV: 94.1 fL (ref 80.0–100.0)
Monocytes Absolute: 0.4 K/uL (ref 0.1–1.0)
Monocytes Relative: 7 %
Neutro Abs: 3.9 K/uL (ref 1.7–7.7)
Neutrophils Relative %: 68 %
Platelets: 226 K/uL (ref 150–400)
RBC: 3.55 MIL/uL — ABNORMAL LOW (ref 3.87–5.11)
RDW: 13.8 % (ref 11.5–15.5)
WBC: 5.7 K/uL (ref 4.0–10.5)
nRBC: 0 % (ref 0.0–0.2)

## 2024-01-09 LAB — CREATININE, SERUM
Creatinine, Ser: 0.81 mg/dL (ref 0.44–1.00)
GFR, Estimated: >60 mL/min (ref >60)

## 2024-01-09 MED ORDER — METRONIDAZOLE 500 MG PO TABS
500.0000 mg | ORAL_TABLET | Freq: Two times a day (BID) | ORAL | 0 refills | Status: AC
  Filled 2024-01-09: qty 8, 4d supply, fill #0

## 2024-01-09 MED ORDER — CEFUROXIME AXETIL 500 MG PO TABS
500.0000 mg | ORAL_TABLET | Freq: Two times a day (BID) | ORAL | 0 refills | Status: AC
  Filled 2024-01-09: qty 8, 4d supply, fill #0

## 2024-01-09 NOTE — Discharge Summary (Signed)
 DISCHARGE SUMMARY    Stacey Warner FMW:[id number] DOB: [birth year] DOA: 01/07/2024  PCP: Auston Reyes BIRCH, MD  Admit date: 01/07/2024 Discharge date: 01/09/2024   Recommendations for Outpatient Follow-up:  1.  Follow-up with podiatry in the clinic as scheduled in 1 week   Hospital Course: Stacey Warner is a 89 year old with Alzheimer dementia, hard of hearing, chronic pain syndrome, depression, type 2 diabetes, hyperlipidemia, tardive dyskinesia, who presents to the ED on 10/15 with right toe pain.  In the ED labs and vitals are grossly unremarkable.  Patient was admitted due to concern of osteomyelitis which was later confirmed on MRI.  Podiatry was consulted and patient was started on broad-spectrum antibiotics.  On 10/16 patient underwent uncomplicated amputation.  She was cleared for weightbearing as tolerated in her postop shoe and did well with physical therapy and Occupational Therapy.  She will be discharging back to her memory care home with an additional 5 days of antibiotics.  Osteomyelitis right third toe - Status post amputation 10/16. - Clean surgical margins expected.  Ideally discharged with Augmentin but patient has extensive allergy profile including Augmentin, ciprofloxacin , clindamycin, minocycline, sulfa antibiotics.  Discussed with clinical Pharm.  DC on cefuroxime and Flagyl. - WB AT postop shoe - PT/OT ordered   Diabetes, well-controlled - Hemoglobin A1c 5.5% - Blood glucose thus far under control.  Does not appear she is on any home meds.   Hyperlipidemia - Continue statin   Alzheimer's dementia Tardive dyskinesia Depression - Continue home meds   Chronic pain syndrome - Receiving tramadol and lorazepam at home - PDMP reviewed    Discharge Instructions  Discharge Instructions     Call MD for:  difficulty breathing, headache or visual disturbances   Complete by: As directed    Call MD for:  persistant dizziness or light-headedness    Complete by: As directed    Call MD for:  persistant nausea and vomiting   Complete by: As directed    Call MD for:  severe uncontrolled pain   Complete by: As directed    Call MD for:  temperature >100.4   Complete by: As directed    Diet general   Complete by: As directed    Discharge instructions   Complete by: As directed    Follow up with your primary care physician to discuss the medication changes during this admission. Follow up with podiatry in the clinic in one week as scheduled.   Increase activity slowly   Complete by: As directed    Leave dressing on - Keep it clean, dry, and intact until clinic visit   Complete by: As directed       Allergies as of 01/09/2024       Reactions   Amoxicillin-pot Clavulanate Rash   Ciprofloxacin     Other reaction(s): Unknown Pt doesn't remember   Clindamycin    Other reaction(s): Unknown   Minocycline    Other reaction(s): Unknown Pt states made her feel funny   Sucralfate    Other reaction(s): Other (See Comments) can't take   Sulfa Antibiotics Rash   Had a small area of rash about 30 years ago when she took sulfa, has not taken since.   Tape Rash        Medication List     STOP taking these medications    lamoTRIgine  100 MG tablet Commonly known as: LAMICTAL    memantine 5 MG tablet Commonly known as: NAMENDA   omeprazole 40 MG capsule Commonly known as:  PRILOSEC   potassium chloride  SA 20 MEQ tablet Commonly known as: KLOR-CON  M       TAKE these medications    acetaminophen  325 MG tablet Commonly known as: TYLENOL  Take 650 mg by mouth every 6 (six) hours as needed.   azelastine 0.1 % nasal spray Commonly known as: ASTELIN   cefUROXime 500 MG tablet Commonly known as: CEFTIN Take 1 tablet (500 mg total) by mouth 2 (two) times daily with a meal for 4 days.   diclofenac Sodium 1 % Gel Commonly known as: VOLTAREN Apply 1 Application topically 4 (four) times daily.   dorzolamide-timolol 2-0.5 %  ophthalmic solution Commonly known as: COSOPT 1 drop daily in the afternoon.   fludrocortisone 0.1 MG tablet Commonly known as: FLORINEF Take 0.1 mg by mouth daily.   fluticasone 50 MCG/ACT nasal spray Commonly known as: FLONASE 1 spray 2 (two) times daily at 10 AM and 5 PM.   furosemide 40 MG tablet Commonly known as: LASIX Take 40 mg by mouth daily.   latanoprost 0.005 % ophthalmic solution Commonly known as: XALATAN at bedtime.   LORazepam 0.5 MG tablet Commonly known as: ATIVAN Take 0.5 mg by mouth every 6 (six) hours as needed for anxiety.   metroNIDAZOLE 500 MG tablet Commonly known as: FLAGYL Take 1 tablet (500 mg total) by mouth every 12 (twelve) hours for 4 days.   mirtazapine 15 MG tablet Commonly known as: REMERON Take 15 mg by mouth at bedtime.   montelukast 10 MG tablet Commonly known as: SINGULAIR TAKE ONE TABLET DAILY EACH EVENING PRIOR TO BEDTIME   PEG 3350 17 GM/SCOOP Powd Take 17 g by mouth daily.   risperiDONE 0.25 MG tablet Commonly known as: RISPERDAL Take 0.25 mg by mouth 2 (two) times daily.   sertraline 50 MG tablet Commonly known as: ZOLOFT Take 50 mg by mouth daily.   solifenacin  5 MG tablet Commonly known as: VESICARE  Take 1 tablet (5 mg total) by mouth daily.   traMADol 50 MG tablet Commonly known as: ULTRAM Take 50 mg by mouth daily as needed.               Discharge Care Instructions  (From admission, onward)           Start     Ordered   01/09/24 0000  Leave dressing on - Keep it clean, dry, and intact until clinic visit        01/09/24 1145            Follow-up Information     Auston Reyes BIRCH, MD. Go on 01/09/2024.   Specialty: Internal Medicine Why: hospital follow up at Contact information: [address] [facility] [place] [address] [phone]                Allergies  Allergen Reactions   Amoxicillin-Pot Clavulanate Rash   Ciprofloxacin      Other  reaction(s): Unknown Pt doesn't remember   Clindamycin     Other reaction(s): Unknown   Minocycline     Other reaction(s): Unknown Pt states made her feel funny   Sucralfate     Other reaction(s): Other (See Comments) can't take   Sulfa Antibiotics Rash    Had a small area of rash about 30 years ago when she took sulfa, has not taken since.   Tape Rash    Consultations: Treatment Team:  Malvin Marsa FALCON, DPM   Procedures/Studies: DG Foot 2 Views Right Result Date: 01/08/2024 CLINICAL  DATA:  Status post third toe amputation EXAM: DG FOOT 2V*R* COMPARISON:  01/07/2024 FINDINGS: There is been interval amputation of the third toe. Only a minimal portion of the third proximal phalanx remains. No other focal abnormality is noted. IMPRESSION: Interval third toe amputation as described. Electronically Signed   By: Oneil Devonshire M.D.   On: 01/08/2024 15:36   MRI Right foot without contrast Result Date: 01/07/2024 CLINICAL DATA:  Pain and swelling of the right third toe. EXAM: MRI OF THE RIGHT FOREFOOT WITHOUT CONTRAST TECHNIQUE: Multiplanar, multisequence MR imaging of the right forefoot was performed. No intravenous contrast was administered. COMPARISON:  Right foot radiographs dated 01/07/2024. FINDINGS: Bones/Joint/Cartilage Marrow edema of the tuft of the distal phalanx of the third toe is suspicious for osteomyelitis. No marrow signal abnormality identified elsewhere to suggest acute osteomyelitis. No acute fracture or dislocation. No joint effusion. Mild osteoarthritis of the first MTP joint. Ligaments Collateral ligaments are intact.  Lisfranc ligament is intact. Muscles and Tendons No appreciable acute abnormality. Soft tissue Cutaneous irregularity with suspected wound at the tip of the third toe. No fluid collection. IMPRESSION: 1. Cutaneous irregularity with suspected wound at the tip of the third toe. Marrow edema of the underlying tuft of the distal phalanx of the third toe  is suspicious for acute osteomyelitis. 2. Mild osteoarthritis of the first MTP joint. Electronically Signed   By: Harrietta Sherry M.D.   On: 01/07/2024 20:12   DG Foot Complete Right Result Date: 01/07/2024 CLINICAL DATA:  Pain and swelling of the right third toe. EXAM: RIGHT FOOT COMPLETE - 3+ VIEW COMPARISON:  None Available. FINDINGS: Age related osteoporosis. Mild degenerative changes for age. No fracture or destructive bony changes to suggest osteomyelitis. IMPRESSION: 1. Age related osteoporosis and mild degenerative changes. 2. No acute bony findings. Electronically Signed   By: MYRTIS Stammer M.D.   On: 01/07/2024 14:35      Discharge Exam: Vitals:   01/09/24 0549 01/09/24 0819  BP: (!) 141/63 136/84  Pulse: (!) 59 63  Resp: 16 16  Temp: 98.1 F (36.7 C) 97.7 F (36.5 C)  SpO2: 97% 97%   Vitals:   01/08/24 1532 01/08/24 2002 01/09/24 0549 01/09/24 0819  BP: 126/77 133/84 (!) 141/63 136/84  Pulse: (!) 56 (!) 55 (!) 59 63  Resp: 17 18 16 16   Temp: 97.7 F (36.5 C) 97.6 F (36.4 C) 98.1 F (36.7 C) 97.7 F (36.5 C)  TempSrc:      SpO2: 97% 95% 97% 97%  Weight:      Height:        Constitutional:  Normal appearance. Non toxic-appearing.  HENT: Head Normocephalic and atraumatic.  Mucous membranes are moist.  Eyes:  Extraocular intact. Conjunctivae normal.  Cardiovascular: Rate and Rhythm: Normal rate and regular rhythm.  Pulmonary: Non labored, symmetric rise of chest wall.  Skin: warm and dry. not jaundiced.  Neurological: No focal deficit present. alert.  Pleasantly confused.  Aware she is in the hospital, does not know year or president   The results of significant diagnostics from this hospitalization (including imaging, microbiology, ancillary and laboratory) are listed below for reference.     Microbiology: Recent Results (from the past 240 hours)  Blood Cultures x 2 sites     Status: None (Preliminary result)   Collection Time: 01/07/24  4:56 PM    Specimen: BLOOD  Result Value Ref Range Status   Specimen Description BLOOD RIGHT ANTECUBITAL  Final   Special Requests  Final    BOTTLES DRAWN AEROBIC AND ANAEROBIC Blood Culture results may not be optimal due to an inadequate volume of blood received in culture bottles   Culture   Final    NO GROWTH 2 DAYS Performed at [facility], [address] [place], [place] [address]    Report Status PENDING  Incomplete  Blood Cultures x 2 sites     Status: None (Preliminary result)   Collection Time: 01/07/24  4:56 PM   Specimen: BLOOD  Result Value Ref Range Status   Specimen Description BLOOD LEFT ANTECUBITAL  Final   Special Requests   Final    BOTTLES DRAWN AEROBIC AND ANAEROBIC Blood Culture results may not be optimal due to an inadequate volume of blood received in culture bottles   Culture   Final    NO GROWTH 2 DAYS Performed at [facility], [address], [place], [place] [address]    Report Status PENDING  Incomplete  Aerobic/Anaerobic Culture w Gram Stain (surgical/deep wound)     Status: None (Preliminary result)   Collection Time: 01/08/24  1:06 PM   Specimen: PATH Digit amputation; Tissue  Result Value Ref Range Status   Specimen Description   Final    TOE Performed at [facility], [address] [place], [place] [address]    Special Requests   Final    RIGHT THIRD TOE CULTURE Performed at [facility], [address] [place], [place] [address]    Gram Stain NO WBC SEEN NO ORGANISMS SEEN   Final   Culture   Final    MODERATE STAPHYLOCOCCUS AUREUS CULTURE REINCUBATED FOR BETTER GROWTH Performed at [facility], [address] [place], [place] [address]    Report Status PENDING  Incomplete     Labs: BNP (last 3 results) No results for input(s): BNP in the last 8760 hours. Basic Metabolic Panel: Recent Labs  Lab 01/07/24 1351 01/08/24 0418 01/09/24 0517 01/09/24 1036  NA 138 141  --  136  K 3.9 3.6  --   3.4*  CL 102 105  --  103  CO2 26 25  --  27  GLUCOSE 117* 88  --  140*  BUN 25* 25*  --  20  CREATININE 1.14* 0.93 0.81 0.84  CALCIUM 9.0 8.9  --  9.0  MG  --  2.2  --   --    Liver Function Tests: Recent Labs  Lab 01/07/24 1351 01/09/24 1036  AST 19 17  ALT 13 12  ALKPHOS 98 88  BILITOT 0.5 0.5  PROT 6.8 6.9  ALBUMIN 3.7 3.4*   No results for input(s): LIPASE, AMYLASE in the last 168 hours. No results for input(s): AMMONIA in the last 168 hours. CBC: Recent Labs  Lab 01/07/24 1351 01/08/24 0418 01/09/24 1036  WBC 6.3 5.5 5.7  NEUTROABS 3.5  --  3.9  HGB 10.3* 9.3* 10.5*  HCT 31.2* 29.3* 33.4*  MCV 93.7 94.8 94.1  PLT 209 192 226   Cardiac Enzymes: No results for input(s): CKTOTAL, CKMB, CKMBINDEX, TROPONINI in the last 168 hours. BNP: Invalid input(s): POCBNP CBG: No results for input(s): GLUCAP in the last 168 hours. D-Dimer No results for input(s): DDIMER in the last 72 hours. Hgb A1c Recent Labs    01/08/24 0418  HGBA1C 5.5   Lipid Profile No results for input(s): CHOL, HDL, LDLCALC, TRIG, CHOLHDL, LDLDIRECT in the last 72 hours. Thyroid  function studies No results for input(s): TSH,  T4TOTAL, T3FREE, THYROIDAB in the last 72 hours.  Invalid input(s): FREET3 Anemia work up No results for input(s): VITAMINB12, FOLATE, FERRITIN, TIBC, IRON, RETICCTPCT in the last 72 hours. Urinalysis    Component Value Date/Time   COLORURINE STRAW (A) 10/18/2021 1518   APPEARANCEUR Cloudy (A) 10/14/2022 1548   LABSPEC 1.008 10/18/2021 1518   PHURINE 6.0 10/18/2021 1518   GLUCOSEU Negative 10/14/2022 1548   HGBUR NEGATIVE 10/18/2021 1518   BILIRUBINUR Negative 10/14/2022 1548   KETONESUR NEGATIVE 10/18/2021 1518   PROTEINUR 2+ (A) 10/14/2022 1548   PROTEINUR NEGATIVE 10/18/2021 1518   NITRITE Negative 10/14/2022 1548   NITRITE NEGATIVE 10/18/2021 1518   LEUKOCYTESUR 1+ (A) 10/14/2022 1548   LEUKOCYTESUR  MODERATE (A) 10/18/2021 1518   Sepsis Labs Recent Labs  Lab 01/07/24 1351 01/08/24 0418 01/09/24 1036  WBC 6.3 5.5 5.7   Microbiology Recent Results (from the past 240 hours)  Blood Cultures x 2 sites     Status: None (Preliminary result)   Collection Time: 01/07/24  4:56 PM   Specimen: BLOOD  Result Value Ref Range Status   Specimen Description BLOOD RIGHT ANTECUBITAL  Final   Special Requests   Final    BOTTLES DRAWN AEROBIC AND ANAEROBIC Blood Culture results may not be optimal due to an inadequate volume of blood received in culture bottles   Culture   Final    NO GROWTH 2 DAYS Performed at [facility], [address], [place], [place] [address]    Report Status PENDING  Incomplete  Blood Cultures x 2 sites     Status: None (Preliminary result)   Collection Time: 01/07/24  4:56 PM   Specimen: BLOOD  Result Value Ref Range Status   Specimen Description BLOOD LEFT ANTECUBITAL  Final   Special Requests   Final    BOTTLES DRAWN AEROBIC AND ANAEROBIC Blood Culture results may not be optimal due to an inadequate volume of blood received in culture bottles   Culture   Final    NO GROWTH 2 DAYS Performed at [facility], [address] [place], [place] [address]    Report Status PENDING  Incomplete  Aerobic/Anaerobic Culture w Gram Stain (surgical/deep wound)     Status: None (Preliminary result)   Collection Time: 01/08/24  1:06 PM   Specimen: PATH Digit amputation; Tissue  Result Value Ref Range Status   Specimen Description   Final    TOE Performed at [facility], [address], [place], [place] [address]    Special Requests   Final    RIGHT THIRD TOE CULTURE Performed at [facility], [address] [place], [place] [address]    Gram Stain NO WBC SEEN NO ORGANISMS SEEN   Final   Culture   Final    MODERATE STAPHYLOCOCCUS AUREUS CULTURE REINCUBATED FOR BETTER GROWTH Performed at [facility], [address]  [place], [place] [address]    Report Status PENDING  Incomplete     Time coordinating discharge: 32 min   SIGNED: Taysom Glymph, DO [facility] 01/09/2024, 4:34 PM Pager   If 7PM-7AM, please contact night-coverage

## 2024-01-09 NOTE — NC FL2 (Signed)
 [place]  MEDICAID [id number] LEVEL OF CARE FORM     IDENTIFICATION  Patient Name: Stacey Warner Birthdate: 08-May-1932 Sex: female Admission Date (Current Location): 01/07/2024  [facility] and [place] Number:  Chiropodist and Address:  [facility], [address], [place], [place] [address]      Provider Number: [id number]  Attending Physician Name and Address:  Leesa Kast, DO  Relative Name and Phone Number:  JONETTA Orene Pay (Sister)  [phone]    Current Level of Care: Hospital Recommended Level of Care: Memory Care Prior Approval Number:    Date Approved/Denied:   PASRR Number:    Discharge Plan: Domiciliary (Rest home)    Current Diagnoses: Patient Active Problem List   Diagnosis Date Noted   Osteomyelitis of third toe of right foot (HCC) 01/08/2024   Cellulitis of right foot 01/07/2024   Dementia (HCC) 01/07/2024   Depression 01/07/2024   Lumbar spondylosis 10/11/2021   Lumbar degenerative disc disease 10/11/2021   Chronic bilateral low back pain without sciatica 10/11/2021   Chronic pain syndrome 10/11/2021    Orientation RESPIRATION BLADDER Height & Weight     Self    Incontinent Weight: 56.7 kg Height:  5' (152.4 cm)  BEHAVIORAL SYMPTOMS/MOOD NEUROLOGICAL BOWEL NUTRITION STATUS      Incontinent Diet (Heart)  AMBULATORY STATUS COMMUNICATION OF NEEDS Skin   Limited Assist Verbally Surgical wounds (R third toe amputation: Xerofoam, kerlix, 4 X 4, ace bandage)                       Personal Care Assistance Level of Assistance  Bathing, Feeding, Dressing Bathing Assistance: Limited assistance Feeding assistance: Independent Dressing Assistance: Limited assistance     Functional Limitations Info             SPECIAL CARE FACTORS FREQUENCY                       Contractures      Additional Factors Info  Code Status, Allergies Code Status Info: FULL Allergies Info: Amoxicillin-pot Clavulanate,  Ciprofloxacin , Clindamycin, Minocycline, Sucralfate, Sulfa Antibiotics, Tape           Current Medications (01/09/2024):  This is the current hospital active medication list Current Facility-Administered Medications  Medication Dose Route Frequency Provider Last Rate Last Admin   acetaminophen  (TYLENOL ) tablet 650 mg  650 mg Oral Q6H PRN Cox, Amy N, DO   650 mg at 01/08/24 2011   Or   acetaminophen  (TYLENOL ) suppository 650 mg  650 mg Rectal Q6H PRN Cox, Amy N, DO       cefUROXime (CEFTIN) tablet 500 mg  500 mg Oral BID WC Dezii, Alexandra, DO   500 mg at 01/09/24 0845   fludrocortisone (FLORINEF) tablet 0.1 mg  0.1 mg Oral Daily Cox, Amy N, DO   0.1 mg at 01/09/24 0846   furosemide (LASIX) tablet 40 mg  40 mg Oral Daily Cox, Amy N, DO   40 mg at 01/09/24 0846   LORazepam (ATIVAN) tablet 0.5 mg  0.5 mg Oral Q6H PRN Cox, Amy N, DO       metroNIDAZOLE (FLAGYL) tablet 500 mg  500 mg Oral Q12H Dezii, Alexandra, DO   500 mg at 01/09/24 0845   mirtazapine (REMERON) tablet 15 mg  15 mg Oral QHS Cox, Amy N, DO   15 mg at 01/08/24 2250   montelukast (SINGULAIR) tablet 10 mg  10 mg Oral QHS Cox, Amy  N, DO   10 mg at 01/08/24 2251   ondansetron (ZOFRAN) tablet 4 mg  4 mg Oral Q6H PRN Cox, Amy N, DO       Or   ondansetron (ZOFRAN) injection 4 mg  4 mg Intravenous Q6H PRN Cox, Amy N, DO       polyethylene glycol (MIRALAX / GLYCOLAX) packet 17 g  17 g Oral Daily Cox, Amy N, DO   17 g at 01/09/24 0846   risperiDONE (RISPERDAL) tablet 0.25 mg  0.25 mg Oral BID Cox, Amy N, DO   0.25 mg at 01/09/24 0845   senna-docusate (Senokot-S) tablet 1 tablet  1 tablet Oral QHS PRN Cox, Amy N, DO       sertraline (ZOLOFT) tablet 50 mg  50 mg Oral Daily Cox, Amy N, DO   50 mg at 01/09/24 0845   traMADol (ULTRAM) tablet 50 mg  50 mg Oral Q6H PRN Cox, Amy N, DO         Discharge Medications: Please see discharge summary for a list of discharge medications.  Relevant Imaging Results:  Relevant Lab  Results:   Additional Information [id number]  Dalia GORMAN Fuse, RN

## 2024-01-09 NOTE — Progress Notes (Signed)
 Report called to the [facility]. All questions addressed at time of call.

## 2024-01-09 NOTE — Evaluation (Signed)
 Physical Therapy Evaluation Patient Details Name: Stacey Warner MRN: [id number] DOB: 1932/08/20 Today's Date: 01/09/2024  History of Present Illness  Stacey Warner is an 89 y.o. female with PMH of Alzheimer dementia, chronic pain syndrome, depression, type 2 diabetes, hyperlipidemia, tardive dyskinesia. Pt  admitted on 01/07/2024 with osteomyelitis of the distal right third toe. Now s/p amputation on 10/16, WBAT, with RLE in post op shoe.   Clinical Impression  Pt alert, oriented to self only, but pleasant and agreeable to PT evaluation. Pt is a poor historian with no family present to confirm PLOF, pt stated she is ambulatory with no AD typically. Pt was met sitting in recliner, performed STS transfers with CGA and max VC for proper hand placement on RW. Pt amb ~10ft in room with RW, no LOB, demonstrated hop-to pattern with pt ambulating mostly on R forefoot throughout with R foot maintained forward outside BOS. Max multimodal cues throughout amb for foot-flat in stance and for even step length, pt unable to follow commands despite extensive cues. Podiatry notified of pt's gait pattern via secure chat with pt possibly benefiting from wedge post-op shoe to limit WB on forefoot. Pt was left seated in recliner at end of session, all needs in reach. Pt would benefit from skilled PT intervention to address listed deficits (see PT Problem List) and improve overall functional mobility status.        If plan is discharge home, recommend the following: A little help with walking and/or transfers;A little help with bathing/dressing/bathroom;Assistance with cooking/housework;Direct supervision/assist for medications management;Direct supervision/assist for financial management;Assist for transportation   Can travel by private vehicle        Equipment Recommendations Rolling walker (2 wheels)  Recommendations for Other Services       Functional Status Assessment Patient has had a recent decline in  their functional status and demonstrates the ability to make significant improvements in function in a reasonable and predictable amount of time.     Precautions / Restrictions Precautions Precautions: Fall Recall of Precautions/Restrictions: Impaired Precaution/Restrictions Comments: WBAT RLE with post-op shoe, may use wedge or flat post-op shoe based on therapist discretion Restrictions Weight Bearing Restrictions Per Provider Order: Yes RLE Weight Bearing Per Provider Order: Weight bearing as tolerated Other Position/Activity Restrictions: WBAT with post-op shoe      Mobility  Bed Mobility               General bed mobility comments: NT, pt in recliner pre/post session    Transfers Overall transfer level: Needs assistance Equipment used: Rolling walker (2 wheels) Transfers: Sit to/from Stand Sit to Stand: Contact guard assist           General transfer comment: STS from recliner with CGA, max VC for proper hand placement on RW    Ambulation/Gait Ambulation/Gait assistance: Contact guard assist Gait Distance (Feet): 10 Feet Assistive device: Rolling walker (2 wheels) Gait Pattern/deviations: Step-to pattern, Decreased stride length, Narrow base of support Gait velocity: decreased     General Gait Details: Pt initially stood with RLE forward in front of her, attempted to cue pt to bring R foot back and pt unable to. Pt noted to ambulate on R forefoot throughout, max multimodal cues for foot flat in stance but unable to follow commands. Mostly a hop-to pattern throughout despite education on WBAT RLE status.  Stairs            Wheelchair Mobility     Tilt Bed    Modified Rankin (Stroke  Patients Only)       Balance Overall balance assessment: Needs assistance Sitting-balance support: Feet supported Sitting balance-Leahy Scale: Good Sitting balance - Comments: steady static and dynamic sitting   Standing balance support: Bilateral upper extremity  supported, Reliant on assistive device for balance Standing balance-Leahy Scale: Fair Standing balance comment: heavy BUE support on RW in standing                             Pertinent Vitals/Pain Pain Assessment Pain Assessment: 0-10 Pain Score: 2  Pain Location: R foot Pain Descriptors / Indicators: Aching, Sore Pain Intervention(s): Monitored during session, Repositioned    Home Living Family/patient expects to be discharged to:: Other (Comment) [facility] per chart)                        Prior Function Prior Level of Function : Patient poor historian/Family not available             Mobility Comments: Pt reports that she was previously ambulatory with no AD, cites that she has had falls but none recently, no family present to verify ADLs Comments: Reports being IND with ADLs, no family present to verify     Extremity/Trunk Assessment   Upper Extremity Assessment Upper Extremity Assessment: Generalized weakness    Lower Extremity Assessment Lower Extremity Assessment: Generalized weakness       Communication   Communication Communication: Impaired Factors Affecting Communication: Hearing impaired    Cognition Arousal: Alert Behavior During Therapy: WFL for tasks assessed/performed   PT - Cognitive impairments: History of cognitive impairments, Orientation, Memory, Sequencing, Problem solving, Safety/Judgement   Orientation impairments: Place, Time, Situation                   PT - Cognition Comments: oriented to self only, unaware of situation did not remember having her R 3rd toe amputated yesterday Following commands: Impaired Following commands impaired: Follows one step commands inconsistently, Follows one step commands with increased time     Cueing Cueing Techniques: Verbal cues, Gestural cues, Tactile cues, Visual cues     General Comments      Exercises     Assessment/Plan    PT Assessment  Patient needs continued PT services  PT Problem List Decreased strength;Decreased activity tolerance;Decreased balance;Decreased mobility;Decreased coordination;Decreased cognition;Decreased knowledge of use of DME;Decreased safety awareness;Decreased skin integrity;Pain       PT Treatment Interventions DME instruction;Gait training;Functional mobility training;Therapeutic activities;Therapeutic exercise;Balance training;Neuromuscular re-education;Cognitive remediation;Patient/family education    PT Goals (Current goals can be found in the Care Plan section)  Acute Rehab PT Goals Patient Stated Goal: to go home PT Goal Formulation: With patient Time For Goal Achievement: 01/23/24 Potential to Achieve Goals: Fair    Frequency Min 3X/week     Co-evaluation               AM-PAC PT 6 Clicks Mobility  Outcome Measure Help needed turning from your back to your side while in a flat bed without using bedrails?: A Little Help needed moving from lying on your back to sitting on the side of a flat bed without using bedrails?: A Little Help needed moving to and from a bed to a chair (including a wheelchair)?: A Little Help needed standing up from a chair using your arms (e.g., wheelchair or bedside chair)?: A Little Help needed to walk in hospital room?: A Little Help needed  climbing 3-5 steps with a railing? : A Lot 6 Click Score: 17    End of Session Equipment Utilized During Treatment: Gait belt Activity Tolerance: Patient tolerated treatment well Patient left: in chair;with call bell/phone within reach;with chair alarm set Nurse Communication: Mobility status PT Visit Diagnosis: Unsteadiness on feet (R26.81);Muscle weakness (generalized) (M62.81);Difficulty in walking, not elsewhere classified (R26.2);Pain Pain - Right/Left: Right Pain - part of body: Ankle and joints of foot    Time: 0910-0923 PT Time Calculation (min) (ACUTE ONLY): 13 min   Charges:   PT Evaluation $PT  Eval Moderate Complexity: 1 Mod   PT General Charges $$ ACUTE PT VISIT: 1 Visit         Janell Axe, SPT

## 2024-01-09 NOTE — Evaluation (Signed)
 Occupational Therapy Evaluation Patient Details Name: Stacey Warner MRN: [id number] DOB: 1933-02-07 Today's Date: 01/09/2024   History of Present Illness   Stacey Warner is an 89 y.o. female with PMH of Alzheimer dementia, chronic pain syndrome, depression, type 2 diabetes, hyperlipidemia, tardive dyskinesia. Pt  admitted on 01/07/2024 with osteomyelitis of the distal right third toe. Now s/p amputation on 10/16, WBAT, with RLE in post op shoe.     Clinical Impressions Stacey Warner was seen for OT evaluation this date. Pt remains pleasantly confused t/o her OT session. She does not recall having surgery and perseverates on leaving the hospital. She is easy to re-direct and does follow VCs with increased time/cueing. Per chart, pt is from [facility] memory care. Pt presents with deficits in strength, safety awareness, activity tolerance, and functional use of her RLE, affecting safe and optimal ADL completion. Pt currently requires MIN A for bed mobility, close CGA for safety with functional mobility. Anticipate MIN A for LB ADL management from STS.  Pt would benefit from skilled OT services to address noted impairments and functional limitations (see below for any additional details) in order to maximize safety and independence while minimizing future risk of falls, injury, and readmission. Anticipate the need for follow up OT services in pt's home setting upon acute hospital DC.      If plan is discharge home, recommend the following:   A little help with walking and/or transfers;A little help with bathing/dressing/bathroom;Assist for transportation;Assistance with cooking/housework;Supervision due to cognitive status;Direct supervision/assist for medications management     Functional Status Assessment   Patient has had a recent decline in their functional status and demonstrates the ability to make significant improvements in function in a reasonable and predictable amount of  time.     Equipment Recommendations   BSC/3in1     Recommendations for Other Services         Precautions/Restrictions   Precautions Precautions: Fall Recall of Precautions/Restrictions: Impaired Precaution/Restrictions Comments: WBAT RLE with post-op shoe Restrictions Weight Bearing Restrictions Per Provider Order: Yes RLE Weight Bearing Per Provider Order: Weight bearing as tolerated Other Position/Activity Restrictions: WBAT with post-op shoe     Mobility Bed Mobility Overal bed mobility: Needs Assistance Bed Mobility: Supine to Sit     Supine to sit: Min assist, HOB elevated, Used rails          Transfers Overall transfer level: Needs assistance Equipment used: Rolling walker (2 wheels) Transfers: Sit to/from Stand Sit to Stand: Min assist, Contact guard assist           General transfer comment: Min A for initital STS, but progresses to CGA for safety with mobility during session.      Balance Overall balance assessment: Needs assistance Sitting-balance support: Feet supported Sitting balance-Leahy Scale: Good Sitting balance - Comments: steady static and dynamic sitting   Standing balance support: Bilateral upper extremity supported, Reliant on assistive device for balance, During functional activity Standing balance-Leahy Scale: Fair Standing balance comment: heavy BUE support on RW in standing                           ADL either performed or assessed with clinical judgement   ADL Overall ADL's : Needs assistance/impaired  General ADL Comments: MIN A for bed mobility and initial STS t/f, able to take a few hopping stepps toward recliner with use of RW. Tends to maintain NWB through her RLE, has difficulty however, sequencing mobility like this. Educated on WBAT status, but limited recally/carryover noted. PT to try wedge shoe to see if this improves mobility vs. regular  post-op shoe.     Vision Patient Visual Report: No change from baseline       Perception         Praxis         Pertinent Vitals/Pain Pain Assessment Pain Assessment: No/denies pain     Extremity/Trunk Assessment Upper Extremity Assessment Upper Extremity Assessment: Generalized weakness   Lower Extremity Assessment Lower Extremity Assessment: Generalized weakness       Communication Communication Communication: Impaired Factors Affecting Communication: Hearing impaired   Cognition Arousal: Alert Behavior During Therapy: WFL for tasks assessed/performed Cognition: Cognition impaired   Orientation impairments: Time, Situation Awareness: Online awareness impaired, Intellectual awareness impaired Memory impairment (select all impairments): Short-term memory, Working [employer] fast streamer, Conservation officer, historic buildings Attention impairment (select first level of impairment): Selective attention Executive functioning impairment (select all impairments): Organization, Sequencing, Reasoning, Problem solving OT - Cognition Comments: Pleasantly confused t/o session. Does not remember her surgery, does not understand why she is in the hospital. Easily re-directable.                 Following commands: Impaired Following commands impaired: Follows one step commands inconsistently, Follows one step commands with increased time     Cueing  General Comments   Cueing Techniques: Verbal cues;Gestural cues;Tactile cues;Visual cues      Exercises Other Exercises Other Exercises: Pt educated on role of OT in acute setting, safety, precautions, falls prevention for home and hospital.   Shoulder Instructions      Home Living Family/patient expects to be discharged to:: Other (Comment) [facility] per chart)                                        Prior Functioning/Environment Prior Level of Function : Patient poor historian/Family not available              Mobility Comments: Pt reports that she was previously ambulatory with no AD, cites that she has had falls but none recently, no family present to verify ADLs Comments: Reports being IND with ADLs, no family present to verify    OT Problem List: Decreased strength;Decreased coordination;Decreased activity tolerance;Decreased safety awareness;Impaired balance (sitting and/or standing);Decreased cognition;Decreased knowledge of use of DME or AE;Decreased knowledge of precautions   OT Treatment/Interventions: Self-care/ADL training;Therapeutic exercise;Therapeutic activities;DME and/or AE instruction;Patient/family education;Balance training;Energy conservation      OT Goals(Current goals can be found in the care plan section)   Acute Rehab OT Goals Patient Stated Goal: To go home OT Goal Formulation: With patient Time For Goal Achievement: 01/23/24 Potential to Achieve Goals: Good ADL Goals Pt Will Perform Grooming: sitting;with set-up;with supervision Pt Will Perform Lower Body Dressing: sit to/from stand;with supervision;with set-up;with caregiver independent in assisting Pt Will Transfer to Toilet: bedside commode;ambulating;with set-up;with supervision Pt Will Perform Toileting - Clothing Manipulation and hygiene: sit to/from stand;with caregiver independent in assisting;with supervision;with set-up;sitting/lateral leans   OT Frequency:  Min 2X/week    Co-evaluation              AM-PAC OT 6 Clicks  Daily Activity     Outcome Measure Help from another person eating meals?: A Little Help from another person taking care of personal grooming?: A Little Help from another person toileting, which includes using toliet, bedpan, or urinal?: A Little Help from another person bathing (including washing, rinsing, drying)?: A Little Help from another person to put on and taking off regular upper body clothing?: A Little Help from another person to put on and taking off regular  lower body clothing?: A Little 6 Click Score: 18   End of Session Equipment Utilized During Treatment: Gait belt;Rolling walker (2 wheels) Nurse Communication: Mobility status  Activity Tolerance: Patient tolerated treatment well Patient left: in chair;with call bell/phone within reach;with chair alarm set  OT Visit Diagnosis: Other abnormalities of gait and mobility (R26.89);Muscle weakness (generalized) (M62.81)                Time: 9164-9142 OT Time Calculation (min): 22 min Charges:  OT General Charges $OT Visit: 1 Visit OT Evaluation $OT Eval Moderate Complexity: 1 Mod OT Treatments $Self Care/Home Management : 8-22 mins  Jhonny Pelton, M.S., OTR/L 01/09/24, 11:36 AM

## 2024-01-09 NOTE — TOC Transition Note (Addendum)
 Transition of Care [facility]) - Discharge Note   Patient Details  Name: Stacey Warner MRN: [id number] Date of Birth: [birth year]  [facility] of [facility] CM/SW Contact:  Dalia GORMAN Fuse, RN Phone Number: 01/09/2024, 12:06 PM   Clinical Narrative:    The patient is medically clear to discharge to [facility]. TOC spoke with nurse at [facility]. TOC faxed DC summary and FL2 to [phone].  Nurse to call report to [phone] and ask for the cottage.     Final next level of care: Memory Care Barriers to Discharge: Barriers Resolved   Patient Goals and CMS Choice            Discharge Placement              Patient chooses bed at:  [facility]) Patient to be transferred to facility by: [facility] Name of family member notified: JONETTA Orene Pay (Sister)  [phone] Patient and family notified of of transfer: 01/09/24  Discharge Plan and Services Additional resources added to the After Visit Summary for     Discharge Planning Services: CM Consult                                 Social Drivers of Health (SDOH) Interventions SDOH Screenings   Food Insecurity: No Food Insecurity (01/07/2024)  Housing: Patient Declined (01/07/2024)  Transportation Needs: Patient Declined (01/07/2024)  Utilities: Not At Risk (01/07/2024)  Social Connections: Unknown (01/07/2024)  Tobacco Use: Low Risk  (01/07/2024)     Readmission Risk Interventions     No data to display

## 2024-01-09 NOTE — Progress Notes (Signed)
  Subjective:  Patient ID: Stacey Warner, female    DOB: May 12, 1932,  MRN: [id number]  Chief Complaint  Patient presents with   Wound Check    DOS: 01/08/24 Procedure: 1.  Amputation right third toe mid proximal phalanx level     89 y.o. female seen for post op check. She is having a difficult time hearing me - doesn't know where her hearing aids are.   Review of Systems: Negative except as noted in the HPI. Denies N/V/F/Ch.   Objective:   Constitutional Well developed. Well nourished.  Vascular Foot warm and well perfused. Capillary refill normal to all digits.   No calf pain with palpation  Neurologic Normal speech. Oriented to person, place, and time. Epicritic sensation diminished to right foot  Dermatologic Dressing C/D/I to right foot  Orthopedic: S/p R third toe amputation proximal phalanx base.    Radiographs: Interval third toe amputation as described.   Pathology: pending  Micro: pending  Assessment:   1. Toe infection   Osteomyelitis right third toe s/p amputation  Plan:  Patient was evaluated and treated and all questions answered.  POD # 1 s/p Right third toe amputation through proximal phalanx base -Progressing as expected postop -XR: expected post op changes -WB Status: WBAT in post op shoe or wedge shoe per PT recs -Sutures: remain intact. -Medications/ABX: Recommend 5 days po abx on dc, has multiple allergies. Could do doxycycline  -Dressing: remain c/d/I until follow up next week Thursday  - F/u Plan: Pt stable for dc from my standpoint, follow up next week Thursday in [place], office to arrange. Will sign off.         Stacey Warner, DPM [facility] / [facility]

## 2024-01-09 NOTE — Plan of Care (Signed)
  Problem: Clinical Measurements: Goal: Ability to maintain clinical measurements within normal limits will improve Outcome: Progressing   Problem: Activity: Goal: Risk for activity intolerance will decrease Outcome: Progressing   Problem: Coping: Goal: Level of anxiety will decrease Outcome: Progressing   

## 2024-01-10 ENCOUNTER — Other Ambulatory Visit: Payer: Self-pay

## 2024-01-12 LAB — CULTURE, BLOOD (ROUTINE X 2)
Culture: NO GROWTH
Culture: NO GROWTH

## 2024-01-13 DIAGNOSIS — Z79891 Long term (current) use of opiate analgesic: Secondary | ICD-10-CM | POA: Diagnosis not present

## 2024-01-13 DIAGNOSIS — J309 Allergic rhinitis, unspecified: Secondary | ICD-10-CM | POA: Diagnosis not present

## 2024-01-13 DIAGNOSIS — Z79899 Other long term (current) drug therapy: Secondary | ICD-10-CM | POA: Diagnosis not present

## 2024-01-13 DIAGNOSIS — F0393 Unspecified dementia, unspecified severity, with mood disturbance: Secondary | ICD-10-CM | POA: Diagnosis not present

## 2024-01-13 DIAGNOSIS — S98211D Complete traumatic amputation of two or more right lesser toes, subsequent encounter: Secondary | ICD-10-CM | POA: Diagnosis not present

## 2024-01-13 DIAGNOSIS — E538 Deficiency of other specified B group vitamins: Secondary | ICD-10-CM | POA: Diagnosis not present

## 2024-01-13 DIAGNOSIS — F0392 Unspecified dementia, unspecified severity, with psychotic disturbance: Secondary | ICD-10-CM | POA: Diagnosis not present

## 2024-01-13 DIAGNOSIS — Z7952 Long term (current) use of systemic steroids: Secondary | ICD-10-CM | POA: Diagnosis not present

## 2024-01-13 DIAGNOSIS — F0394 Unspecified dementia, unspecified severity, with anxiety: Secondary | ICD-10-CM | POA: Diagnosis not present

## 2024-01-13 DIAGNOSIS — I10 Essential (primary) hypertension: Secondary | ICD-10-CM | POA: Diagnosis not present

## 2024-01-13 DIAGNOSIS — K5900 Constipation, unspecified: Secondary | ICD-10-CM | POA: Diagnosis not present

## 2024-01-13 DIAGNOSIS — N3281 Overactive bladder: Secondary | ICD-10-CM | POA: Diagnosis not present

## 2024-01-13 DIAGNOSIS — Z4781 Encounter for orthopedic aftercare following surgical amputation: Secondary | ICD-10-CM | POA: Diagnosis not present

## 2024-01-13 DIAGNOSIS — G894 Chronic pain syndrome: Secondary | ICD-10-CM | POA: Diagnosis not present

## 2024-01-13 DIAGNOSIS — Z556 Problems related to health literacy: Secondary | ICD-10-CM | POA: Diagnosis not present

## 2024-01-13 DIAGNOSIS — H409 Unspecified glaucoma: Secondary | ICD-10-CM | POA: Diagnosis not present

## 2024-01-13 DIAGNOSIS — K219 Gastro-esophageal reflux disease without esophagitis: Secondary | ICD-10-CM | POA: Diagnosis not present

## 2024-01-13 DIAGNOSIS — Z89421 Acquired absence of other right toe(s): Secondary | ICD-10-CM | POA: Diagnosis not present

## 2024-01-13 DIAGNOSIS — F32A Depression, unspecified: Secondary | ICD-10-CM | POA: Diagnosis not present

## 2024-01-13 LAB — AEROBIC/ANAEROBIC CULTURE W GRAM STAIN (SURGICAL/DEEP WOUND): Gram Stain: NONE SEEN

## 2024-01-13 LAB — SURGICAL PATHOLOGY

## 2024-01-15 ENCOUNTER — Ambulatory Visit (INDEPENDENT_AMBULATORY_CARE_PROVIDER_SITE_OTHER): Payer: Self-pay | Admitting: Podiatry

## 2024-01-15 DIAGNOSIS — F03918 Unspecified dementia, unspecified severity, with other behavioral disturbance: Secondary | ICD-10-CM | POA: Diagnosis not present

## 2024-01-15 DIAGNOSIS — Z89421 Acquired absence of other right toe(s): Secondary | ICD-10-CM

## 2024-01-15 DIAGNOSIS — F419 Anxiety disorder, unspecified: Secondary | ICD-10-CM | POA: Diagnosis not present

## 2024-01-15 DIAGNOSIS — F32A Depression, unspecified: Secondary | ICD-10-CM | POA: Diagnosis not present

## 2024-01-15 DIAGNOSIS — R5381 Other malaise: Secondary | ICD-10-CM | POA: Diagnosis not present

## 2024-01-15 DIAGNOSIS — H409 Unspecified glaucoma: Secondary | ICD-10-CM | POA: Diagnosis not present

## 2024-01-15 DIAGNOSIS — K219 Gastro-esophageal reflux disease without esophagitis: Secondary | ICD-10-CM | POA: Diagnosis not present

## 2024-01-15 DIAGNOSIS — K5900 Constipation, unspecified: Secondary | ICD-10-CM | POA: Diagnosis not present

## 2024-01-15 DIAGNOSIS — I10 Essential (primary) hypertension: Secondary | ICD-10-CM | POA: Diagnosis not present

## 2024-01-15 DIAGNOSIS — G894 Chronic pain syndrome: Secondary | ICD-10-CM | POA: Diagnosis not present

## 2024-01-15 DIAGNOSIS — E538 Deficiency of other specified B group vitamins: Secondary | ICD-10-CM | POA: Diagnosis not present

## 2024-01-15 DIAGNOSIS — M869 Osteomyelitis, unspecified: Secondary | ICD-10-CM | POA: Diagnosis not present

## 2024-01-15 DIAGNOSIS — R600 Localized edema: Secondary | ICD-10-CM | POA: Diagnosis not present

## 2024-01-15 NOTE — Progress Notes (Signed)
 Subjective:  Patient ID: Stacey Warner, female    DOB: 19-Sep-1932,  MRN: [id number]  Chief Complaint  Patient presents with   Routine Post Op    DOS: 01/08/2024 Procedure: Right third digit amputation  89 y.o. female returns for post-op check.  Patient states that she is doing well denies any other acute complaints.  Bandages clean dry and intact.  Ambulating with surgical shoe  Review of Systems: Negative except as noted in the HPI. Denies N/V/F/Ch.  Past Medical History:  Diagnosis Date   Allergy    Alzheimer dementia (HCC)    Arthritis    Cataract    GERD (gastroesophageal reflux disease)    Glaucoma    Heart murmur    Myocardial infarction (HCC)    Seizures (HCC)    Sleep apnea    Stroke [facility])     Current Outpatient Medications:    acetaminophen  (TYLENOL ) 325 MG tablet, Take 650 mg by mouth every 6 (six) hours as needed., Disp: , Rfl:    azelastine (ASTELIN) 0.1 % nasal spray, , Disp: , Rfl:    diclofenac Sodium (VOLTAREN) 1 % GEL, Apply 1 Application topically 4 (four) times daily., Disp: , Rfl:    dorzolamide-timolol (COSOPT) 22.3-6.8 MG/ML ophthalmic solution, 1 drop daily in the afternoon. (Patient not taking: Reported on 01/07/2024), Disp: , Rfl:    fludrocortisone (FLORINEF) 0.1 MG tablet, Take 0.1 mg by mouth daily., Disp: , Rfl:    fluticasone (FLONASE) 50 MCG/ACT nasal spray, 1 spray 2 (two) times daily at 10 AM and 5 PM., Disp: , Rfl:    furosemide (LASIX) 40 MG tablet, Take 40 mg by mouth daily., Disp: , Rfl:    latanoprost (XALATAN) 0.005 % ophthalmic solution, at bedtime., Disp: , Rfl:    LORazepam (ATIVAN) 0.5 MG tablet, Take 0.5 mg by mouth every 6 (six) hours as needed for anxiety., Disp: , Rfl:    mirtazapine (REMERON) 15 MG tablet, Take 15 mg by mouth at bedtime., Disp: , Rfl:    montelukast (SINGULAIR) 10 MG tablet, TAKE ONE TABLET DAILY EACH EVENING PRIOR TO BEDTIME, Disp: , Rfl: 10   Polyethylene Glycol 3350 (PEG 3350) 17 GM/SCOOP POWD, Take 17 g  by mouth daily., Disp: , Rfl:    risperiDONE (RISPERDAL) 0.25 MG tablet, Take 0.25 mg by mouth 2 (two) times daily., Disp: , Rfl:    sertraline (ZOLOFT) 50 MG tablet, Take 50 mg by mouth daily., Disp: , Rfl:    solifenacin  (VESICARE ) 5 MG tablet, Take 1 tablet (5 mg total) by mouth daily., Disp: 30 tablet, Rfl: 11   traMADol (ULTRAM) 50 MG tablet, Take 50 mg by mouth daily as needed., Disp: , Rfl:   Social History   Tobacco Use  Smoking Status Never   Passive exposure: Never  Smokeless Tobacco Never    Allergies  Allergen Reactions   Amoxicillin-Pot Clavulanate Rash   Ciprofloxacin      Other reaction(s): Unknown Pt doesn't remember   Clindamycin     Other reaction(s): Unknown   Minocycline     Other reaction(s): Unknown Pt states made her feel funny   Sucralfate     Other reaction(s): Other (See Comments) can't take   Sulfa Antibiotics Rash    Had a small area of rash about 30 years ago when she took sulfa, has not taken since.   Tape Rash   Objective:  There were no vitals filed for this visit. There is no height or weight on file to calculate  BMI. Constitutional Well developed. Well nourished.  Vascular Foot warm and well perfused. Capillary refill normal to all digits.   Neurologic Normal speech. Oriented to person, place, and time. Epicritic sensation to light touch grossly present bilaterally.  Dermatologic Skin healing well without signs of infection. Skin edges well coapted without signs of infection.  Orthopedic: Tenderness to palpation noted about the surgical site.   Radiographs: None Assessment:   1. History of amputation of lesser toe of right foot    Plan:  Patient was evaluated and treated and all questions answered.  S/p foot surgery right -Progressing as expected post-operatively. -XR: See above -WB Status: Weightbearing as tolerated in surgical shoe -Sutures: Intact.  No clinical signs of dehiscence noted no complication  noted. -Medications: None -Foot redressed.  No follow-ups on file.

## 2024-01-16 DIAGNOSIS — G309 Alzheimer's disease, unspecified: Secondary | ICD-10-CM | POA: Diagnosis not present

## 2024-01-16 DIAGNOSIS — F411 Generalized anxiety disorder: Secondary | ICD-10-CM | POA: Diagnosis not present

## 2024-01-16 DIAGNOSIS — F02B18 Dementia in other diseases classified elsewhere, moderate, with other behavioral disturbance: Secondary | ICD-10-CM | POA: Diagnosis not present

## 2024-01-16 DIAGNOSIS — F02B4 Dementia in other diseases classified elsewhere, moderate, with anxiety: Secondary | ICD-10-CM | POA: Diagnosis not present

## 2024-01-22 DIAGNOSIS — H409 Unspecified glaucoma: Secondary | ICD-10-CM | POA: Diagnosis not present

## 2024-01-22 DIAGNOSIS — E782 Mixed hyperlipidemia: Secondary | ICD-10-CM | POA: Diagnosis not present

## 2024-01-22 DIAGNOSIS — E559 Vitamin D deficiency, unspecified: Secondary | ICD-10-CM | POA: Diagnosis not present

## 2024-01-22 DIAGNOSIS — E038 Other specified hypothyroidism: Secondary | ICD-10-CM | POA: Diagnosis not present

## 2024-01-22 DIAGNOSIS — E119 Type 2 diabetes mellitus without complications: Secondary | ICD-10-CM | POA: Diagnosis not present

## 2024-01-22 DIAGNOSIS — D519 Vitamin B12 deficiency anemia, unspecified: Secondary | ICD-10-CM | POA: Diagnosis not present

## 2024-01-26 DIAGNOSIS — D519 Vitamin B12 deficiency anemia, unspecified: Secondary | ICD-10-CM | POA: Diagnosis not present

## 2024-01-26 DIAGNOSIS — E782 Mixed hyperlipidemia: Secondary | ICD-10-CM | POA: Diagnosis not present

## 2024-01-26 DIAGNOSIS — R7309 Other abnormal glucose: Secondary | ICD-10-CM | POA: Diagnosis not present

## 2024-01-26 DIAGNOSIS — Z79899 Other long term (current) drug therapy: Secondary | ICD-10-CM | POA: Diagnosis not present

## 2024-01-28 DIAGNOSIS — E782 Mixed hyperlipidemia: Secondary | ICD-10-CM | POA: Diagnosis not present

## 2024-01-28 DIAGNOSIS — E559 Vitamin D deficiency, unspecified: Secondary | ICD-10-CM | POA: Diagnosis not present

## 2024-01-28 DIAGNOSIS — H409 Unspecified glaucoma: Secondary | ICD-10-CM | POA: Diagnosis not present

## 2024-01-28 DIAGNOSIS — D519 Vitamin B12 deficiency anemia, unspecified: Secondary | ICD-10-CM | POA: Diagnosis not present

## 2024-01-28 DIAGNOSIS — E119 Type 2 diabetes mellitus without complications: Secondary | ICD-10-CM | POA: Diagnosis not present

## 2024-01-28 DIAGNOSIS — E038 Other specified hypothyroidism: Secondary | ICD-10-CM | POA: Diagnosis not present

## 2024-01-29 ENCOUNTER — Ambulatory Visit: Admitting: Podiatry

## 2024-01-29 ENCOUNTER — Encounter: Payer: Self-pay | Admitting: Podiatry

## 2024-01-29 DIAGNOSIS — I999 Unspecified disorder of circulatory system: Secondary | ICD-10-CM

## 2024-01-29 DIAGNOSIS — Z89421 Acquired absence of other right toe(s): Secondary | ICD-10-CM

## 2024-01-29 DIAGNOSIS — M205X2 Other deformities of toe(s) (acquired), left foot: Secondary | ICD-10-CM

## 2024-01-29 NOTE — Progress Notes (Signed)
 Subjective:  Patient ID: Stacey Warner, female    DOB: [birth year],  MRN: [id number]  Chief Complaint  Patient presents with   Routine Post Op    History of amputation of lesser toe of right foot     DOS: 01/08/2024 Procedure: Right third digit amputation  89 y.o. female returns for post-op check.  Patient states that she is doing well denies any other acute complaints.  Doing pretty well.  Bandages clean dry and intact.  Patient also has secondary complaint left second digit toe contracture denies any other acute issues  Review of Systems: Negative except as noted in the HPI. Denies N/V/F/Ch.  Past Medical History:  Diagnosis Date   Allergy    Alzheimer dementia (HCC)    Arthritis    Cataract    GERD (gastroesophageal reflux disease)    Glaucoma    Heart murmur    Myocardial infarction (HCC)    Seizures (HCC)    Sleep apnea    Stroke [facility])     Current Outpatient Medications:    acetaminophen  (TYLENOL ) 325 MG tablet, Take 650 mg by mouth every 6 (six) hours as needed., Disp: , Rfl:    azelastine (ASTELIN) 0.1 % nasal spray, , Disp: , Rfl:    diclofenac Sodium (VOLTAREN) 1 % GEL, Apply 1 Application topically 4 (four) times daily., Disp: , Rfl:    dorzolamide-timolol (COSOPT) 22.3-6.8 MG/ML ophthalmic solution, 1 drop daily in the afternoon. (Patient not taking: Reported on 01/07/2024), Disp: , Rfl:    fludrocortisone (FLORINEF) 0.1 MG tablet, Take 0.1 mg by mouth daily., Disp: , Rfl:    fluticasone (FLONASE) 50 MCG/ACT nasal spray, 1 spray 2 (two) times daily at 10 AM and 5 PM., Disp: , Rfl:    furosemide (LASIX) 40 MG tablet, Take 40 mg by mouth daily., Disp: , Rfl:    latanoprost (XALATAN) 0.005 % ophthalmic solution, at bedtime., Disp: , Rfl:    LORazepam (ATIVAN) 0.5 MG tablet, Take 0.5 mg by mouth every 6 (six) hours as needed for anxiety., Disp: , Rfl:    mirtazapine (REMERON) 15 MG tablet, Take 15 mg by mouth at bedtime., Disp: , Rfl:    montelukast (SINGULAIR) 10  MG tablet, TAKE ONE TABLET DAILY EACH EVENING PRIOR TO BEDTIME, Disp: , Rfl: 10   Polyethylene Glycol 3350 (PEG 3350) 17 GM/SCOOP POWD, Take 17 g by mouth daily., Disp: , Rfl:    risperiDONE (RISPERDAL) 0.25 MG tablet, Take 0.25 mg by mouth 2 (two) times daily., Disp: , Rfl:    sertraline (ZOLOFT) 50 MG tablet, Take 50 mg by mouth daily., Disp: , Rfl:    solifenacin  (VESICARE ) 5 MG tablet, Take 1 tablet (5 mg total) by mouth daily., Disp: 30 tablet, Rfl: 11   traMADol (ULTRAM) 50 MG tablet, Take 50 mg by mouth daily as needed., Disp: , Rfl:   Social History   Tobacco Use  Smoking Status Never   Passive exposure: Never  Smokeless Tobacco Never    Allergies  Allergen Reactions   Amoxicillin-Pot Clavulanate Rash   Ciprofloxacin      Other reaction(s): Unknown Pt doesn't remember   Clindamycin     Other reaction(s): Unknown   Minocycline     Other reaction(s): Unknown Pt states made her feel funny   Sucralfate     Other reaction(s): Other (See Comments) can't take   Sulfa Antibiotics Rash    Had a small area of rash about 30 years ago when she took sulfa, has not  taken since.   Tape Rash   Objective:  There were no vitals filed for this visit. There is no height or weight on file to calculate BMI. Constitutional Well developed. Well nourished.  Vascular Foot warm and well perfused. Capillary refill normal to all digits.   Neurologic Normal speech. Oriented to person, place, and time. Epicritic sensation to light touch grossly present bilaterally.  Dermatologic Skin completely reepithelialized.  No signs of dehiscence or no complication noted.  Left second digit toe contracture noted.  Pain on palpation semiflexible in nature  Orthopedic: Tenderness to palpation noted about the surgical site.   Radiographs: None Assessment:   1. Vascular abnormality   2. History of amputation of lesser toe of right foot   3. Toe contracture, left    Plan:  Patient was evaluated and  treated and all questions answered.  S/p foot surgery right - Clinically healed and officially discharged from my care if any foot and ankle issues on future she will come back and see me.  Left second digit toe contracture - I discussed with the patient etiology of toe contracture versus treatment options were discussed.  Patient will benefit from tenotomy of the second digit in the future after obtaining ABIs PVRs patient was the order was placed for ABIs PVRs  No follow-ups on file.

## 2024-02-02 ENCOUNTER — Telehealth (INDEPENDENT_AMBULATORY_CARE_PROVIDER_SITE_OTHER): Payer: Self-pay

## 2024-02-02 NOTE — Telephone Encounter (Signed)
 Patient sister Aleesia called stating that she has major surgery in a few days and wants to coordinate her sisters appointment with us  as soon as possible, please return a call with status of her referral.

## 2024-02-05 ENCOUNTER — Telehealth: Payer: Self-pay | Admitting: Lab

## 2024-02-05 ENCOUNTER — Ambulatory Visit (INDEPENDENT_AMBULATORY_CARE_PROVIDER_SITE_OTHER)

## 2024-02-05 DIAGNOSIS — I999 Unspecified disorder of circulatory system: Secondary | ICD-10-CM

## 2024-02-05 NOTE — Telephone Encounter (Signed)
 [facility] calling wants to know if you can increase weightbearing status so patient can get up moving around please call and advise [phone].

## 2024-02-09 LAB — VAS US ABI WITH/WO TBI
Left ABI: 1.15
Right ABI: 0.96

## 2024-02-10 DIAGNOSIS — R7309 Other abnormal glucose: Secondary | ICD-10-CM | POA: Diagnosis not present

## 2024-02-10 DIAGNOSIS — Z79899 Other long term (current) drug therapy: Secondary | ICD-10-CM | POA: Diagnosis not present

## 2024-02-10 DIAGNOSIS — D519 Vitamin B12 deficiency anemia, unspecified: Secondary | ICD-10-CM | POA: Diagnosis not present

## 2024-02-10 DIAGNOSIS — E782 Mixed hyperlipidemia: Secondary | ICD-10-CM | POA: Diagnosis not present

## 2024-02-13 DIAGNOSIS — F02B18 Dementia in other diseases classified elsewhere, moderate, with other behavioral disturbance: Secondary | ICD-10-CM | POA: Diagnosis not present

## 2024-02-13 DIAGNOSIS — F02B4 Dementia in other diseases classified elsewhere, moderate, with anxiety: Secondary | ICD-10-CM | POA: Diagnosis not present

## 2024-02-13 DIAGNOSIS — G309 Alzheimer's disease, unspecified: Secondary | ICD-10-CM | POA: Diagnosis not present

## 2024-02-13 DIAGNOSIS — F411 Generalized anxiety disorder: Secondary | ICD-10-CM | POA: Diagnosis not present

## 2024-02-16 ENCOUNTER — Ambulatory Visit: Admitting: Urology

## 2024-02-26 DIAGNOSIS — L309 Dermatitis, unspecified: Secondary | ICD-10-CM | POA: Diagnosis not present

## 2024-02-26 DIAGNOSIS — N3281 Overactive bladder: Secondary | ICD-10-CM | POA: Diagnosis not present

## 2024-02-26 DIAGNOSIS — K5900 Constipation, unspecified: Secondary | ICD-10-CM | POA: Diagnosis not present

## 2024-02-26 DIAGNOSIS — G894 Chronic pain syndrome: Secondary | ICD-10-CM | POA: Diagnosis not present

## 2024-02-26 DIAGNOSIS — R5381 Other malaise: Secondary | ICD-10-CM | POA: Diagnosis not present

## 2024-02-26 DIAGNOSIS — I10 Essential (primary) hypertension: Secondary | ICD-10-CM | POA: Diagnosis not present

## 2024-02-26 DIAGNOSIS — R600 Localized edema: Secondary | ICD-10-CM | POA: Diagnosis not present

## 2024-02-26 DIAGNOSIS — F32A Depression, unspecified: Secondary | ICD-10-CM | POA: Diagnosis not present

## 2024-02-26 DIAGNOSIS — H409 Unspecified glaucoma: Secondary | ICD-10-CM | POA: Diagnosis not present

## 2024-02-26 DIAGNOSIS — E538 Deficiency of other specified B group vitamins: Secondary | ICD-10-CM | POA: Diagnosis not present

## 2024-02-26 DIAGNOSIS — F419 Anxiety disorder, unspecified: Secondary | ICD-10-CM | POA: Diagnosis not present

## 2024-02-26 DIAGNOSIS — K219 Gastro-esophageal reflux disease without esophagitis: Secondary | ICD-10-CM | POA: Diagnosis not present

## 2024-03-01 DIAGNOSIS — Z79899 Other long term (current) drug therapy: Secondary | ICD-10-CM | POA: Diagnosis not present

## 2024-04-05 ENCOUNTER — Ambulatory Visit: Admitting: Urology

## 2024-05-31 ENCOUNTER — Ambulatory Visit: Admitting: Urology
